# Patient Record
Sex: Female | Born: 1940 | Race: White | Hispanic: No | Marital: Married | Smoking: Never smoker
Health system: Southern US, Community
[De-identification: ages and names within clinical notes are randomized; demographics above are authoritative.]

## PROBLEM LIST (undated history)

## (undated) DIAGNOSIS — E279 Disorder of adrenal gland, unspecified: Secondary | ICD-10-CM

## (undated) DIAGNOSIS — I5189 Other ill-defined heart diseases: Secondary | ICD-10-CM

## (undated) DIAGNOSIS — C50919 Malignant neoplasm of unspecified site of unspecified female breast: Secondary | ICD-10-CM

## (undated) DIAGNOSIS — C4490 Unspecified malignant neoplasm of skin, unspecified: Secondary | ICD-10-CM

## (undated) DIAGNOSIS — J45909 Unspecified asthma, uncomplicated: Secondary | ICD-10-CM

## (undated) DIAGNOSIS — Z923 Personal history of irradiation: Secondary | ICD-10-CM

## (undated) DIAGNOSIS — A419 Sepsis, unspecified organism: Secondary | ICD-10-CM

## (undated) DIAGNOSIS — N200 Calculus of kidney: Secondary | ICD-10-CM

## (undated) DIAGNOSIS — E039 Hypothyroidism, unspecified: Secondary | ICD-10-CM

## (undated) DIAGNOSIS — I2510 Atherosclerotic heart disease of native coronary artery without angina pectoris: Secondary | ICD-10-CM

## (undated) DIAGNOSIS — K8050 Calculus of bile duct without cholangitis or cholecystitis without obstruction: Secondary | ICD-10-CM

## (undated) DIAGNOSIS — Z803 Family history of malignant neoplasm of breast: Secondary | ICD-10-CM

## (undated) DIAGNOSIS — Z87442 Personal history of urinary calculi: Secondary | ICD-10-CM

## (undated) DIAGNOSIS — I499 Cardiac arrhythmia, unspecified: Secondary | ICD-10-CM

## (undated) DIAGNOSIS — Z808 Family history of malignant neoplasm of other organs or systems: Secondary | ICD-10-CM

## (undated) DIAGNOSIS — Z7982 Long term (current) use of aspirin: Secondary | ICD-10-CM

## (undated) DIAGNOSIS — K219 Gastro-esophageal reflux disease without esophagitis: Secondary | ICD-10-CM

## (undated) DIAGNOSIS — Z8719 Personal history of other diseases of the digestive system: Secondary | ICD-10-CM

## (undated) DIAGNOSIS — Z8489 Family history of other specified conditions: Secondary | ICD-10-CM

## (undated) DIAGNOSIS — I10 Essential (primary) hypertension: Secondary | ICD-10-CM

## (undated) DIAGNOSIS — G4733 Obstructive sleep apnea (adult) (pediatric): Secondary | ICD-10-CM

## (undated) DIAGNOSIS — Z8042 Family history of malignant neoplasm of prostate: Secondary | ICD-10-CM

## (undated) DIAGNOSIS — C679 Malignant neoplasm of bladder, unspecified: Secondary | ICD-10-CM

## (undated) DIAGNOSIS — T884XXA Failed or difficult intubation, initial encounter: Secondary | ICD-10-CM

## (undated) DIAGNOSIS — B029 Zoster without complications: Secondary | ICD-10-CM

## (undated) DIAGNOSIS — R600 Localized edema: Secondary | ICD-10-CM

## (undated) DIAGNOSIS — E785 Hyperlipidemia, unspecified: Secondary | ICD-10-CM

## (undated) DIAGNOSIS — Z8052 Family history of malignant neoplasm of bladder: Secondary | ICD-10-CM

## (undated) DIAGNOSIS — E669 Obesity, unspecified: Secondary | ICD-10-CM

## (undated) DIAGNOSIS — G4730 Sleep apnea, unspecified: Secondary | ICD-10-CM

## (undated) DIAGNOSIS — N179 Acute kidney failure, unspecified: Secondary | ICD-10-CM

## (undated) DIAGNOSIS — K851 Biliary acute pancreatitis without necrosis or infection: Secondary | ICD-10-CM

## (undated) DIAGNOSIS — E278 Other specified disorders of adrenal gland: Secondary | ICD-10-CM

## (undated) DIAGNOSIS — M549 Dorsalgia, unspecified: Secondary | ICD-10-CM

## (undated) DIAGNOSIS — M1990 Unspecified osteoarthritis, unspecified site: Secondary | ICD-10-CM

## (undated) DIAGNOSIS — I700 Atherosclerosis of aorta: Secondary | ICD-10-CM

## (undated) DIAGNOSIS — I447 Left bundle-branch block, unspecified: Secondary | ICD-10-CM

## (undated) HISTORY — DX: Family history of malignant neoplasm of prostate: Z80.42

## (undated) HISTORY — DX: Family history of malignant neoplasm of breast: Z80.3

## (undated) HISTORY — DX: Family history of malignant neoplasm of bladder: Z80.52

## (undated) HISTORY — PX: SEPTOPLASTY: SHX2393

## (undated) HISTORY — PX: CARPAL TUNNEL RELEASE: SHX101

## (undated) HISTORY — DX: Calculus of bile duct without cholangitis or cholecystitis without obstruction: K80.50

## (undated) HISTORY — PX: CATARACT EXTRACTION: SUR2

## (undated) HISTORY — PX: KNEE ARTHROSCOPY: SUR90

## (undated) HISTORY — DX: Family history of malignant neoplasm of other organs or systems: Z80.8

## (undated) HISTORY — DX: Biliary acute pancreatitis without necrosis or infection: K85.10

## (undated) HISTORY — PX: UVULOPALATOPHARYNGOPLASTY: SHX827

## (undated) HISTORY — PX: TONSILLECTOMY: SUR1361

---

## 2003-04-08 DIAGNOSIS — C4490 Unspecified malignant neoplasm of skin, unspecified: Secondary | ICD-10-CM

## 2003-04-08 HISTORY — DX: Unspecified malignant neoplasm of skin, unspecified: C44.90

## 2003-10-13 ENCOUNTER — Other Ambulatory Visit: Payer: Self-pay

## 2004-07-30 ENCOUNTER — Ambulatory Visit: Payer: Self-pay | Admitting: Gastroenterology

## 2004-08-06 ENCOUNTER — Ambulatory Visit: Payer: Self-pay | Admitting: Internal Medicine

## 2004-08-12 ENCOUNTER — Ambulatory Visit: Payer: Self-pay | Admitting: Internal Medicine

## 2004-10-17 ENCOUNTER — Ambulatory Visit: Payer: Self-pay | Admitting: Urology

## 2004-12-10 ENCOUNTER — Ambulatory Visit: Payer: Self-pay | Admitting: Physician Assistant

## 2005-03-14 ENCOUNTER — Ambulatory Visit: Payer: Self-pay | Admitting: Internal Medicine

## 2005-06-16 ENCOUNTER — Ambulatory Visit: Payer: Self-pay | Admitting: Obstetrics and Gynecology

## 2006-06-23 ENCOUNTER — Ambulatory Visit: Payer: Self-pay | Admitting: Internal Medicine

## 2006-07-06 ENCOUNTER — Ambulatory Visit: Payer: Self-pay | Admitting: Internal Medicine

## 2007-01-13 ENCOUNTER — Ambulatory Visit: Payer: Self-pay | Admitting: Internal Medicine

## 2007-07-30 ENCOUNTER — Ambulatory Visit: Payer: Self-pay | Admitting: Internal Medicine

## 2008-07-19 ENCOUNTER — Ambulatory Visit: Payer: Self-pay | Admitting: Internal Medicine

## 2008-09-11 ENCOUNTER — Ambulatory Visit: Payer: Self-pay | Admitting: Urology

## 2009-01-03 ENCOUNTER — Ambulatory Visit: Payer: Self-pay | Admitting: Internal Medicine

## 2010-01-28 ENCOUNTER — Ambulatory Visit: Payer: Self-pay | Admitting: Internal Medicine

## 2010-07-29 ENCOUNTER — Ambulatory Visit: Payer: Self-pay | Admitting: General Practice

## 2010-08-09 ENCOUNTER — Ambulatory Visit: Payer: Self-pay | Admitting: General Practice

## 2010-11-27 ENCOUNTER — Ambulatory Visit: Payer: Self-pay | Admitting: Ophthalmology

## 2011-02-03 ENCOUNTER — Ambulatory Visit: Payer: Self-pay | Admitting: Internal Medicine

## 2011-04-08 DIAGNOSIS — Z923 Personal history of irradiation: Secondary | ICD-10-CM

## 2011-04-08 DIAGNOSIS — C50919 Malignant neoplasm of unspecified site of unspecified female breast: Secondary | ICD-10-CM

## 2011-04-08 HISTORY — DX: Personal history of irradiation: Z92.3

## 2011-04-08 HISTORY — PX: TOTAL HIP ARTHROPLASTY: SHX124

## 2011-04-08 HISTORY — PX: BREAST BIOPSY: SHX20

## 2011-04-08 HISTORY — PX: JOINT REPLACEMENT: SHX530

## 2011-04-08 HISTORY — DX: Malignant neoplasm of unspecified site of unspecified female breast: C50.919

## 2011-04-08 HISTORY — PX: BREAST LUMPECTOMY: SHX2

## 2011-06-09 ENCOUNTER — Emergency Department: Payer: Self-pay | Admitting: Emergency Medicine

## 2011-06-10 LAB — COMPREHENSIVE METABOLIC PANEL
Albumin: 3.4 g/dL (ref 3.4–5.0)
Alkaline Phosphatase: 77 U/L (ref 50–136)
Calcium, Total: 8 mg/dL — ABNORMAL LOW (ref 8.5–10.1)
Chloride: 96 mmol/L — ABNORMAL LOW (ref 98–107)
Co2: 26 mmol/L (ref 21–32)
Creatinine: 1.38 mg/dL — ABNORMAL HIGH (ref 0.60–1.30)
Glucose: 175 mg/dL — ABNORMAL HIGH (ref 65–99)
SGOT(AST): 49 U/L — ABNORMAL HIGH (ref 15–37)
SGPT (ALT): 55 U/L
Total Protein: 7.4 g/dL (ref 6.4–8.2)

## 2011-06-10 LAB — URINALYSIS, COMPLETE
Bilirubin,UR: NEGATIVE
Nitrite: NEGATIVE
Ph: 5 (ref 4.5–8.0)
Protein: 30
RBC,UR: 25 /HPF (ref 0–5)
Squamous Epithelial: 8
Transitional Epi: 2
WBC UR: 30 /HPF (ref 0–5)

## 2011-06-10 LAB — CBC
HCT: 43.9 % (ref 35.0–47.0)
HGB: 14.7 g/dL (ref 12.0–16.0)
MCH: 30.9 pg (ref 26.0–34.0)
MCHC: 33.5 g/dL (ref 32.0–36.0)
RDW: 14.2 % (ref 11.5–14.5)

## 2011-06-10 LAB — RAPID INFLUENZA A&B ANTIGENS

## 2012-02-11 ENCOUNTER — Ambulatory Visit: Payer: Self-pay | Admitting: Internal Medicine

## 2012-02-25 ENCOUNTER — Ambulatory Visit: Payer: Self-pay | Admitting: Internal Medicine

## 2012-03-15 ENCOUNTER — Ambulatory Visit: Payer: Self-pay | Admitting: Surgery

## 2012-03-15 DIAGNOSIS — D0512 Intraductal carcinoma in situ of left breast: Secondary | ICD-10-CM

## 2012-03-15 HISTORY — DX: Intraductal carcinoma in situ of left breast: D05.12

## 2012-03-22 LAB — PATHOLOGY REPORT

## 2012-03-25 ENCOUNTER — Ambulatory Visit: Payer: Self-pay | Admitting: Surgery

## 2012-04-05 ENCOUNTER — Ambulatory Visit: Payer: Self-pay | Admitting: Surgery

## 2012-04-07 ENCOUNTER — Ambulatory Visit: Payer: Self-pay | Admitting: Oncology

## 2012-04-22 ENCOUNTER — Ambulatory Visit: Payer: Self-pay | Admitting: Oncology

## 2012-05-08 ENCOUNTER — Ambulatory Visit: Payer: Self-pay | Admitting: Oncology

## 2012-05-14 LAB — CBC CANCER CENTER
Basophil %: 0.6 %
Eosinophil %: 3.2 %
Lymphocyte #: 1.7 x10 3/mm (ref 1.0–3.6)
MCH: 30.9 pg (ref 26.0–34.0)
MCHC: 33.8 g/dL (ref 32.0–36.0)
Neutrophil #: 6.1 x10 3/mm (ref 1.4–6.5)
Neutrophil %: 70.2 %
Platelet: 268 x10 3/mm (ref 150–440)
RDW: 13.6 % (ref 11.5–14.5)

## 2012-05-21 LAB — CBC CANCER CENTER
Basophil %: 0.5 %
HGB: 14.2 g/dL (ref 12.0–16.0)
MCH: 30.3 pg (ref 26.0–34.0)
MCV: 91 fL (ref 80–100)
Monocyte #: 0.5 x10 3/mm (ref 0.2–0.9)
Platelet: 279 x10 3/mm (ref 150–440)
RBC: 4.69 10*6/uL (ref 3.80–5.20)

## 2012-05-28 LAB — CBC CANCER CENTER
Basophil #: 0 x10 3/mm (ref 0.0–0.1)
Basophil %: 0.4 %
Eosinophil #: 0.4 x10 3/mm (ref 0.0–0.7)
HCT: 41.8 % (ref 35.0–47.0)
Lymphocyte #: 1.4 x10 3/mm (ref 1.0–3.6)
Lymphocyte %: 18 %
MCH: 30.4 pg (ref 26.0–34.0)
MCHC: 33.2 g/dL (ref 32.0–36.0)
Monocyte #: 0.6 x10 3/mm (ref 0.2–0.9)
Neutrophil %: 68.7 %
Platelet: 279 x10 3/mm (ref 150–440)
RBC: 4.57 10*6/uL (ref 3.80–5.20)
WBC: 7.7 x10 3/mm (ref 3.6–11.0)

## 2012-06-04 LAB — CBC CANCER CENTER
Basophil %: 0.3 %
Eosinophil #: 0.4 x10 3/mm (ref 0.0–0.7)
Eosinophil %: 4.9 %
HCT: 40 % (ref 35.0–47.0)
HGB: 13.5 g/dL (ref 12.0–16.0)
Lymphocyte #: 1.1 x10 3/mm (ref 1.0–3.6)
Lymphocyte %: 15.9 %
MCH: 30.7 pg (ref 26.0–34.0)
MCV: 91 fL (ref 80–100)
Neutrophil #: 5.1 x10 3/mm (ref 1.4–6.5)
Neutrophil %: 71.4 %
Platelet: 244 x10 3/mm (ref 150–440)
RBC: 4.39 10*6/uL (ref 3.80–5.20)
RDW: 14.3 % (ref 11.5–14.5)
WBC: 7.1 x10 3/mm (ref 3.6–11.0)

## 2012-06-05 ENCOUNTER — Ambulatory Visit: Payer: Self-pay | Admitting: Oncology

## 2012-06-18 LAB — CBC CANCER CENTER
Basophil #: 0 x10 3/mm (ref 0.0–0.1)
Basophil %: 0.3 %
Eosinophil #: 0.4 x10 3/mm (ref 0.0–0.7)
Eosinophil %: 5.9 %
HCT: 42.1 % (ref 35.0–47.0)
HGB: 14.2 g/dL (ref 12.0–16.0)
Lymphocyte #: 0.8 x10 3/mm — ABNORMAL LOW (ref 1.0–3.6)
MCHC: 33.7 g/dL (ref 32.0–36.0)
Monocyte #: 0.5 x10 3/mm (ref 0.2–0.9)
Monocyte %: 8.1 %
Neutrophil #: 4.7 x10 3/mm (ref 1.4–6.5)
Platelet: 257 x10 3/mm (ref 150–440)
RBC: 4.6 10*6/uL (ref 3.80–5.20)
WBC: 6.4 x10 3/mm (ref 3.6–11.0)

## 2012-06-25 LAB — CBC CANCER CENTER
Basophil #: 0 x10 3/mm (ref 0.0–0.1)
Basophil %: 0.3 %
Eosinophil #: 0.4 x10 3/mm (ref 0.0–0.7)
Lymphocyte #: 0.7 x10 3/mm — ABNORMAL LOW (ref 1.0–3.6)
MCHC: 34.4 g/dL (ref 32.0–36.0)
MCV: 91 fL (ref 80–100)
Monocyte #: 0.5 x10 3/mm (ref 0.2–0.9)
Neutrophil #: 4.3 x10 3/mm (ref 1.4–6.5)
Neutrophil %: 72.3 %
RDW: 14.2 % (ref 11.5–14.5)

## 2012-07-06 ENCOUNTER — Ambulatory Visit: Payer: Self-pay | Admitting: Oncology

## 2012-09-14 ENCOUNTER — Ambulatory Visit: Payer: Self-pay | Admitting: Oncology

## 2012-11-15 ENCOUNTER — Ambulatory Visit: Payer: Self-pay | Admitting: Oncology

## 2012-12-06 ENCOUNTER — Ambulatory Visit: Payer: Self-pay | Admitting: Oncology

## 2013-01-05 ENCOUNTER — Ambulatory Visit: Payer: Self-pay | Admitting: Oncology

## 2013-02-15 ENCOUNTER — Ambulatory Visit: Payer: Self-pay | Admitting: Internal Medicine

## 2013-05-23 ENCOUNTER — Ambulatory Visit: Payer: Self-pay | Admitting: Oncology

## 2013-06-08 ENCOUNTER — Ambulatory Visit: Payer: Self-pay | Admitting: Oncology

## 2013-06-08 LAB — CBC CANCER CENTER
Basophil #: 0.1 x10 3/mm (ref 0.0–0.1)
Basophil %: 0.9 %
Eosinophil #: 0.3 x10 3/mm (ref 0.0–0.7)
Eosinophil %: 3.6 %
HCT: 42.5 % (ref 35.0–47.0)
HGB: 13.9 g/dL (ref 12.0–16.0)
Lymphocyte #: 1.7 x10 3/mm (ref 1.0–3.6)
Lymphocyte %: 21.8 %
MCH: 30.2 pg (ref 26.0–34.0)
MCHC: 32.7 g/dL (ref 32.0–36.0)
MCV: 92 fL (ref 80–100)
MONO ABS: 0.7 x10 3/mm (ref 0.2–0.9)
MONOS PCT: 9 %
NEUTROS PCT: 64.7 %
Neutrophil #: 5.1 x10 3/mm (ref 1.4–6.5)
Platelet: 274 x10 3/mm (ref 150–440)
RBC: 4.6 10*6/uL (ref 3.80–5.20)
RDW: 14 % (ref 11.5–14.5)
WBC: 8 x10 3/mm (ref 3.6–11.0)

## 2013-06-08 LAB — COMPREHENSIVE METABOLIC PANEL
ALBUMIN: 3.6 g/dL (ref 3.4–5.0)
ALT: 48 U/L (ref 12–78)
Alkaline Phosphatase: 102 U/L
Anion Gap: 8 (ref 7–16)
BILIRUBIN TOTAL: 0.3 mg/dL (ref 0.2–1.0)
BUN: 16 mg/dL (ref 7–18)
CHLORIDE: 102 mmol/L (ref 98–107)
CREATININE: 1.14 mg/dL (ref 0.60–1.30)
Calcium, Total: 8.6 mg/dL (ref 8.5–10.1)
Co2: 33 mmol/L — ABNORMAL HIGH (ref 21–32)
EGFR (African American): 55 — ABNORMAL LOW
EGFR (Non-African Amer.): 47 — ABNORMAL LOW
GLUCOSE: 120 mg/dL — AB (ref 65–99)
OSMOLALITY: 287 (ref 275–301)
Potassium: 3.9 mmol/L (ref 3.5–5.1)
SGOT(AST): 34 U/L (ref 15–37)
Sodium: 143 mmol/L (ref 136–145)
TOTAL PROTEIN: 7.4 g/dL (ref 6.4–8.2)

## 2013-07-06 ENCOUNTER — Ambulatory Visit: Payer: Self-pay | Admitting: Oncology

## 2013-12-14 ENCOUNTER — Ambulatory Visit: Payer: Self-pay | Admitting: Oncology

## 2013-12-14 LAB — COMPREHENSIVE METABOLIC PANEL
ALT: 56 U/L
ANION GAP: 10 (ref 7–16)
Albumin: 3.6 g/dL (ref 3.4–5.0)
Alkaline Phosphatase: 111 U/L
BILIRUBIN TOTAL: 0.4 mg/dL (ref 0.2–1.0)
BUN: 13 mg/dL (ref 7–18)
CHLORIDE: 101 mmol/L (ref 98–107)
CREATININE: 1.18 mg/dL (ref 0.60–1.30)
Calcium, Total: 8.8 mg/dL (ref 8.5–10.1)
Co2: 32 mmol/L (ref 21–32)
EGFR (African American): 52 — ABNORMAL LOW
EGFR (Non-African Amer.): 45 — ABNORMAL LOW
GLUCOSE: 111 mg/dL — AB (ref 65–99)
OSMOLALITY: 286 (ref 275–301)
Potassium: 4 mmol/L (ref 3.5–5.1)
SGOT(AST): 37 U/L (ref 15–37)
SODIUM: 143 mmol/L (ref 136–145)
TOTAL PROTEIN: 7.7 g/dL (ref 6.4–8.2)

## 2013-12-14 LAB — CBC CANCER CENTER
Basophil #: 0.1 x10 3/mm (ref 0.0–0.1)
Basophil %: 0.7 %
EOS ABS: 0.4 x10 3/mm (ref 0.0–0.7)
Eosinophil %: 4.2 %
HCT: 44.4 % (ref 35.0–47.0)
HGB: 14.5 g/dL (ref 12.0–16.0)
Lymphocyte #: 1.8 x10 3/mm (ref 1.0–3.6)
Lymphocyte %: 20.3 %
MCH: 30.5 pg (ref 26.0–34.0)
MCHC: 32.7 g/dL (ref 32.0–36.0)
MCV: 93 fL (ref 80–100)
MONO ABS: 0.7 x10 3/mm (ref 0.2–0.9)
Monocyte %: 7.6 %
NEUTROS PCT: 67.2 %
Neutrophil #: 6 x10 3/mm (ref 1.4–6.5)
PLATELETS: 285 x10 3/mm (ref 150–440)
RBC: 4.77 10*6/uL (ref 3.80–5.20)
RDW: 14.2 % (ref 11.5–14.5)
WBC: 8.9 x10 3/mm (ref 3.6–11.0)

## 2014-01-05 ENCOUNTER — Ambulatory Visit: Payer: Self-pay | Admitting: Oncology

## 2014-03-07 ENCOUNTER — Ambulatory Visit: Payer: Self-pay | Admitting: Internal Medicine

## 2014-06-14 ENCOUNTER — Ambulatory Visit
Admit: 2014-06-14 | Disposition: A | Discharge status: Home / Self Care | Payer: Self-pay | Attending: Oncology | Admitting: Oncology

## 2014-07-07 ENCOUNTER — Ambulatory Visit
Admit: 2014-07-07 | Disposition: A | Discharge status: Home / Self Care | Payer: Self-pay | Attending: Oncology | Admitting: Oncology

## 2014-07-21 ENCOUNTER — Other Ambulatory Visit: Payer: Self-pay | Admitting: Oncology

## 2014-07-21 DIAGNOSIS — Z853 Personal history of malignant neoplasm of breast: Secondary | ICD-10-CM

## 2014-07-28 NOTE — Consult Note (Signed)
Reason for Visit: This 74 year old Female patient presents to the clinic for initial evaluation of  breast cancer .   Referred by Dr. Oliva Bustard.  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   Pathology Report pathology report reviewed    Imaging Report mammogram reviewed    Referral Report linical notes reviewed    Planned Treatment Regimen adjuvant whole breast radiation    HPI   patient is a 74 year old female who presents with an abnormal mammogram of her left breast showing new microcalcifications clusters in the left breast. 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 She then underwent a wide local excision for a 3.5 cm lesion for ductal carcinoma in situ. Margins were clear but close at 1 mm. She is done well postoperatively. She's been evaluated by medical oncology and she will be on tamoxifen after completion of radiation. She specifically denies breast tenderness cough or bone pain at this time.  Past Hx:    Left Breast Cancer:    Right Breast Cancer:    Sleep Apnea/CPAP:    Hypertension:    Left Hip Replacement:    Carpal Tunnel Release, Right:    Cataract Surgery Left Eye:    Excision squamousncell carcinoma X 4:    Cataract surgery right eye:    Left hip replacement: Jul 2008   Right knee arthroscopy: 2005   Surgery throat and tongue: 1999  Past, Family and Social History:   Past Medical History positive    Cardiovascular hypertension    Respiratory sleep apnea    Past Surgical History cataract right eye, left hip replacement, right knee arthroscopic, head and neck surgery 1999    Family History positive    Family History Comments mother with breast cancer brother with colon polyps    Social History noncontributory    Additional Past Medical and  Surgical History accompanied by husband today   Allergies:   Sulfa drugs: N/V/Diarrhea  Levaquin: Other  Vasotec: Unknown  Home Meds:  Home Medications: Medication Instructions Status  calcium-vitamin D 600 mg-400 intl units tablet 1 tab(s) orally once a day Active  levothyroxine 75 mcg (0.05 mg) tablet 1 tab(s)  once a day Active  Vitamin B12 1000 mcg oral tablet 1 tab(s) orally once a day Active  Geophysicist/field seismologist Therapeutic Multiple Vitamins with Minerals tablet 1 tab(s) orally once a day  Active  furosemide 20 mg tablet 1 tab(s) orally once a day as needed   Active  lovastatin 40 mg tablet 1 tab(s) orally once a day (at bedtime)  Active  pantoprazole 40 mg delayed release tablet 1 tab(s) orally once a day (at bedtime)  Active  hydrochlorothiazide 25 mg tablet 1 tab(s) orally once a day  Active  losartan 100 mg tablet 1 tab(s) orally once a day (in the morning)  Active  Metoprolol Tartrate 100 mg tablet 1 tab(s) orally 2 times a day  Active  Aspirin Low Dose 81 mg tablet 1 tab(s) orally once a day (at bedtime)  Active  Klor-Con 10 10 mEq tablet, extended release 1 tab(s) orally once a day as needed  when taking furosemide Active  Vitamin D3 1000 intl units oral tablet 1 tab(s) orally once a day  Active  Klor-Con M20 oral tablet, extended release 1 tab(s) orally 2 times a day  Active  loratadine 10 mg tablet 1 tab(s) orally once a day  Active  amLODIPine 10 mg tablet 1 tab(s) orally once a day (in the morning)  Active  Omega 3 Fish Oil 1 cap(s) orally once a day  Active   Review of Systems:   General negative    Performance Status (ECOG) 0    Skin negative    Breast see HPI    Ophthalmologic negative    ENMT negative    Respiratory and Thorax negative    Cardiovascular negative    Gastrointestinal negative    Genitourinary negative    Musculoskeletal negative    Neurological negative    Psychiatric negative    Hematology/Lymphatics negative    Endocrine  negative    Allergic/Immunologic negative    Review of Systems   according to nurse's notesPatient denies any weight loss, fatigue, weakness, fever, chills or night sweats. Patient denies any loss of vision, blurred vision. Patient denies any ringing  of the ears or hearing loss. No irregular heartbeat. Patient denies heart murmur or history of fainting. Patient denies any chest pain or pain radiating to her upper extremities. Patient denies any shortness of breath, difficulty breathing at night, cough or hemoptysis. Patient denies any swelling in the lower legs. Patient denies any nausea vomiting, vomiting of blood, or coffee ground material in the vomitus. Patient denies any stomach pain. Patient states has had normal bowel movements no significant constipation or diarrhea. Patient denies any dysuria, hematuria or significant nocturia. Patient denies any problems walking, swelling in the joints or loss of balance. Patient denies any skin changes, loss of hair or loss of weight. Patient denies any excessive worrying or anxiety or significant depression. Patient denies any problems with insomnia. Patient denies excessive thirst, polyuria, polydipsia. Patient denies any swollen glands, patient denies easy bruising or easy bleeding. Patient denies any recent infections, allergies or URI. Patient "s visual fields have not changed significantly in recent time.  Nursing Notes:  Nursing Vital Signs and Chemo Nursing Nursing Notes: *CC Vital Signs Flowsheet:   23-Jan-14 13:34   Temp Temperature 97.7   Pulse Pulse 65   Respirations Respirations 20   SBP SBP 148   DBP DBP 52   Pain Scale (0-10)  0   Current Weight (kg) (kg) 94.2   Height (cm) centimeters 155   BSA (m2) 1.9   Physical Exam:  General/Skin/HEENT:   General normal    Skin normal    Eyes normal    ENMT normal    Head and Neck normal    Additional PE well-developed slightly obese female in NAD. She status post wide local excision  the peri-areolar region of the left breast. Incision is well-healed. No dominant mass or nodularity is noted in either breast into position examined. No axillary or supraclavicular adenopathy is appreciated. Lungs are clear to A&P cardiac examination shows regular rate and rhythm.   Breasts/Resp/CV/GI/GU:   Respiratory and Thorax normal    Cardiovascular normal    Gastrointestinal normal    Genitourinary normal   MS/Neuro/Psych/Lymph:   Musculoskeletal normal    Neurological normal    Lymphatics normal   Assessment and Plan:  Impression:   ductal carcinoma in situ of the left breast status post wide local excision for ER/PR-positive tumor in 74 year old female  Plan:   based on the large volume of DCIS and close margin believe she would best be benefit from whole breast radiation. Would plan on delivering 5000 cGy to her  left breast and boost or scar another 1600 cGy using electron beam based on the close margin. Risks and benefits of treatment were reviewed with the patient and her husband and both seem to comprehend my treatment plan well. Side effects including skin changes, fibrosis of the breast, inclusion of some normal superficial lung, alteration blood counts, and fatigue were all explained in detail to the patient. I have set her up for CT simulation next week.she will be a candidate for tamoxifen after completion of radiation therapy we will refer her back to Dr. Oliva Bustard for that.  I would like to take this opportunity to thank you for allowing me to continue to participate in this patient's care.    CC Referral:   cc: Dr. Tamala Julian, Dr. Ramonita Lab   Electronic Signatures: Baruch Gouty Roda Shutters (MD)  (Signed 23-Jan-14 15:24)  Authored: HPI, Diagnosis, Past Hx, PFSH, Allergies, Home Meds, ROS, Nursing Notes, Physical Exam, Encounter Assessment and Plan, CC Referring Physician   Last Updated: 23-Jan-14 15:24 by Armstead Peaks (MD)

## 2014-07-28 NOTE — Op Note (Signed)
PATIENT NAME:  Alyssa Duncan, Alyssa Duncan MR#:  [id number] DATE OF BIRTH:  Dec 08, 1940  DATE OF PROCEDURE:  04/05/2012  PREOPERATIVE DIAGNOSIS: Carcinoma of the left breast.   POSTOPERATIVE DIAGNOSIS: Carcinoma of the left breast.   PROCEDURE: Left partial mastectomy.   SURGEON: Rochel Brome, MD  ANESTHESIA: General.   INDICATION: This 74 year old female recently had an abnormal mammogram and had needle biopsy findings of ductal carcinoma in situ and lobular carcinoma in situ. Surgery was recommended for definitive treatment. She had preoperative x-ray needle localization with insertion of a Kopans wire which entered the breast, at the medial aspect of the breast.   DESCRIPTION OF PROCEDURE: With the patient on the operating table, in the supine position, under general anesthesia, the dressing was removed from the left breast exposing the Kopans wire. The wire was cut 2 cm from the skin and the breast was prepared with ChloraPrep and draped in a sterile manner. The mammogram images were reviewed on a light box in the operating room prior to surgery demonstrating location of the biopsy marker and the Kopans wire.   A curvilinear incision was made, in the medial aspect, of the left breast adjacent to the border of the areola and the incision extended from the 7 o'clock to the 11 o'clock position and also approximately 1 cm width of skin was removed in the course of this dissection. Dissection was carried down through subcutaneous tissues and was carried laterally down to the wire and as the dissection approached the wire there was some firmness appreciated within the breast surrounding the thick portion of the wire. Further dissection was carried out removing a block of breast tissue surrounding the wire while palpating the area of firmness and there was one point where the dissection came close to the wire and backed up and extended more laterally and dissected completely around the wire and removed the sample of  tissue. It is further noted that the 11 o'clock end of the skin ellipse was tagged with a 3-0 nylon suture. There was one point where the dissection had been carried down close to the biopsy site and backed up and the tissues were reapproximated at this point with a 3-0 nylon stitch to give the pathologist a better idea of the margins. Also the margins were tagged with the margin map placing the margin map markers on the specimen with 3-0 nylon sutures to mark the cranial, caudal, medial, lateral and deep margins. The specimen was submitted for specimen mammogram and pathology for gross inspection to check for margins. The wound was inspected. There was no remaining evidence of any tumor within the wound. It is noted that the tumor did extend somewhat posterior to the nipple. Next, several small bleeding points were cauterized. One clamped vessel was suture-ligated with 4-0 chromic. Deeper tissues were infiltrated with 0.5% Sensorcaine with epinephrine and also subcutaneous tissues were infiltrated and the wound was further inspected. There was no visible or palpable mass remaining and hemostasis was subsequently intact. Next, portions of the breast tissue were approximated with 4-0 chromic and tissues posterior to the nipple were approximated with 4-0 chromic to help prevent architectural distortion and help prevent inversion. Next, the wound was closed with a running 4-0 Monocryl subcuticular suture.   The pathologist called back at a later time and had studied and identified the biopsy site. There was some nodularity in some surface areas although it was unclear whether there was any involvement of margins and specimen was deferred for routine pathology.  Next, the skin was treated with Dermabond. The patient tolerated the procedure satisfactorily and was prepared for transfer to the recovery room.  ____________________________ Lenna Sciara. Rochel Brome, MD jws:sb D: 04/06/2012 09:25:28 ET T: 04/06/2012 10:28:39  ET JOB#: [id number]  cc: Loreli Dollar, MD, <Dictator> Loreli Dollar MD ELECTRONICALLY SIGNED 04/08/2012 16:08

## 2015-03-12 ENCOUNTER — Other Ambulatory Visit: Payer: Self-pay

## 2015-03-22 ENCOUNTER — Other Ambulatory Visit: Payer: Self-pay | Admitting: Oncology

## 2015-03-22 ENCOUNTER — Ambulatory Visit
Admission: RE | Admit: 2015-03-22 | Discharge: 2015-03-22 | Disposition: A | Discharge status: Home / Self Care | Payer: Medicare PPO | Source: Ambulatory Visit | Attending: Oncology | Admitting: Oncology

## 2015-03-22 DIAGNOSIS — Z853 Personal history of malignant neoplasm of breast: Secondary | ICD-10-CM

## 2015-03-22 HISTORY — DX: Unspecified malignant neoplasm of skin, unspecified: C44.90

## 2015-03-22 HISTORY — DX: Malignant neoplasm of unspecified site of unspecified female breast: C50.919

## 2015-04-08 DIAGNOSIS — I480 Paroxysmal atrial fibrillation: Secondary | ICD-10-CM

## 2015-04-08 HISTORY — DX: Paroxysmal atrial fibrillation: I48.0

## 2015-05-09 ENCOUNTER — Encounter: Payer: Self-pay | Admitting: *Deleted

## 2015-05-10 ENCOUNTER — Ambulatory Visit
Admission: RE | Admit: 2015-05-10 | Discharge: 2015-05-10 | Disposition: A | Discharge status: Home / Self Care | Payer: Medicare Other | Source: Ambulatory Visit | Attending: Gastroenterology | Admitting: Gastroenterology

## 2015-05-10 ENCOUNTER — Ambulatory Visit: Payer: Medicare Other | Admitting: Anesthesiology

## 2015-05-10 ENCOUNTER — Encounter: Payer: Self-pay | Admitting: *Deleted

## 2015-05-10 ENCOUNTER — Encounter
Admission: RE | Disposition: A | Discharge status: Home / Self Care | Payer: Self-pay | Source: Ambulatory Visit | Attending: Gastroenterology

## 2015-05-10 DIAGNOSIS — Z85828 Personal history of other malignant neoplasm of skin: Secondary | ICD-10-CM | POA: Diagnosis not present

## 2015-05-10 DIAGNOSIS — E785 Hyperlipidemia, unspecified: Secondary | ICD-10-CM | POA: Diagnosis not present

## 2015-05-10 DIAGNOSIS — Z853 Personal history of malignant neoplasm of breast: Secondary | ICD-10-CM | POA: Insufficient documentation

## 2015-05-10 DIAGNOSIS — E039 Hypothyroidism, unspecified: Secondary | ICD-10-CM | POA: Insufficient documentation

## 2015-05-10 DIAGNOSIS — Z79899 Other long term (current) drug therapy: Secondary | ICD-10-CM | POA: Diagnosis not present

## 2015-05-10 DIAGNOSIS — M1990 Unspecified osteoarthritis, unspecified site: Secondary | ICD-10-CM | POA: Insufficient documentation

## 2015-05-10 DIAGNOSIS — Z9660 Presence of unspecified orthopedic joint implant: Secondary | ICD-10-CM | POA: Insufficient documentation

## 2015-05-10 DIAGNOSIS — I10 Essential (primary) hypertension: Secondary | ICD-10-CM | POA: Insufficient documentation

## 2015-05-10 DIAGNOSIS — E119 Type 2 diabetes mellitus without complications: Secondary | ICD-10-CM | POA: Diagnosis not present

## 2015-05-10 DIAGNOSIS — G4730 Sleep apnea, unspecified: Secondary | ICD-10-CM | POA: Insufficient documentation

## 2015-05-10 DIAGNOSIS — K219 Gastro-esophageal reflux disease without esophagitis: Secondary | ICD-10-CM | POA: Diagnosis not present

## 2015-05-10 DIAGNOSIS — J45909 Unspecified asthma, uncomplicated: Secondary | ICD-10-CM | POA: Diagnosis not present

## 2015-05-10 DIAGNOSIS — Z1211 Encounter for screening for malignant neoplasm of colon: Secondary | ICD-10-CM | POA: Insufficient documentation

## 2015-05-10 DIAGNOSIS — D122 Benign neoplasm of ascending colon: Secondary | ICD-10-CM | POA: Diagnosis not present

## 2015-05-10 HISTORY — DX: Gastro-esophageal reflux disease without esophagitis: K21.9

## 2015-05-10 HISTORY — DX: Zoster without complications: B02.9

## 2015-05-10 HISTORY — PX: COLONOSCOPY WITH PROPOFOL: SHX5780

## 2015-05-10 HISTORY — DX: Sleep apnea, unspecified: G47.30

## 2015-05-10 HISTORY — DX: Hyperlipidemia, unspecified: E78.5

## 2015-05-10 HISTORY — DX: Unspecified osteoarthritis, unspecified site: M19.90

## 2015-05-10 HISTORY — DX: Unspecified asthma, uncomplicated: J45.909

## 2015-05-10 HISTORY — DX: Dorsalgia, unspecified: M54.9

## 2015-05-10 HISTORY — DX: Essential (primary) hypertension: I10

## 2015-05-10 HISTORY — DX: Hypothyroidism, unspecified: E03.9

## 2015-05-10 SURGERY — COLONOSCOPY WITH PROPOFOL
Anesthesia: General

## 2015-05-10 MED ORDER — SODIUM CHLORIDE 0.9 % IV SOLN
INTRAVENOUS | Status: DC
  Administered 2015-05-10: 1000 mL via INTRAVENOUS

## 2015-05-10 MED ORDER — PROPOFOL 500 MG/50ML IV EMUL
INTRAVENOUS | Status: DC | PRN
  Administered 2015-05-10: 100 ug/kg/min via INTRAVENOUS

## 2015-05-10 MED ORDER — LIDOCAINE HCL (CARDIAC) 20 MG/ML IV SOLN
INTRAVENOUS | Status: DC | PRN
  Administered 2015-05-10: 30 mg via INTRAVENOUS

## 2015-05-10 MED ORDER — SODIUM CHLORIDE 0.9 % IV SOLN: INTRAVENOUS | Status: DC

## 2015-05-10 MED ORDER — PROPOFOL 10 MG/ML IV BOLUS
INTRAVENOUS | Status: DC | PRN
  Administered 2015-05-10: 100 mg via INTRAVENOUS
  Administered 2015-05-10: 40 mg via INTRAVENOUS

## 2015-05-10 NOTE — H&P (Signed)
Primary Care Physician:  Adin Hector, MD Primary Gastroenterologist:  Dr. Candace Cruise  Pre-Procedure History & Physical: HPI:  Alyssa Duncan is a 75 y.o. female is here for an colonoscopy  Past Medical History  Diagnosis Date  . Breast cancer ([facility]) 2013    left breast ductal and lobular carcinoma  . Skin cancer   . Arthritis   . Asthma   . Back pain   . Hypertension   . GERD (gastroesophageal reflux disease)   . Diabetes mellitus without complication ([place])   . Hyperlipidemia   . Sleep apnea   . Hypothyroidism   . Shingles     Past Surgical History  Procedure Laterality Date  . Breast lumpectomy Left 2013    with rad tx  . Uvulopalatopharyngoplasty      and tongue surgery  . Septoplasty    . Joint replacement    . Knee arthroscopy    . Cataract extraction    . Mastectomy    . Carpal tunnel release      Prior to Admission medications   Medication Sig Start Date End Date Taking? Authorizing Provider  calcium carbonate (OSCAL) 1500 (600 Ca) MG TABS tablet Take 1,500 mg by mouth 2 (two) times daily with a meal.   Yes Historical Provider, MD  DORZOLAMIDE HCL-TIMOLOL MAL OP Place 1 drop into both eyes 2 (two) times daily.   Yes Historical Provider, MD  exemestane (AROMASIN) 25 MG tablet Take 25 mg by mouth daily after breakfast.   Yes Historical Provider, MD  furosemide (LASIX) 20 MG tablet Take 20 mg by mouth daily as needed.   Yes Historical Provider, MD  hydrochlorothiazide (HYDRODIURIL) 25 MG tablet Take 25 mg by mouth daily.   Yes Historical Provider, MD  ibuprofen (ADVIL,MOTRIN) 200 MG tablet Take 200 mg by mouth every 6 (six) hours as needed.   Yes Historical Provider, MD  levothyroxine (SYNTHROID, LEVOTHROID) 75 MCG tablet Take 75 mcg by mouth daily before breakfast.   Yes Historical Provider, MD  loratadine (CLARITIN) 10 MG tablet Take 10 mg by mouth daily.   Yes Historical Provider, MD  losartan (COZAAR) 100 MG tablet Take 100 mg by mouth daily.   Yes Historical  Provider, MD  lovastatin (MEVACOR) 40 MG tablet Take 40 mg by mouth at bedtime.   Yes Historical Provider, MD  metoprolol succinate (TOPROL-XL) 100 MG 24 hr tablet Take 100 mg by mouth 2 (two) times daily. Take with or immediately following a meal.   Yes Historical Provider, MD  Multiple Vitamin (MULTIVITAMIN) tablet Take 1 tablet by mouth daily.   Yes Historical Provider, MD  pantoprazole (PROTONIX) 40 MG tablet Take 40 mg by mouth daily.   Yes Historical Provider, MD  potassium chloride SA (K-DUR,KLOR-CON) 20 MEQ tablet Take 20 mEq by mouth 2 (two) times daily.   Yes Historical Provider, MD    Allergies as of 04/30/2015  . (Not on File)    Family History  Problem Relation Age of Onset  . Breast cancer Mother 63    Social History   Social History  . Marital Status: Married    Spouse Name: N/A  . Number of Children: N/A  . Years of Education: N/A   Occupational History  . Not on file.   Social History Main Topics  . Smoking status: Never Smoker   . Smokeless tobacco: Never Used  . Alcohol Use: No  . Drug Use: No  . Sexual Activity: Not on file  Other Topics Concern  . Not on file   Social History Narrative    Review of Systems: See HPI, otherwise negative ROS  Physical Exam: BP 143/71 mmHg  Pulse 65  Temp(Src) 97.6 F (36.4 C) (Tympanic)  Resp 18  Ht 5\' 2"  (1.575 m)  Wt 91.173 kg (201 lb)  BMI 36.75 kg/m2  SpO2 98% General:   Alert,  pleasant and cooperative in NAD Head:  Normocephalic and atraumatic. Neck:  Supple; no masses or thyromegaly. Lungs:  Clear throughout to auscultation.    Heart:  Regular rate and rhythm. Abdomen:  Soft, nontender and nondistended. Normal bowel sounds, without guarding, and without rebound.   Neurologic:  Alert and  oriented x4;  grossly normal neurologically.  Impression/Plan: Alyssa Duncan is here for an colonoscopy to be performed for screening.  Risks, benefits, limitations, and alternatives regarding colonoscopy}  have been reviewed with the patient.  Questions have been answered.  All parties agreeable.   Hiran Leard, Lupita Dawn, MD  05/10/2015, 9:59 AM

## 2015-05-10 NOTE — Transfer of Care (Signed)
Immediate Anesthesia Transfer of Care Note  Patient: Alyssa Duncan  Procedure(s) Performed: Procedure(s): COLONOSCOPY WITH PROPOFOL (N/A)  Patient Location: Endoscopy Unit  Anesthesia Type:General  Level of Consciousness: sedated  Airway & Oxygen Therapy: Patient Spontanous Breathing and Patient connected to nasal cannula oxygen  Post-op Assessment: Report given to RN and Post -op Vital signs reviewed and stable  Post vital signs: Reviewed and stable  Last Vitals:  Filed Vitals:   05/10/15 0934 05/10/15 1034  BP: 143/71 106/41  Pulse: 65 68  Temp: 36.4 C 36 C  Resp: 18 19    Complications: No apparent anesthesia complications

## 2015-05-10 NOTE — Op Note (Signed)
[facility] Gastroenterology Patient Name: Alyssa Duncan Procedure Date: 05/10/2015 9:56 AM MRN: [id number] Account #: [id number] Date of Birth: 06/05/1940 Admit Type: Outpatient Age: 75 Room: [facility] ENDO ROOM 4 Gender: Female Note Status: Finalized Procedure:         Colonoscopy Indications:       Screening for colorectal malignant neoplasm Providers:         Lupita Dawn. Candace Cruise, MD Referring MD:      Ramonita Lab, MD (Referring MD) Medicines:         Monitored Anesthesia Care Complications:     No immediate complications. Procedure:         Pre-Anesthesia Assessment:                    - Prior to the procedure, a History and Physical was                     performed, and patient medications, allergies and                     sensitivities were reviewed. The patient's tolerance of                     previous anesthesia was reviewed.                    - The risks and benefits of the procedure and the sedation                     options and risks were discussed with the patient. All                     questions were answered and informed consent was obtained.                    - After reviewing the risks and benefits, the patient was                     deemed in satisfactory condition to undergo the procedure.                    After obtaining informed consent, the colonoscope was                     passed under direct vision. Throughout the procedure, the                     patient's blood pressure, pulse, and oxygen saturations                     were monitored continuously. The Colonoscope was                     introduced through the anus and advanced to the the cecum,                     identified by appendiceal orifice and ileocecal valve. The                     colonoscopy was performed without difficulty. The patient                     tolerated the procedure well. The quality of the bowel  preparation was good. Findings:      A small  polyp was found in the ascending colon. The polyp was sessile.       The polyp was removed with a cold snare. Resection and retrieval were       complete.      The exam was otherwise without abnormality. Impression:        - One small polyp in the ascending colon. Resected and                     retrieved.                    - The examination was otherwise normal. Recommendation:    - Discharge patient to home.                    - Await pathology results.                    - Repeat colonoscopy in 5 years for surveillance based on                     pathology results.                    - The findings and recommendations were discussed with the                     patient. Procedure Code(s): --- Professional ---                    [phone], Colonoscopy, flexible; with removal of tumor(s),                     polyp(s), or other lesion(s) by snare technique Diagnosis Code(s): --- Professional ---                    Z12.11, Encounter for screening for malignant neoplasm of                     colon                    D12.2, Benign neoplasm of ascending colon CPT copyright 2014 American Medical Association. All rights reserved. The codes documented in this report are preliminary and upon coder review may  be revised to meet current compliance requirements. Hulen Luster, MD 05/10/2015 10:33:11 AM This report has been signed electronically. Number of Addenda: 0 Note Initiated On: 05/10/2015 9:56 AM Scope Withdrawal Time: 0 hours 9 minutes 7 seconds  Total Procedure Duration: 0 hours 18 minutes 4 seconds       [facility]

## 2015-05-10 NOTE — Anesthesia Postprocedure Evaluation (Signed)
Anesthesia Post Note  Patient: Alyssa Duncan  Procedure(s) Performed: Procedure(s) (LRB): COLONOSCOPY WITH PROPOFOL (N/A)  Patient location during evaluation: Endoscopy Anesthesia Type: General Level of consciousness: awake and alert Pain management: pain level controlled Vital Signs Assessment: post-procedure vital signs reviewed and stable Respiratory status: spontaneous breathing, nonlabored ventilation, respiratory function stable and patient connected to nasal cannula oxygen Cardiovascular status: blood pressure returned to baseline and stable Postop Assessment: no signs of nausea or vomiting Anesthetic complications: no    Last Vitals:  Filed Vitals:   05/10/15 1100 05/10/15 1110  BP: 104/44 105/48  Pulse:    Temp:    Resp:      Last Pain: There were no vitals filed for this visit.               Martha Clan

## 2015-05-10 NOTE — Anesthesia Preprocedure Evaluation (Signed)
Anesthesia Evaluation  Patient identified by MRN, date of birth, ID band Patient awake    Reviewed: Allergy & Precautions, H&P , NPO status , Patient's Chart, lab work & pertinent test results, reviewed documented beta blocker date and time   Airway Mallampati: III  TM Distance: >3 FB Neck ROM: full    Dental no notable dental hx. (+) Caps   Pulmonary shortness of breath and with exertion, asthma , sleep apnea and Continuous Positive Airway Pressure Ventilation , neg COPD, neg recent URI,    Pulmonary exam normal breath sounds clear to auscultation       Cardiovascular Exercise Tolerance: Good hypertension, On Medications and On Home Beta Blockers (-) angina(-) CAD, (-) Past MI, (-) Cardiac Stents and (-) CABG Normal cardiovascular exam(-) dysrhythmias (-) Valvular Problems/Murmurs Rhythm:regular Rate:Normal     Neuro/Psych negative neurological ROS  negative psych ROS   GI/Hepatic Neg liver ROS, GERD  Medicated,  Endo/Other  diabetes (Borderline)Hypothyroidism   Renal/GU negative Renal ROS  negative genitourinary   Musculoskeletal   Abdominal   Peds  Hematology negative hematology ROS (+)   Anesthesia Other Findings Past Medical History:   Breast cancer ([place])                             2013           Comment:left breast ductal and lobular carcinoma   Skin cancer                                                  Arthritis                                                    Asthma                                                       Back pain                                                    Hypertension                                                 GERD (gastroesophageal reflux disease)                       Diabetes mellitus without complication ([facility])                 Hyperlipidemia  Sleep apnea                                                  Hypothyroidism                                                Shingles                                                     Reproductive/Obstetrics negative OB ROS                             Anesthesia Physical Anesthesia Plan  ASA: III  Anesthesia Plan: General   Post-op Pain Management:    Induction:   Airway Management Planned:   Additional Equipment:   Intra-op Plan:   Post-operative Plan:   Informed Consent: I have reviewed the patients History and Physical, chart, labs and discussed the procedure including the risks, benefits and alternatives for the proposed anesthesia with the patient or authorized representative who has indicated his/her understanding and acceptance.   Dental Advisory Given  Plan Discussed with: Anesthesiologist, CRNA and Surgeon  Anesthesia Plan Comments:         Anesthesia Quick Evaluation

## 2015-05-11 ENCOUNTER — Encounter: Payer: Self-pay | Admitting: Gastroenterology

## 2015-05-11 LAB — SURGICAL PATHOLOGY

## 2015-05-17 DIAGNOSIS — Z9889 Other specified postprocedural states: Secondary | ICD-10-CM | POA: Insufficient documentation

## 2015-05-17 DIAGNOSIS — Z4889 Encounter for other specified surgical aftercare: Secondary | ICD-10-CM | POA: Insufficient documentation

## 2015-06-13 ENCOUNTER — Ambulatory Visit: Payer: Self-pay | Admitting: Oncology

## 2015-06-13 ENCOUNTER — Inpatient Hospital Stay: Payer: Medicare Other

## 2015-06-13 ENCOUNTER — Inpatient Hospital Stay: Payer: Medicare Other | Attending: Oncology | Admitting: Oncology

## 2015-06-13 ENCOUNTER — Encounter: Payer: Self-pay | Admitting: Oncology

## 2015-06-13 ENCOUNTER — Inpatient Hospital Stay: Payer: Medicare Other | Admitting: Oncology

## 2015-06-13 ENCOUNTER — Other Ambulatory Visit: Payer: Self-pay | Admitting: *Deleted

## 2015-06-13 VITALS — BP 155/92 | HR 67 | Temp 98.2°F | Resp 18 | Wt 206.4 lb

## 2015-06-13 DIAGNOSIS — E039 Hypothyroidism, unspecified: Secondary | ICD-10-CM | POA: Insufficient documentation

## 2015-06-13 DIAGNOSIS — J45909 Unspecified asthma, uncomplicated: Secondary | ICD-10-CM | POA: Diagnosis not present

## 2015-06-13 DIAGNOSIS — Z85828 Personal history of other malignant neoplasm of skin: Secondary | ICD-10-CM

## 2015-06-13 DIAGNOSIS — I10 Essential (primary) hypertension: Secondary | ICD-10-CM | POA: Diagnosis not present

## 2015-06-13 DIAGNOSIS — Z79810 Long term (current) use of selective estrogen receptor modulators (SERMs): Secondary | ICD-10-CM | POA: Diagnosis not present

## 2015-06-13 DIAGNOSIS — Z803 Family history of malignant neoplasm of breast: Secondary | ICD-10-CM

## 2015-06-13 DIAGNOSIS — E119 Type 2 diabetes mellitus without complications: Secondary | ICD-10-CM | POA: Diagnosis not present

## 2015-06-13 DIAGNOSIS — R739 Hyperglycemia, unspecified: Secondary | ICD-10-CM | POA: Insufficient documentation

## 2015-06-13 DIAGNOSIS — Z79899 Other long term (current) drug therapy: Secondary | ICD-10-CM | POA: Diagnosis not present

## 2015-06-13 DIAGNOSIS — K219 Gastro-esophageal reflux disease without esophagitis: Secondary | ICD-10-CM | POA: Diagnosis not present

## 2015-06-13 DIAGNOSIS — Z7982 Long term (current) use of aspirin: Secondary | ICD-10-CM

## 2015-06-13 DIAGNOSIS — Z170 Estrogen receptor positive status [ER+]: Secondary | ICD-10-CM | POA: Diagnosis not present

## 2015-06-13 DIAGNOSIS — D0512 Intraductal carcinoma in situ of left breast: Secondary | ICD-10-CM | POA: Insufficient documentation

## 2015-06-13 DIAGNOSIS — J309 Allergic rhinitis, unspecified: Secondary | ICD-10-CM | POA: Insufficient documentation

## 2015-06-13 DIAGNOSIS — M549 Dorsalgia, unspecified: Secondary | ICD-10-CM | POA: Diagnosis not present

## 2015-06-13 DIAGNOSIS — C50919 Malignant neoplasm of unspecified site of unspecified female breast: Secondary | ICD-10-CM

## 2015-06-13 DIAGNOSIS — G4730 Sleep apnea, unspecified: Secondary | ICD-10-CM | POA: Diagnosis not present

## 2015-06-13 DIAGNOSIS — E785 Hyperlipidemia, unspecified: Secondary | ICD-10-CM | POA: Diagnosis not present

## 2015-06-13 DIAGNOSIS — G4710 Hypersomnia, unspecified: Secondary | ICD-10-CM | POA: Insufficient documentation

## 2015-06-13 DIAGNOSIS — R600 Localized edema: Secondary | ICD-10-CM | POA: Insufficient documentation

## 2015-06-13 DIAGNOSIS — M129 Arthropathy, unspecified: Secondary | ICD-10-CM | POA: Diagnosis not present

## 2015-06-13 DIAGNOSIS — M1990 Unspecified osteoarthritis, unspecified site: Secondary | ICD-10-CM | POA: Insufficient documentation

## 2015-06-13 DIAGNOSIS — C801 Malignant (primary) neoplasm, unspecified: Secondary | ICD-10-CM | POA: Insufficient documentation

## 2015-06-13 LAB — CBC WITH DIFFERENTIAL/PLATELET
Basophils Absolute: 0.1 10*3/uL (ref 0–0.1)
Basophils Relative: 1 %
Eosinophils Absolute: 0.4 10*3/uL (ref 0–0.7)
Eosinophils Relative: 5 %
HEMATOCRIT: 40.8 % (ref 35.0–47.0)
HEMOGLOBIN: 14.1 g/dL (ref 12.0–16.0)
LYMPHS ABS: 1.7 10*3/uL (ref 1.0–3.6)
LYMPHS PCT: 21 %
MCH: 31 pg (ref 26.0–34.0)
MCHC: 34.6 g/dL (ref 32.0–36.0)
MCV: 89.7 fL (ref 80.0–100.0)
MONOS PCT: 8 %
Monocytes Absolute: 0.7 10*3/uL (ref 0.2–0.9)
NEUTROS PCT: 65 %
Neutro Abs: 5.3 10*3/uL (ref 1.4–6.5)
Platelets: 301 10*3/uL (ref 150–440)
RBC: 4.55 MIL/uL (ref 3.80–5.20)
RDW: 13.8 % (ref 11.5–14.5)
WBC: 8.1 10*3/uL (ref 3.6–11.0)

## 2015-06-13 LAB — COMPREHENSIVE METABOLIC PANEL
ALK PHOS: 98 U/L (ref 38–126)
ALT: 27 U/L (ref 14–54)
ANION GAP: 7 (ref 5–15)
AST: 26 U/L (ref 15–41)
Albumin: 3.8 g/dL (ref 3.5–5.0)
BUN: 15 mg/dL (ref 6–20)
CALCIUM: 8.5 mg/dL — AB (ref 8.9–10.3)
CHLORIDE: 97 mmol/L — AB (ref 101–111)
CO2: 29 mmol/L (ref 22–32)
CREATININE: 0.9 mg/dL (ref 0.44–1.00)
Glucose, Bld: 126 mg/dL — ABNORMAL HIGH (ref 65–99)
Potassium: 3.3 mmol/L — ABNORMAL LOW (ref 3.5–5.1)
Sodium: 133 mmol/L — ABNORMAL LOW (ref 135–145)
Total Bilirubin: 0.7 mg/dL (ref 0.3–1.2)
Total Protein: 7.5 g/dL (ref 6.5–8.1)

## 2015-06-13 MED ORDER — EXEMESTANE 25 MG PO TABS: 25.0000 mg | ORAL_TABLET | Freq: Every day | ORAL | Status: DC

## 2015-06-13 NOTE — Progress Notes (Signed)
[place] @ [facility] [phone]  Fax:([phone] [place]: December 01, 1940  MR#: [id number]  JZP#:[id number]  Patient Care Team: Adin Hector, MD as PCP - General (Internal Medicine)  CHIEF COMPLAINT:  Chief Complaint  Patient presents with  . Breast Cancer     No history exists.   Chief Complaint/Diagnosis:   4. 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 2.  Patient had a joint pains with letrozole and had swelling due to tamoxifen. 3.  Aromasin was tried, November 16, 2012 No flowsheet data found.  INTERVAL HISTORY:75 year old lady with a history of carcinoma of breast stage 0.  Taking Aromasin calcium and vitamin D tolerating treatment very well. Bony pain no bony fracture.  Patient had a mammogram done in December of 2016 reported to be BI-RADS 2 REVIEW OF SYSTEMS:   GENERAL:  Feels good.  Active.  No fevers, sweats or weight loss. PERFORMANCE STATUS (ECOG): 0 HEENT:  No visual changes, runny nose, sore throat, mouth sores or tenderness. Lungs: No shortness of breath or cough.  No hemoptysis. Cardiac:  No chest pain, palpitations, orthopnea, or PND. GI:  No nausea, vomiting, diarrhea, constipation, melena or hematochezia. GU:  No urgency, frequency, dysuria, or hematuria. Musculoskeletal:  No back pain.  No joint pain.  No muscle tenderness. Extremities:  No pain or swelling. Skin:  No rashes or skin changes. Neuro:  No headache, numbness or weakness, balance or coordination issues. Endocrine:  No diabetes, thyroid issues, hot flashes or night sweats. Psych:  No mood changes, depression or anxiety. Pain:  No focal pain. Review of systems:  All other systems reviewed and found to be negative. As per HPI. Otherwise, a complete review of systems is negatve.  PAST MEDICAL HISTORY: Past Medical History  Diagnosis Date  . Breast cancer ([place]) 2013    left  breast ductal and lobular carcinoma  . Skin cancer   . Arthritis   . Asthma   . Back pain   . Hypertension   . GERD (gastroesophageal reflux disease)   . Diabetes mellitus without complication ([place])   . Hyperlipidemia   . Sleep apnea   . Hypothyroidism   . Shingles     PAST SURGICAL HISTORY: Past Surgical History  Procedure Laterality Date  . Breast lumpectomy Left 2013    with rad tx  . Uvulopalatopharyngoplasty      and tongue surgery  . Septoplasty    . Joint replacement    . Knee arthroscopy    . Cataract extraction    . Mastectomy    . Carpal tunnel release    . Colonoscopy with propofol N/A 05/10/2015    Procedure: COLONOSCOPY WITH PROPOFOL;  Surgeon: Hulen Luster, MD;  Location: [facility] ENDOSCOPY;  Service: Gastroenterology;  Laterality: N/A;    FAMILY HISTORY Family History  Problem Relation Age of Onset  . Breast cancer Mother 65    ADVANCED DIRECTIVES:  No flowsheet data found.  HEALTH MAINTENANCE: Social History  Substance Use Topics  . Smoking status: Never Smoker   . Smokeless tobacco: Never Used  . Alcohol Use: No    Vasotec: Unknown  Significant History/PMH:   Left Breast Cancer:    Right Breast Cancer:    Sleep Apnea/CPAP:    Hypertension:    Left Hip Replacement:    Carpal Tunnel Release, Right:    Cataract Surgery  Left Eye:    Excision squamousncell carcinoma X 4:    Cataract surgery right eye:    Left hip replacement: Jul 2008   Right knee arthroscopy: 2005   Surgery throat and tongue: 1999  Preventive Screening:  Has patient had any of the following test? Colonscopy  Mammography (1)   Last Colonoscopy: 2000 elliot(1)   Last Mammography: 03/2014   Smoking History: Smoking History Never Smoked.(1)  PFSH: Comments: Mother had breast cancer.  Brother had colon polyps  Social History: negative alcohol, negative tobacco  Additional Past Medical and Surgical History: As mentioned above     Allergies  Allergen Reactions    . Cardura [Doxazosin]   . Clonidine Derivatives   . Enalapril   . Levaquin [Levofloxacin]   . Mobic [Meloxicam]   . Sulfa Antibiotics   . Vicodin [Hydrocodone-Acetaminophen]     Current Outpatient Prescriptions  Medication Sig Dispense Refill  . amLODipine (NORVASC) 10 MG tablet Take by mouth.    Marland Kitchen aspirin EC 81 MG tablet Take by mouth.    . calcium carbonate (OSCAL) 1500 (600 Ca) MG TABS tablet Take 1,500 mg by mouth 2 (two) times daily with a meal.    . DORZOLAMIDE HCL-TIMOLOL MAL OP Place 1 drop into both eyes 2 (two) times daily.    Marland Kitchen exemestane (AROMASIN) 25 MG tablet Take 25 mg by mouth daily after breakfast.    . furosemide (LASIX) 20 MG tablet Take 20 mg by mouth daily as needed.    . hydrochlorothiazide (HYDRODIURIL) 25 MG tablet Take 25 mg by mouth daily.    Marland Kitchen ibuprofen (ADVIL,MOTRIN) 200 MG tablet Take 200 mg by mouth every 6 (six) hours as needed.    Marland Kitchen levothyroxine (SYNTHROID, LEVOTHROID) 75 MCG tablet Take 75 mcg by mouth daily before breakfast.    . loratadine (CLARITIN) 10 MG tablet Take 10 mg by mouth daily.    Marland Kitchen losartan (COZAAR) 100 MG tablet Take 100 mg by mouth daily.    Marland Kitchen lovastatin (MEVACOR) 40 MG tablet Take 40 mg by mouth at bedtime.    Marland Kitchen LUMIGAN 0.01 % SOLN     . metoprolol succinate (TOPROL-XL) 100 MG 24 hr tablet Take 100 mg by mouth 2 (two) times daily. Take with or immediately following a meal.    . Multiple Vitamin (MULTIVITAMIN) tablet Take 1 tablet by mouth daily.    . pantoprazole (PROTONIX) 40 MG tablet Take 40 mg by mouth daily.    . potassium chloride SA (K-DUR,KLOR-CON) 20 MEQ tablet Take 20 mEq by mouth 2 (two) times daily.     No current facility-administered medications for this visit.    OBJECTIVE:  Filed Vitals:   06/13/15 1522  BP: 155/92  Pulse: 67  Temp: 98.2 F (36.8 C)  Resp: 18     Body mass index is 37.73 kg/(m^2).    ECOG FS:0 - Asymptomatic  PHYSICAL EXAM: Gen. status: Alert oriented not any acute distress Head exam  was generally normal. There was no scleral icterus or corneal arcus. Mucous membranes were moist. Multiple previous ENT surgery Cardiac exam revealed the PMI to be normally situated and sized. The rhythm was regular and no extrasystoles were noted during several minutes of auscultation. The first and second heart sounds were normal and physiologic splitting of the second heart sound was noted. There were no murmurs, rubs, clicks, or gallops. Examination of both breast lab depressed previous history of wide local excision.  Right breast free of masses Lymphatic system: Supraclavicular,  cervical, axillary, inguinal lymph nodes are not palpable Abdominal exam revealed normal bowel sounds. The abdomen was soft, non-tender, and without masses, organomegaly, or appreciable enlargement of the abdominal aorta. Examination of the skin revealed no evidence of significant rashes, suspicious appearing nevi or other concerning lesions.Neurologically, the patient was awake, alert, and oriented to person, place and time. There were no obvious focal neurologic abnormalities.  Muscle skeletal system no bony swelling joint swelling or fracture  LAB RESULTS:  CBC Latest Ref Rng 06/13/2015 12/14/2013  WBC 3.6 - 11.0 K/uL 8.1 8.9  Hemoglobin 12.0 - 16.0 g/dL 14.1 14.5  Hematocrit 35.0 - 47.0 % 40.8 44.4  Platelets 150 - 440 K/uL 301 285    Appointment on 06/13/2015  Component Date Value Ref Range Status  . WBC 06/13/2015 8.1  3.6 - 11.0 K/uL Final  . RBC 06/13/2015 4.55  3.80 - 5.20 MIL/uL Final  . Hemoglobin 06/13/2015 14.1  12.0 - 16.0 g/dL Final  . HCT 06/13/2015 40.8  35.0 - 47.0 % Final  . MCV 06/13/2015 89.7  80.0 - 100.0 fL Final  . MCH 06/13/2015 31.0  26.0 - 34.0 pg Final  . MCHC 06/13/2015 34.6  32.0 - 36.0 g/dL Final  . RDW 06/13/2015 13.8  11.5 - 14.5 % Final  . Platelets 06/13/2015 301  150 - 440 K/uL Final  . Neutrophils Relative % 06/13/2015 65   Final  . Neutro Abs 06/13/2015 5.3  1.4 - 6.5  K/uL Final  . Lymphocytes Relative 06/13/2015 21   Final  . Lymphs Abs 06/13/2015 1.7  1.0 - 3.6 K/uL Final  . Monocytes Relative 06/13/2015 8   Final  . Monocytes Absolute 06/13/2015 0.7  0.2 - 0.9 K/uL Final  . Eosinophils Relative 06/13/2015 5   Final  . Eosinophils Absolute 06/13/2015 0.4  0 - 0.7 K/uL Final  . Basophils Relative 06/13/2015 1   Final  . Basophils Absolute 06/13/2015 0.1  0 - 0.1 K/uL Final  . Sodium 06/13/2015 133* 135 - 145 mmol/L Final  . Potassium 06/13/2015 3.3* 3.5 - 5.1 mmol/L Final  . Chloride 06/13/2015 97* 101 - 111 mmol/L Final  . CO2 06/13/2015 29  22 - 32 mmol/L Final  . Glucose, Bld 06/13/2015 126* 65 - 99 mg/dL Final  . BUN 06/13/2015 15  6 - 20 mg/dL Final  . Creatinine, Ser 06/13/2015 0.90  0.44 - 1.00 mg/dL Final  . Calcium 06/13/2015 8.5* 8.9 - 10.3 mg/dL Final  . Total Protein 06/13/2015 7.5  6.5 - 8.1 g/dL Final  . Albumin 06/13/2015 3.8  3.5 - 5.0 g/dL Final  . AST 06/13/2015 26  15 - 41 U/L Final  . ALT 06/13/2015 27  14 - 54 U/L Final  . Alkaline Phosphatase 06/13/2015 98  38 - 126 U/L Final  . Total Bilirubin 06/13/2015 0.7  0.3 - 1.2 mg/dL Final  . GFR calc non Af Amer 06/13/2015 >60  >60 mL/min Final  . GFR calc Af Amer 06/13/2015 >60  >60 mL/min Final   Comment: (NOTE) The eGFR has been calculated using the CKD EPI equation. This calculation has not been validated in all clinical situations. eGFR's persistently <60 mL/min signify possible Chronic Kidney Disease.   . Anion gap 06/13/2015 7  5 - 15 Final      STUDIES: Mammogram of December of 2016 within normal limit ASSESSMENT: Cancer  of breast: Left breast Tis N0 M0 tumor status post wide local excision On Aromasin Mammogram is been reviewed independently no  evidence of recurrent or progressive disease  Continue Aromasin get a bone density study with the next mammogram  Patient will be evaluated in 12 months with by associate   Patient expressed understanding and was in  agreement with this plan. She also understands that She can call clinic at any time with any questions, concerns, or complaints.    No matching staging information was found for the patient.  Forest Gleason, MD   06/13/2015 3:52 PM

## 2015-06-14 ENCOUNTER — Ambulatory Visit: Payer: Medicare Other | Admitting: Oncology

## 2015-06-14 ENCOUNTER — Other Ambulatory Visit: Payer: Medicare Other

## 2015-06-18 ENCOUNTER — Encounter: Payer: Self-pay | Admitting: Oncology

## 2015-07-04 ENCOUNTER — Ambulatory Visit
Admission: RE | Admit: 2015-07-04 | Discharge: 2015-07-04 | Disposition: A | Discharge status: Home / Self Care | Payer: Medicare Other | Source: Ambulatory Visit | Attending: Radiation Oncology | Admitting: Radiation Oncology

## 2015-07-04 ENCOUNTER — Encounter: Payer: Self-pay | Admitting: Radiation Oncology

## 2015-07-04 VITALS — BP 173/78 | HR 72 | Temp 98.1°F | Resp 18 | Wt 203.6 lb

## 2015-07-04 DIAGNOSIS — C50912 Malignant neoplasm of unspecified site of left female breast: Secondary | ICD-10-CM

## 2015-07-04 NOTE — Progress Notes (Signed)
Radiation Oncology Follow up Note  Name: Alyssa Duncan   Date:   07/04/2015 MRN:  [id number] DOB: 1940/05/13    This 75 y.o. female presents to the clinic today for follow-up for ductal carcinoma in situ ER/PR positive left breast status post whole breast radiation now out 3 years.  REFERRING PROVIDER: No ref. provider found  HPI: Patient is a 75 year old female now out 3 years having completed whole breast radiation to her left breast for stage 0 ductal carcinoma in situ ER/PR positive. She is seen today in routine follow-up and is doing well.. She had a mammogram around the end of the year which was fine. She is currently on Aromasin tolerating that well without side effect. She specifically denies breast tenderness cough or bone pain.  COMPLICATIONS OF TREATMENT: none  FOLLOW UP COMPLIANCE: keeps appointments   PHYSICAL EXAM:  BP 173/78 mmHg  Pulse 72  Temp(Src) 98.1 F (36.7 C)  Resp 18  Wt 203 lb 9.5 oz (92.35 kg) Lungs are clear to A&P cardiac examination essentially unremarkable with regular rate and rhythm. No dominant mass or nodularity is noted in either breast in 2 positions examined. Incision is well-healed. No axillary or supraclavicular adenopathy is appreciated. Cosmetic result is excellent. Patient does have inversion of her right nipple which has always been present. Well-developed well-nourished patient in NAD. HEENT reveals PERLA, EOMI, discs not visualized.  Oral cavity is clear. No oral mucosal lesions are identified. Neck is clear without evidence of cervical or supraclavicular adenopathy. Lungs are clear to A&P. Cardiac examination is essentially unremarkable with regular rate and rhythm without murmur rub or thrill. Abdomen is benign with no organomegaly or masses noted. Motor sensory and DTR levels are equal and symmetric in the upper and lower extremities. Cranial nerves II through XII are grossly intact. Proprioception is intact. No peripheral adenopathy or edema  is identified. No motor or sensory levels are noted. Crude visual fields are within normal range.  RADIOLOGY RESULTS: Mammograms are reviewed  PLAN: Present time patient continues to do well with no evidence of disease. I have asked to see her back in 1 year for follow-up. She continues on Aromasin without side effect. Patient knows to call with any concerns.  I would like to take this opportunity for allowing me to participate in the care of your patient.Armstead Peaks., MD

## 2015-08-22 ENCOUNTER — Ambulatory Visit
Admission: RE | Admit: 2015-08-22 | Discharge: 2015-08-22 | Disposition: A | Discharge status: Home / Self Care | Payer: Medicare Other | Source: Ambulatory Visit | Attending: Oncology | Admitting: Oncology

## 2015-08-22 DIAGNOSIS — Z923 Personal history of irradiation: Secondary | ICD-10-CM | POA: Diagnosis not present

## 2015-08-22 DIAGNOSIS — C50919 Malignant neoplasm of unspecified site of unspecified female breast: Secondary | ICD-10-CM

## 2015-08-22 DIAGNOSIS — Z79899 Other long term (current) drug therapy: Secondary | ICD-10-CM | POA: Diagnosis present

## 2015-08-22 DIAGNOSIS — Z853 Personal history of malignant neoplasm of breast: Secondary | ICD-10-CM | POA: Insufficient documentation

## 2015-12-03 ENCOUNTER — Encounter: Payer: Self-pay | Admitting: Anesthesiology

## 2015-12-03 ENCOUNTER — Inpatient Hospital Stay
Admission: AD | Admit: 2015-12-03 | Discharge: 2015-12-09 | DRG: 871 | Disposition: A | Discharge status: Home / Self Care | Payer: Medicare Other | Source: Ambulatory Visit | Attending: Specialist | Admitting: Specialist

## 2015-12-03 ENCOUNTER — Emergency Department
Admission: EM | Admit: 2015-12-03 | Discharge: 2015-12-03 | Disposition: A | Discharge status: Home / Self Care | Payer: Medicare Other | Source: Home / Self Care | Attending: Emergency Medicine | Admitting: Emergency Medicine

## 2015-12-03 ENCOUNTER — Emergency Department: Payer: Medicare Other

## 2015-12-03 ENCOUNTER — Encounter: Payer: Self-pay | Admitting: Emergency Medicine

## 2015-12-03 ENCOUNTER — Encounter
Admission: AD | Disposition: A | Discharge status: Home / Self Care | Payer: Self-pay | Source: Ambulatory Visit | Attending: Specialist

## 2015-12-03 DIAGNOSIS — E1165 Type 2 diabetes mellitus with hyperglycemia: Secondary | ICD-10-CM | POA: Diagnosis present

## 2015-12-03 DIAGNOSIS — F05 Delirium due to known physiological condition: Secondary | ICD-10-CM | POA: Diagnosis not present

## 2015-12-03 DIAGNOSIS — N23 Unspecified renal colic: Secondary | ICD-10-CM | POA: Insufficient documentation

## 2015-12-03 DIAGNOSIS — I4891 Unspecified atrial fibrillation: Secondary | ICD-10-CM | POA: Diagnosis present

## 2015-12-03 DIAGNOSIS — I10 Essential (primary) hypertension: Secondary | ICD-10-CM | POA: Diagnosis present

## 2015-12-03 DIAGNOSIS — K8020 Calculus of gallbladder without cholecystitis without obstruction: Secondary | ICD-10-CM | POA: Insufficient documentation

## 2015-12-03 DIAGNOSIS — Z853 Personal history of malignant neoplasm of breast: Secondary | ICD-10-CM

## 2015-12-03 DIAGNOSIS — R0603 Acute respiratory distress: Secondary | ICD-10-CM

## 2015-12-03 DIAGNOSIS — J9601 Acute respiratory failure with hypoxia: Secondary | ICD-10-CM | POA: Diagnosis not present

## 2015-12-03 DIAGNOSIS — A4151 Sepsis due to Escherichia coli [E. coli]: Secondary | ICD-10-CM | POA: Diagnosis present

## 2015-12-03 DIAGNOSIS — R0600 Dyspnea, unspecified: Secondary | ICD-10-CM

## 2015-12-03 DIAGNOSIS — Z85828 Personal history of other malignant neoplasm of skin: Secondary | ICD-10-CM | POA: Insufficient documentation

## 2015-12-03 DIAGNOSIS — Z7982 Long term (current) use of aspirin: Secondary | ICD-10-CM | POA: Insufficient documentation

## 2015-12-03 DIAGNOSIS — G4733 Obstructive sleep apnea (adult) (pediatric): Secondary | ICD-10-CM | POA: Diagnosis not present

## 2015-12-03 DIAGNOSIS — N179 Acute kidney failure, unspecified: Secondary | ICD-10-CM | POA: Diagnosis present

## 2015-12-03 DIAGNOSIS — J9621 Acute and chronic respiratory failure with hypoxia: Secondary | ICD-10-CM | POA: Diagnosis not present

## 2015-12-03 DIAGNOSIS — I959 Hypotension, unspecified: Secondary | ICD-10-CM | POA: Diagnosis present

## 2015-12-03 DIAGNOSIS — N390 Urinary tract infection, site not specified: Secondary | ICD-10-CM | POA: Diagnosis present

## 2015-12-03 DIAGNOSIS — Z888 Allergy status to other drugs, medicaments and biological substances status: Secondary | ICD-10-CM

## 2015-12-03 DIAGNOSIS — J45909 Unspecified asthma, uncomplicated: Secondary | ICD-10-CM | POA: Diagnosis present

## 2015-12-03 DIAGNOSIS — J810 Acute pulmonary edema: Secondary | ICD-10-CM | POA: Diagnosis not present

## 2015-12-03 DIAGNOSIS — N132 Hydronephrosis with renal and ureteral calculous obstruction: Secondary | ICD-10-CM | POA: Diagnosis present

## 2015-12-03 DIAGNOSIS — R410 Disorientation, unspecified: Secondary | ICD-10-CM

## 2015-12-03 DIAGNOSIS — Z881 Allergy status to other antibiotic agents status: Secondary | ICD-10-CM

## 2015-12-03 DIAGNOSIS — Z96642 Presence of left artificial hip joint: Secondary | ICD-10-CM | POA: Insufficient documentation

## 2015-12-03 DIAGNOSIS — H409 Unspecified glaucoma: Secondary | ICD-10-CM | POA: Diagnosis present

## 2015-12-03 DIAGNOSIS — M1990 Unspecified osteoarthritis, unspecified site: Secondary | ICD-10-CM | POA: Diagnosis present

## 2015-12-03 DIAGNOSIS — E119 Type 2 diabetes mellitus without complications: Secondary | ICD-10-CM

## 2015-12-03 DIAGNOSIS — N201 Calculus of ureter: Secondary | ICD-10-CM

## 2015-12-03 DIAGNOSIS — Z79811 Long term (current) use of aromatase inhibitors: Secondary | ICD-10-CM

## 2015-12-03 DIAGNOSIS — K219 Gastro-esophageal reflux disease without esophagitis: Secondary | ICD-10-CM | POA: Diagnosis present

## 2015-12-03 DIAGNOSIS — Z803 Family history of malignant neoplasm of breast: Secondary | ICD-10-CM

## 2015-12-03 DIAGNOSIS — Z79899 Other long term (current) drug therapy: Secondary | ICD-10-CM

## 2015-12-03 DIAGNOSIS — R7881 Bacteremia: Secondary | ICD-10-CM

## 2015-12-03 DIAGNOSIS — A419 Sepsis, unspecified organism: Secondary | ICD-10-CM | POA: Diagnosis not present

## 2015-12-03 DIAGNOSIS — D696 Thrombocytopenia, unspecified: Secondary | ICD-10-CM | POA: Diagnosis present

## 2015-12-03 DIAGNOSIS — Z882 Allergy status to sulfonamides status: Secondary | ICD-10-CM

## 2015-12-03 DIAGNOSIS — Z886 Allergy status to analgesic agent status: Secondary | ICD-10-CM

## 2015-12-03 DIAGNOSIS — R1031 Right lower quadrant pain: Secondary | ICD-10-CM | POA: Diagnosis not present

## 2015-12-03 DIAGNOSIS — I248 Other forms of acute ischemic heart disease: Secondary | ICD-10-CM | POA: Diagnosis present

## 2015-12-03 DIAGNOSIS — D494 Neoplasm of unspecified behavior of bladder: Secondary | ICD-10-CM | POA: Diagnosis present

## 2015-12-03 DIAGNOSIS — R6520 Severe sepsis without septic shock: Secondary | ICD-10-CM | POA: Diagnosis present

## 2015-12-03 DIAGNOSIS — E039 Hypothyroidism, unspecified: Secondary | ICD-10-CM

## 2015-12-03 DIAGNOSIS — G4730 Sleep apnea, unspecified: Secondary | ICD-10-CM | POA: Diagnosis present

## 2015-12-03 DIAGNOSIS — E876 Hypokalemia: Secondary | ICD-10-CM

## 2015-12-03 DIAGNOSIS — E785 Hyperlipidemia, unspecified: Secondary | ICD-10-CM | POA: Diagnosis present

## 2015-12-03 DIAGNOSIS — R112 Nausea with vomiting, unspecified: Secondary | ICD-10-CM | POA: Diagnosis not present

## 2015-12-03 DIAGNOSIS — N200 Calculus of kidney: Secondary | ICD-10-CM | POA: Insufficient documentation

## 2015-12-03 LAB — COMPREHENSIVE METABOLIC PANEL
ALBUMIN: 4.3 g/dL (ref 3.5–5.0)
ALBUMIN: 3.1 g/dL — AB (ref 3.5–5.0)
ALT: 36 U/L (ref 14–54)
ALT: 31 U/L (ref 14–54)
ANION GAP: 10 (ref 5–15)
ANION GAP: 14 (ref 5–15)
AST: 36 U/L (ref 15–41)
AST: 44 U/L — AB (ref 15–41)
Alkaline Phosphatase: 110 U/L (ref 38–126)
Alkaline Phosphatase: 102 U/L (ref 38–126)
BUN: 28 mg/dL — ABNORMAL HIGH (ref 6–20)
BUN: 33 mg/dL — ABNORMAL HIGH (ref 6–20)
CALCIUM: 8.2 mg/dL — AB (ref 8.9–10.3)
CO2: 30 mmol/L (ref 22–32)
CO2: 22 mmol/L (ref 22–32)
Calcium: 9.5 mg/dL (ref 8.9–10.3)
Chloride: 101 mmol/L (ref 101–111)
Chloride: 102 mmol/L (ref 101–111)
Creatinine, Ser: 1.49 mg/dL — ABNORMAL HIGH (ref 0.44–1.00)
Creatinine, Ser: 2.39 mg/dL — ABNORMAL HIGH (ref 0.44–1.00)
GFR calc Af Amer: 38 mL/min — ABNORMAL LOW (ref >60)
GFR calc non Af Amer: 33 mL/min — ABNORMAL LOW (ref >60)
GFR calc non Af Amer: 18 mL/min — ABNORMAL LOW (ref >60)
GFR, EST AFRICAN AMERICAN: 21 mL/min — AB (ref >60)
GLUCOSE: 166 mg/dL — AB (ref 65–99)
GLUCOSE: 128 mg/dL — AB (ref 65–99)
POTASSIUM: 3.6 mmol/L (ref 3.5–5.1)
POTASSIUM: 3.7 mmol/L (ref 3.5–5.1)
SODIUM: 141 mmol/L (ref 135–145)
SODIUM: 138 mmol/L (ref 135–145)
Total Bilirubin: 0.7 mg/dL (ref 0.3–1.2)
Total Bilirubin: 0.7 mg/dL (ref 0.3–1.2)
Total Protein: 7.9 g/dL (ref 6.5–8.1)
Total Protein: 6.4 g/dL — ABNORMAL LOW (ref 6.5–8.1)

## 2015-12-03 LAB — CBC
HCT: 39.4 % (ref 35.0–47.0)
HEMATOCRIT: 45.1 % (ref 35.0–47.0)
HEMOGLOBIN: 15.5 g/dL (ref 12.0–16.0)
Hemoglobin: 13.4 g/dL (ref 12.0–16.0)
MCH: 30.9 pg (ref 26.0–34.0)
MCH: 31.2 pg (ref 26.0–34.0)
MCHC: 34.4 g/dL (ref 32.0–36.0)
MCHC: 34 g/dL (ref 32.0–36.0)
MCV: 89.8 fL (ref 80.0–100.0)
MCV: 91.8 fL (ref 80.0–100.0)
PLATELETS: 191 10*3/uL (ref 150–440)
Platelets: 285 10*3/uL (ref 150–440)
RBC: 5.02 MIL/uL (ref 3.80–5.20)
RBC: 4.29 MIL/uL (ref 3.80–5.20)
RDW: 14 % (ref 11.5–14.5)
RDW: 14.4 % (ref 11.5–14.5)
WBC: 13.3 10*3/uL — ABNORMAL HIGH (ref 3.6–11.0)
WBC: 11 10*3/uL (ref 3.6–11.0)

## 2015-12-03 LAB — PROTIME-INR
INR: 1.14
Prothrombin Time: 14.7 seconds (ref 11.4–15.2)

## 2015-12-03 LAB — URINALYSIS COMPLETE WITH MICROSCOPIC (ARMC ONLY)
Bilirubin Urine: NEGATIVE
GLUCOSE, UA: NEGATIVE mg/dL
Ketones, ur: NEGATIVE mg/dL
NITRITE: NEGATIVE
PROTEIN: NEGATIVE mg/dL
Specific Gravity, Urine: 1.032 — ABNORMAL HIGH (ref 1.005–1.030)
pH: 8 (ref 5.0–8.0)

## 2015-12-03 SURGERY — CYSTOSCOPY, WITH STENT INSERTION
Anesthesia: Choice

## 2015-12-03 MED ORDER — KETOROLAC TROMETHAMINE 30 MG/ML IJ SOLN
15.0000 mg | INTRAMUSCULAR | Status: AC
  Administered 2015-12-03: 15 mg via INTRAVENOUS

## 2015-12-03 MED ORDER — SODIUM CHLORIDE 0.9 % IV SOLN
INTRAVENOUS | Status: AC
  Administered 2015-12-04 (×2): via INTRAVENOUS

## 2015-12-03 MED ORDER — SODIUM CHLORIDE 0.9 % IV BOLUS (SEPSIS)
1000.0000 mL | Freq: Once | INTRAVENOUS | Status: AC
Start: 2015-12-03 — End: 2015-12-03
  Administered 2015-12-03: 1000 mL via INTRAVENOUS

## 2015-12-03 MED ORDER — MORPHINE SULFATE (PF) 2 MG/ML IV SOLN
2.0000 mg | INTRAVENOUS | Status: DC | PRN
  Administered 2015-12-03 – 2015-12-06 (×5): 2 mg via INTRAVENOUS
  Filled 2015-12-03 (×6): qty 1

## 2015-12-03 MED ORDER — ACETAMINOPHEN 325 MG PO TABS: 650.0000 mg | ORAL_TABLET | Freq: Four times a day (QID) | ORAL | Status: DC | PRN

## 2015-12-03 MED ORDER — LEVOTHYROXINE SODIUM 75 MCG PO TABS
75.0000 ug | ORAL_TABLET | Freq: Every day | ORAL | Status: DC
  Administered 2015-12-04 – 2015-12-09 (×4): 75 ug via ORAL
  Filled 2015-12-03 (×4): qty 1

## 2015-12-03 MED ORDER — PANTOPRAZOLE SODIUM 40 MG PO TBEC
40.0000 mg | DELAYED_RELEASE_TABLET | Freq: Every day | ORAL | Status: DC
  Administered 2015-12-03 – 2015-12-09 (×6): 40 mg via ORAL
  Filled 2015-12-03 (×6): qty 1

## 2015-12-03 MED ORDER — ADULT MULTIVITAMIN W/MINERALS CH
1.0000 | ORAL_TABLET | Freq: Every day | ORAL | Status: DC
  Administered 2015-12-05 – 2015-12-09 (×4): 1 via ORAL
  Filled 2015-12-03 (×4): qty 1

## 2015-12-03 MED ORDER — DORZOLAMIDE HCL-TIMOLOL MAL 2-0.5 % OP SOLN
1.0000 [drp] | Freq: Two times a day (BID) | OPHTHALMIC | Status: DC
  Administered 2015-12-03 – 2015-12-06 (×5): 1 [drp] via OPHTHALMIC
  Filled 2015-12-03 (×2): qty 10

## 2015-12-03 MED ORDER — LORATADINE 10 MG PO TABS
10.0000 mg | ORAL_TABLET | Freq: Every day | ORAL | Status: DC
  Administered 2015-12-03 – 2015-12-05 (×2): 10 mg via ORAL
  Filled 2015-12-03 (×2): qty 1

## 2015-12-03 MED ORDER — ONDANSETRON HCL 4 MG/2ML IJ SOLN
4.0000 mg | Freq: Once | INTRAMUSCULAR | Status: AC
  Administered 2015-12-03: 4 mg via INTRAVENOUS
  Filled 2015-12-03: qty 2

## 2015-12-03 MED ORDER — ONDANSETRON HCL 4 MG/2ML IJ SOLN
INTRAMUSCULAR | Status: AC
  Filled 2015-12-03: qty 2

## 2015-12-03 MED ORDER — MORPHINE SULFATE (PF) 4 MG/ML IV SOLN
4.0000 mg | Freq: Once | INTRAVENOUS | Status: AC
  Administered 2015-12-03: 4 mg via INTRAVENOUS
  Filled 2015-12-03: qty 1

## 2015-12-03 MED ORDER — METOCLOPRAMIDE HCL 10 MG PO TABS: 10.0000 mg | ORAL_TABLET | Freq: Four times a day (QID) | ORAL | 0 refills | Status: DC | PRN

## 2015-12-03 MED ORDER — ONDANSETRON 4 MG PO TBDP
4.0000 mg | ORAL_TABLET | Freq: Once | ORAL | Status: AC | PRN
  Administered 2015-12-03: 4 mg via ORAL
  Filled 2015-12-03: qty 1

## 2015-12-03 MED ORDER — ONDANSETRON 4 MG PO TBDP: 4.0000 mg | ORAL_TABLET | Freq: Three times a day (TID) | ORAL | 0 refills | Status: DC | PRN

## 2015-12-03 MED ORDER — PRAVASTATIN SODIUM 40 MG PO TABS
40.0000 mg | ORAL_TABLET | Freq: Every day | ORAL | Status: DC
  Administered 2015-12-04 – 2015-12-08 (×5): 40 mg via ORAL
  Filled 2015-12-03: qty 1
  Filled 2015-12-03: qty 2
  Filled 2015-12-03 (×3): qty 1

## 2015-12-03 MED ORDER — SODIUM CHLORIDE 0.9 % IV BOLUS (SEPSIS)
1000.0000 mL | Freq: Once | INTRAVENOUS | Status: AC
  Administered 2015-12-03: 1000 mL via INTRAVENOUS

## 2015-12-03 MED ORDER — ONDANSETRON HCL 4 MG/2ML IJ SOLN
4.0000 mg | Freq: Four times a day (QID) | INTRAMUSCULAR | Status: DC | PRN
  Administered 2015-12-04 (×2): 4 mg via INTRAVENOUS
  Filled 2015-12-03: qty 2

## 2015-12-03 MED ORDER — TAMSULOSIN HCL 0.4 MG PO CAPS
0.4000 mg | ORAL_CAPSULE | Freq: Every day | ORAL | Status: DC
  Administered 2015-12-03 – 2015-12-05 (×2): 0.4 mg via ORAL
  Filled 2015-12-03 (×2): qty 1

## 2015-12-03 MED ORDER — ONDANSETRON HCL 4 MG/2ML IJ SOLN
4.0000 mg | Freq: Once | INTRAMUSCULAR | Status: AC
  Administered 2015-12-03: 4 mg via INTRAVENOUS

## 2015-12-03 MED ORDER — PIPERACILLIN-TAZOBACTAM 3.375 G IVPB
3.3750 g | Freq: Three times a day (TID) | INTRAVENOUS | Status: DC
  Administered 2015-12-03 – 2015-12-04 (×2): 3.375 g via INTRAVENOUS
  Filled 2015-12-03 (×3): qty 50

## 2015-12-03 MED ORDER — HEPARIN SODIUM (PORCINE) 5000 UNIT/ML IJ SOLN
5000.0000 [IU] | Freq: Three times a day (TID) | INTRAMUSCULAR | Status: DC
  Administered 2015-12-03 – 2015-12-05 (×7): 5000 [IU] via SUBCUTANEOUS
  Filled 2015-12-03 (×7): qty 1

## 2015-12-03 MED ORDER — KETOROLAC TROMETHAMINE 30 MG/ML IJ SOLN
INTRAMUSCULAR | Status: AC
  Filled 2015-12-03: qty 1

## 2015-12-03 MED ORDER — IOPAMIDOL (ISOVUE-300) INJECTION 61%
75.0000 mL | Freq: Once | INTRAVENOUS | Status: AC | PRN
  Administered 2015-12-03: 75 mL via INTRAVENOUS

## 2015-12-03 MED ORDER — ONDANSETRON HCL 4 MG/2ML IJ SOLN
INTRAMUSCULAR | Status: AC
  Administered 2015-12-03: 4 mg via INTRAVENOUS
  Filled 2015-12-03: qty 2

## 2015-12-03 MED ORDER — OXYCODONE-ACETAMINOPHEN 5-325 MG PO TABS: 1.0000 | ORAL_TABLET | Freq: Four times a day (QID) | ORAL | 0 refills | Status: DC | PRN

## 2015-12-03 NOTE — ED Notes (Signed)
Pt vomited immediately after morphine administration. MD [facility] notified. Will continue to monitor.

## 2015-12-03 NOTE — ED Triage Notes (Signed)
Pt states right lower quadrant pain since yesterday am with associated vomiting x3. Pt denies known fever, diarrhea. Pt appears in no acute distress in triage, laughing and smiling, ambulatory without difficulty. Skin normal color warm and dry. Pt denies dizziness.

## 2015-12-03 NOTE — ED Notes (Signed)
Report to jenna, rn.  

## 2015-12-03 NOTE — ED Notes (Signed)
Pt still reporting nausea, however severity has decreased. RN and pt discussed pt staying for an additional 10-20 minutes to make sure nausea medication effective before completely discharging pt from hospital. MD Joni Fears notified

## 2015-12-03 NOTE — ED Notes (Signed)
Pt presents with c/o abd pain and vomiting; concerned she has a small bowel obstruction

## 2015-12-03 NOTE — ED Notes (Signed)
  Reviewed d/c instructions, follow-up care, and prescriptions with pt. Pt verbalized understanding 

## 2015-12-03 NOTE — ED Notes (Signed)
Pt called out to report emesis. MD [facility] notified. MD Joni Fears ordered 4 mg Zofran. MD Joni Fears approved the frequency/timing of dose

## 2015-12-03 NOTE — ED Notes (Signed)
Pt states pain is coming back and nausea is returning. Pt to go to room5.

## 2015-12-03 NOTE — ED Provider Notes (Signed)
[facility] Emergency Department Provider Note  ____________________________________________  Time seen: Approximately 4:56 AM  I have reviewed the triage vital signs and the nursing notes.   HISTORY  Chief Complaint Emesis and Abdominal Pain    HPI Alyssa Duncan is a 75 y.o. female who complains of right flank and right lower quadrant abdominal pain for the past 24 hours. Migrating more from the flank to the right lower quadrant. No dysuria frequency urgency or hematuria. No fevers or chills but is having some vomiting. Pain is constant but waxing and waning. No aggravating or alleviating factors. Severe in intensity.     Past Medical History:  Diagnosis Date  . Arthritis   . Asthma   . Back pain   . Breast cancer ([place]) 2013   left breast ductal and lobular carcinoma  . Diabetes mellitus without complication ([place])   . GERD (gastroesophageal reflux disease)   . Hyperlipidemia   . Hypertension   . Hypothyroidism   . Shingles   . Skin cancer   . Sleep apnea      Patient Active Problem List   Diagnosis Date Noted  . Allergic rhinitis 06/13/2015  . Arthritis 06/13/2015  . Asthma without status asthmaticus 06/13/2015  . Back ache 06/13/2015  . Malignant neoplastic disease ([place]) 06/13/2015  . Edema leg 06/13/2015  . Benign essential HTN 06/13/2015  . Acid reflux 06/13/2015  . Blood glucose elevated 06/13/2015  . HLD (hyperlipidemia) 06/13/2015  . Hypersomnia with sleep apnea 06/13/2015  . Adult hypothyroidism 06/13/2015  . Encounter for surgical follow-up care 05/17/2015  . History of repair of hip joint 05/17/2015     Past Surgical History:  Procedure Laterality Date  . BREAST LUMPECTOMY Left 2013   with rad tx  . CARPAL TUNNEL RELEASE    . CATARACT EXTRACTION    . COLONOSCOPY WITH PROPOFOL N/A 05/10/2015   Procedure: COLONOSCOPY WITH PROPOFOL;  Surgeon: Hulen Luster, MD;  Location: [facility] ENDOSCOPY;  Service: Gastroenterology;   Laterality: N/A;  . JOINT REPLACEMENT    . KNEE ARTHROSCOPY    . MASTECTOMY    . SEPTOPLASTY    . UVULOPALATOPHARYNGOPLASTY     and tongue surgery     Prior to Admission medications   Medication Sig Start Date End Date Taking? Authorizing Provider  amLODipine (NORVASC) 10 MG tablet Take by mouth. 02/12/15   Historical Provider, MD  aspirin EC 81 MG tablet Take by mouth.    Historical Provider, MD  calcium carbonate (OSCAL) 1500 (600 Ca) MG TABS tablet Take 1,500 mg by mouth 2 (two) times daily with a meal.    Historical Provider, MD  DORZOLAMIDE HCL-TIMOLOL MAL OP Place 1 drop into both eyes 2 (two) times daily.    Historical Provider, MD  exemestane (AROMASIN) 25 MG tablet Take 1 tablet (25 mg total) by mouth daily after breakfast. 06/13/15   Forest Gleason, MD  furosemide (LASIX) 20 MG tablet Take 20 mg by mouth daily as needed.    Historical Provider, MD  hydrochlorothiazide (HYDRODIURIL) 25 MG tablet Take 25 mg by mouth daily.    Historical Provider, MD  ibuprofen (ADVIL,MOTRIN) 200 MG tablet Take 200 mg by mouth every 6 (six) hours as needed.    Historical Provider, MD  levothyroxine (SYNTHROID, LEVOTHROID) 75 MCG tablet Take 75 mcg by mouth daily before breakfast.    Historical Provider, MD  loratadine (CLARITIN) 10 MG tablet Take 10 mg by mouth daily.    Historical Provider, MD  losartan (COZAAR) 100 MG tablet Take 100 mg by mouth daily.    Historical Provider, MD  lovastatin (MEVACOR) 40 MG tablet Take 40 mg by mouth at bedtime.    Historical Provider, MD  LUMIGAN 0.01 % SOLN  06/06/15   Historical Provider, MD  metoprolol succinate (TOPROL-XL) 100 MG 24 hr tablet Take 100 mg by mouth 2 (two) times daily. Take with or immediately following a meal.    Historical Provider, MD  Multiple Vitamin (MULTIVITAMIN) tablet Take 1 tablet by mouth daily.    Historical Provider, MD  ondansetron (ZOFRAN ODT) 4 MG disintegrating tablet Take 1 tablet (4 mg total) by mouth every 8 (eight) hours as needed  for nausea or vomiting. 12/03/15   Carrie Mew, MD  oxyCODONE-acetaminophen (ROXICET) 5-325 MG tablet Take 1 tablet by mouth every 6 (six) hours as needed for severe pain. 12/03/15   Carrie Mew, MD  pantoprazole (PROTONIX) 40 MG tablet Take 40 mg by mouth daily.    Historical Provider, MD  potassium chloride SA (K-DUR,KLOR-CON) 20 MEQ tablet Take 20 mEq by mouth 2 (two) times daily.    Historical Provider, MD     Allergies Cardura [doxazosin]; Clonidine derivatives; Enalapril; Levaquin [levofloxacin]; Mobic [meloxicam]; Sulfa antibiotics; and Vicodin [hydrocodone-acetaminophen]   Family History  Problem Relation Age of Onset  . Breast cancer Mother 8    Social History Social History  Substance Use Topics  . Smoking status: Never Smoker  . Smokeless tobacco: Never Used  . Alcohol use No    Review of Systems  Constitutional:   No fever or chills.   Cardiovascular:   No chest pain. Respiratory:   No dyspnea or cough. Gastrointestinal:   Right lower quadrant pain with vomiting. No constipation.  Genitourinary:   Negative for dysuria or difficulty urinating. Musculoskeletal:   Negative for focal pain or swelling  10-point ROS otherwise negative.  ____________________________________________   PHYSICAL EXAM:  VITAL SIGNS: ED Triage Vitals  Enc Vitals Group     BP 12/03/15 0148 (!) 146/61     Pulse Rate 12/03/15 0148 84     Resp 12/03/15 0148 14     Temp 12/03/15 0148 98.2 F (36.8 C)     Temp Source 12/03/15 0148 Oral     SpO2 12/03/15 0148 96 %     Weight 12/03/15 0148 205 lb (93 kg)     Height 12/03/15 0148 5\' 2"  (1.575 m)     Head Circumference --      Peak Flow --      Pain Score 12/03/15 0149 8     Pain Loc --      Pain Edu? --      Excl. in [place]? --     Vital signs reviewed, nursing assessments reviewed.   Constitutional:   Alert and oriented. Well appearing and in no distress. Eyes:   No scleral icterus. No conjunctival pallor. ENT    Head:   Normocephalic and atraumatic.   Nose:   No congestion/rhinnorhea. No septal hematoma   Mouth/Throat:   Dry mucous membranes, no pharyngeal erythema. No peritonsillar mass.    Neck:   No stridor. No SubQ emphysema. No meningismus. Hematological/Lymphatic/Immunilogical:   No cervical lymphadenopathy. Cardiovascular:   RRR. Symmetric bilateral radial and DP pulses.  No murmurs.  Respiratory:   Normal respiratory effort without tachypnea nor retractions. Breath sounds are clear and equal bilaterally. No wheezes/rales/rhonchi. Gastrointestinal:   Soft and nontender. Non distended. There is no CVA tenderness.  No rebound,  rigidity, or guarding. Genitourinary:   deferred Musculoskeletal:   Nontender with normal range of motion in all extremities. No joint effusions.  No lower extremity tenderness.  No edema. Neurologic:   Normal speech and language.  CN 2-10 normal. Motor grossly intact. No gross focal neurologic deficits are appreciated.  Skin:    Skin is warm, dry and intact. No rash noted.  No petechiae, purpura, or bullae.  ____________________________________________    LABS (pertinent positives/negatives) (all labs ordered are listed, but only abnormal results are displayed) Labs Reviewed  COMPREHENSIVE METABOLIC PANEL - Abnormal; Notable for the following:       Result Value   Glucose, Bld 166 (*)    BUN 28 (*)    Creatinine, Ser 1.49 (*)    GFR calc non Af Amer 33 (*)    GFR calc Af Amer 38 (*)    All other components within normal limits  CBC - Abnormal; Notable for the following:    WBC 13.3 (*)    All other components within normal limits  URINALYSIS COMPLETEWITH MICROSCOPIC ([facility] ONLY) - Abnormal; Notable for the following:    Color, Urine YELLOW (*)    APPearance CLEAR (*)    Specific Gravity, Urine 1.032 (*)    Hgb urine dipstick 1+ (*)    Leukocytes, UA TRACE (*)    Bacteria, UA RARE (*)    Squamous Epithelial / LPF 0-5 (*)    All other components  within normal limits   ____________________________________________   EKG  Interpreted by me Normal sinus rhythm rate 83, normal axis intervals. Poor R-wave progression in anterior precordial leads. Normal ST segments and T waves.  ____________________________________________    RADIOLOGY  CT abdomen and pelvis reveals 5 mm stone in the distal right ureter with moderate obstruction  ____________________________________________   PROCEDURES Procedures  ____________________________________________   INITIAL IMPRESSION / ASSESSMENT AND PLAN / ED COURSE  Pertinent labs & imaging results that were available during my care of the patient were reviewed by me and considered in my medical decision making (see chart for details).  Patient well appearing no acute distress. Presents with right flank pain found to have ureterolithiasis. Symptoms are controlled. Vital signs unremarkable. Creatinine is slightly elevated to 1.5 from a baseline of 1. No evidence of urinary tract infection. Discussed with urology Dr. Roni Bread who advises the patient should be suitable for outpatient follow-up if pain is controlled. We'll have the patient follow-up with urology clinic today for continued monitoring of her symptoms.  Considering the patient's symptoms, medical history, and physical examination today, I have low suspicion for cholecystitis or biliary pathology, pancreatitis, perforation or bowel obstruction, hernia, intra-abdominal abscess, AAA or dissection, volvulus or intussusception, mesenteric ischemia, or appendicitis.     Clinical Course   ____________________________________________   FINAL CLINICAL IMPRESSION(S) / ED DIAGNOSES  Final diagnoses:  Ureterolithiasis  Renal colic on right side       Portions of this note were generated with dragon dictation software. Dictation errors may occur despite best attempts at proofreading.    Carrie Mew, MD 12/03/15 [phone]

## 2015-12-03 NOTE — H&P (Signed)
[facility] NAME: Alyssa Duncan    MR#:  [id number]  DATE OF BIRTH:  Dec 15, 1940  DATE OF ADMISSION:  12/03/2015  PRIMARY CARE PHYSICIAN: Tama High III, MD   REQUESTING/REFERRING PHYSICIAN: Dr. Lequita Halt  CHIEF COMPLAINT:  No chief complaint on file.   HISTORY OF PRESENT ILLNESS: Alyssa Duncan  is a 75 y.o. female with a known history of Hyperlipidemia, Hypothyroidism, asthma, sleep apnea- was in ER last night with nausea, vomiting, abdominal pain - found to have 5 mm stone in ureter - sent home- seen by PMD today with low grade fever, slight low BP and continue to get worse on pain and nausea. Sent for direct admission.  PAST MEDICAL HISTORY:   Past Medical History:  Diagnosis Date  . Arthritis   . Asthma   . Back pain   . Breast cancer ([place]) 2013   left breast ductal and lobular carcinoma  . Diabetes mellitus without complication ([place])   . GERD (gastroesophageal reflux disease)   . Hyperlipidemia   . Hypertension   . Hypothyroidism   . Shingles   . Skin cancer   . Sleep apnea     PAST SURGICAL HISTORY: Past Surgical History:  Procedure Laterality Date  . BREAST LUMPECTOMY Left 2013   with rad tx  . CARPAL TUNNEL RELEASE    . CATARACT EXTRACTION    . COLONOSCOPY WITH PROPOFOL N/A 05/10/2015   Procedure: COLONOSCOPY WITH PROPOFOL;  Surgeon: Hulen Luster, MD;  Location: [facility] ENDOSCOPY;  Service: Gastroenterology;  Laterality: N/A;  . JOINT REPLACEMENT    . KNEE ARTHROSCOPY    . MASTECTOMY    . SEPTOPLASTY    . UVULOPALATOPHARYNGOPLASTY     and tongue surgery    SOCIAL HISTORY:  Social History  Substance Use Topics  . Smoking status: Never Smoker  . Smokeless tobacco: Never Used  . Alcohol use No    FAMILY HISTORY:  Family History  Problem Relation Age of Onset  . Breast cancer Mother 40    DRUG ALLERGIES:  Allergies  Allergen Reactions  . Cardura [Doxazosin]   . Clonidine Derivatives   . Enalapril   .  Levaquin [Levofloxacin]   . Mobic [Meloxicam]   . Sulfa Antibiotics   . Vicodin [Hydrocodone-Acetaminophen]     REVIEW OF SYSTEMS:   CONSTITUTIONAL: No fever, fatigue or weakness.  EYES: No blurred or double vision.  EARS, NOSE, AND THROAT: No tinnitus or ear pain.  RESPIRATORY: No cough, shortness of breath, wheezing or hemoptysis.  CARDIOVASCULAR: No chest pain, orthopnea, edema.  GASTROINTESTINAL: positive for nausea, vomiting, no diarrhea , have abdominal pain.  GENITOURINARY: No dysuria, hematuria.  ENDOCRINE: No polyuria, nocturia,  HEMATOLOGY: No anemia, easy bruising or bleeding SKIN: No rash or lesion. MUSCULOSKELETAL: No joint pain or arthritis.   NEUROLOGIC: No tingling, numbness, weakness.  PSYCHIATRY: No anxiety or depression.   MEDICATIONS AT HOME:  Prior to Admission medications   Medication Sig Start Date End Date Taking? Authorizing Provider  amLODipine (NORVASC) 10 MG tablet Take by mouth. 02/12/15  Yes Historical Provider, MD  aspirin EC 81 MG tablet Take by mouth.   Yes Historical Provider, MD  calcium carbonate (OSCAL) 1500 (600 Ca) MG TABS tablet Take 1,500 mg by mouth 2 (two) times daily with a meal.   Yes Historical Provider, MD  DORZOLAMIDE HCL-TIMOLOL MAL OP Place 1 drop into both eyes 2 (two) times daily.   Yes Historical Provider, MD  furosemide (LASIX) 20 MG tablet Take 20 mg by mouth daily as needed.   Yes Historical Provider, MD  hydrochlorothiazide (HYDRODIURIL) 25 MG tablet Take 25 mg by mouth daily.   Yes Historical Provider, MD  ibuprofen (ADVIL,MOTRIN) 200 MG tablet Take 200 mg by mouth every 6 (six) hours as needed.   Yes Historical Provider, MD  levothyroxine (SYNTHROID, LEVOTHROID) 75 MCG tablet Take 75 mcg by mouth daily before breakfast.   Yes Historical Provider, MD  loratadine (CLARITIN) 10 MG tablet Take 10 mg by mouth daily.   Yes Historical Provider, MD  losartan (COZAAR) 100 MG tablet Take 100 mg by mouth daily.   Yes Historical  Provider, MD  lovastatin (MEVACOR) 40 MG tablet Take 40 mg by mouth at bedtime.   Yes Historical Provider, MD  LUMIGAN 0.01 % SOLN  06/06/15  Yes Historical Provider, MD  metoCLOPramide (REGLAN) 10 MG tablet Take 1 tablet (10 mg total) by mouth every 6 (six) hours as needed. 12/03/15  Yes Carrie Mew, MD  metoprolol succinate (TOPROL-XL) 100 MG 24 hr tablet Take 100 mg by mouth 2 (two) times daily. Take with or immediately following a meal.   Yes Historical Provider, MD  Multiple Vitamin (MULTIVITAMIN) tablet Take 1 tablet by mouth daily.   Yes Historical Provider, MD  ondansetron (ZOFRAN ODT) 4 MG disintegrating tablet Take 1 tablet (4 mg total) by mouth every 8 (eight) hours as needed for nausea or vomiting. 12/03/15  Yes Carrie Mew, MD  oxyCODONE-acetaminophen (ROXICET) 5-325 MG tablet Take 1 tablet by mouth every 6 (six) hours as needed for severe pain. 12/03/15  Yes Carrie Mew, MD  pantoprazole (PROTONIX) 40 MG tablet Take 40 mg by mouth daily.   Yes Historical Provider, MD  potassium chloride SA (K-DUR,KLOR-CON) 20 MEQ tablet Take 20 mEq by mouth 2 (two) times daily.   Yes Historical Provider, MD  exemestane (AROMASIN) 25 MG tablet Take 1 tablet (25 mg total) by mouth daily after breakfast. 06/13/15   Forest Gleason, MD      PHYSICAL EXAMINATION:   VITAL SIGNS: Blood pressure (!) 116/51, pulse 93, temperature 98.3 F (36.8 C), temperature source Oral, height 5\' 2"  (1.575 m), weight 94.6 kg (208 lb 9.6 oz), SpO2 94 %.  GENERAL:  75 y.o.-year-old patient lying in the bed with no acute distress.  EYES: Pupils equal, round, reactive to light and accommodation. No scleral icterus. Extraocular muscles intact.  HEENT: Head atraumatic, normocephalic. Oropharynx and nasopharynx clear.  NECK:  Supple, no jugular venous distention. No thyroid enlargement, no tenderness.  LUNGS: Normal breath sounds bilaterally, no wheezing, rales,rhonchi or crepitation. No use of accessory muscles of  respiration.  CARDIOVASCULAR: S1, S2 normal. No murmurs, rubs, or gallops.  ABDOMEN: Soft, mild right side tender, nondistended. Bowel sounds present. No organomegaly or mass.  EXTREMITIES: No pedal edema, cyanosis, or clubbing.  NEUROLOGIC: Cranial nerves II through XII are intact. Muscle strength 5/5 in all extremities. Sensation intact. Gait not checked.  PSYCHIATRIC: The patient is alert and oriented x 3.  SKIN: No obvious rash, lesion, or ulcer.   LABORATORY PANEL:   CBC  Recent Labs Lab 12/03/15 0153  WBC 13.3*  HGB 15.5  HCT 45.1  PLT 285  MCV 89.8  MCH 30.9  MCHC 34.4  RDW 14.0   ------------------------------------------------------------------------------------------------------------------  Chemistries   Recent Labs Lab 12/03/15 0153  NA 141  K 3.6  CL 101  CO2 30  GLUCOSE 166*  BUN 28*  CREATININE 1.49*  CALCIUM 9.5  AST 36  ALT 36  ALKPHOS 110  BILITOT 0.7   ------------------------------------------------------------------------------------------------------------------ estimated creatinine clearance is 33.9 mL/min (by C-G formula based on SCr of 1.49 mg/dL). ------------------------------------------------------------------------------------------------------------------ No results for input(s): TSH, T4TOTAL, T3FREE, THYROIDAB in the last 72 hours.  Invalid input(s): FREET3   Coagulation profile No results for input(s): INR, PROTIME in the last 168 hours. ------------------------------------------------------------------------------------------------------------------- No results for input(s): DDIMER in the last 72 hours. -------------------------------------------------------------------------------------------------------------------  Cardiac Enzymes No results for input(s): CKMB, TROPONINI, MYOGLOBIN in the last 168 hours.  Invalid input(s):  CK ------------------------------------------------------------------------------------------------------------------ Invalid input(s): POCBNP  ---------------------------------------------------------------------------------------------------------------  Urinalysis    Component Value Date/Time   COLORURINE YELLOW (A) 12/03/2015 0153   APPEARANCEUR CLEAR (A) 12/03/2015 0153   APPEARANCEUR Cloudy 06/10/2011 0801   LABSPEC 1.032 (H) 12/03/2015 0153   LABSPEC 1.027 06/10/2011 0801   PHURINE 8.0 12/03/2015 0153   GLUCOSEU NEGATIVE 12/03/2015 0153   GLUCOSEU Negative 06/10/2011 0801   HGBUR 1+ (A) 12/03/2015 0153   BILIRUBINUR NEGATIVE 12/03/2015 0153   BILIRUBINUR Negative 06/10/2011 0801   KETONESUR NEGATIVE 12/03/2015 0153   PROTEINUR NEGATIVE 12/03/2015 0153   NITRITE NEGATIVE 12/03/2015 0153   LEUKOCYTESUR TRACE (A) 12/03/2015 0153   LEUKOCYTESUR 3+ 06/10/2011 0801     RADIOLOGY: Ct Abdomen Pelvis W Contrast  Result Date: 12/03/2015 CLINICAL DATA:  RIGHT lower quadrant pain worsening for 2 days, vomiting. Assess for bowel obstruction or appendicitis. History of breast cancer, diabetes, and hip repair. EXAM: CT ABDOMEN AND PELVIS WITH CONTRAST TECHNIQUE: Multidetector CT imaging of the abdomen and pelvis was performed using the standard protocol following bolus administration of intravenous contrast. CONTRAST:  58mL ISOVUE-300 IOPAMIDOL (ISOVUE-300) INJECTION 61% COMPARISON:  None. FINDINGS: LUNG BASES: Included view of the lung bases are clear. Heart is mildly enlarged. No pericardial effusions. Mild coronary artery calcifications. SOLID ORGANS: The liver, spleen, pancreas and adrenal glands are unremarkable. Numerous gallstones measure up to 12 mm without CT findings of acute cholecystitis. GASTROINTESTINAL TRACT: The stomach, small and large bowel are normal in course and caliber without inflammatory changes. Mild amount of retained large bowel stool. The appendix is not discretely  identified, however there are no inflammatory changes in the right lower quadrant. KIDNEYS/ URINARY TRACT: Kidneys are orthotopic, RIGHT delayed nephrogram. 3 mm RIGHT lower pole nephrolithiasis. Moderate RIGHT hydroureteronephrosis the level of the distal ureter RIGHT 5 mm calculus is present. The RIGHT ureter is decompressed distal to the stone. No LEFT nephrolithiasis or hydronephrosis. 10 mm mildly exophytic LEFT lower pole cyst. No renal masses. Prompt excretion into the proximal LEFT ureteral collecting system. Urinary bladder is decompressed. PERITONEUM/RETROPERITONEUM: Aortoiliac vessels are normal in course and caliber. No lymphadenopathy by CT size criteria. Internal reproductive organs are unremarkable. No intraperitoneal free fluid nor free air. SOFT TISSUE/OSSEOUS STRUCTURES: Non-suspicious. Status post LEFT hip total arthroplasty. LEFT breast scarring. Grade 1 L4-5 anterolisthesis without spondylolysis. L3-4 through L5-S1 moderate to severe degenerative discs and facet arthropathy. Mild canal stenosis L4-5. Severe L3-4 through L5-S1 neural foraminal narrowing. Small fat containing umbilical hernia. IMPRESSION: 1. 5 mm distal RIGHT ureteral calculus resulting in moderate obstructive uropathy and mild RIGHT renal dysfunction. Residual 3 mm RIGHT nephrolithiasis. 2. Cholelithiasis without CT findings of acute cholecystitis. Electronically Signed   By: Elon Alas M.D.   On: 12/03/2015 03:07    EKG: Orders placed or performed during the hospital encounter of 12/03/15  . EKG 12-Lead  . EKG 12-Lead    IMPRESSION AND PLAN:  * Ureteral stone with UTI , hydronephrosis  Appreciated Urology help.    NPO, Iv pain and nausea meds.    IV zosyn and oral flomax.   Sent Urine cx and Blood culture.  * hypertension   Hold meds for now, as BP running lower normal.  * Glaucoma   Continue eye drops.  * hypothyroidism   Cont levothyroxine.   All the records are reviewed and case  discussed with ED provider. Management plans discussed with the patient, family and they are in agreement.  CODE STATUS: Code Status History    This patient does not have a recorded code status. Please follow your organizational policy for patients in this situation.    Advance Directive Documentation   Douglas Duncan Recent Value  Type of Advance Directive  Living will  Pre-existing out of facility DNR order (yellow form or pink Duncan form)  No data  "Duncan" Form in Place?  No data       TOTAL TIME TAKING CARE OF THIS PATIENT: 45 minutes.    Vaughan Basta M.D on 12/03/2015   Between 7am to 6pm - Pager - [phone]  After 6pm go to [email or web address] - password [email or web address] [facility]  Office  [phone]  CC: Primary care physician; Adin Hector, MD   Note: This dictation was prepared with Dragon dictation along with smaller phrase technology. Any transcriptional errors that result from this process are unintentional.

## 2015-12-03 NOTE — Consult Note (Signed)
@ENCDATE @ 5:35 PM   Alyssa Duncan 15-Nov-1940 [id number]  Referring provider: No referring provider defined for this encounter.  No chief complaint on file.   HPI: Admitted with right ureteral stone and rt flank pain and nausea; she was seen this am with no fever or chills; afebrile; Serum Cr 1.49; u/a rare bacteria; ER spoke to Dr Jeffie Pollock last night and was sent home; CT scan noted moderate hydro and distal 5 mm stone right pelvic ureter below iliacs;   Was sent back by primary care physician today for admission; Patient has been having right flank pain since Saturday; vomited today a number of times; has past history of stones and has never had GU surgery; there was a question of low BP and low grade temp; afebrile on admission; I asked for repeat vitals and normotensive and pulse 93; afebrile  Modifying factors: There are no other modifying factors  Associated signs and symptoms: There are no other associated signs and symptoms Aggravating and relieving factors: There are no other aggravating or relieving factors Severity: Moderate Duration: Persistent    PMH: Past Medical History:  Diagnosis Date  . Arthritis   . Asthma   . Back pain   . Breast cancer ([place]) 2013   left breast ductal and lobular carcinoma  . Diabetes mellitus without complication ([place])   . GERD (gastroesophageal reflux disease)   . Hyperlipidemia   . Hypertension   . Hypothyroidism   . Shingles   . Skin cancer   . Sleep apnea     Surgical History: Past Surgical History:  Procedure Laterality Date  . BREAST LUMPECTOMY Left 2013   with rad tx  . CARPAL TUNNEL RELEASE    . CATARACT EXTRACTION    . COLONOSCOPY WITH PROPOFOL N/A 05/10/2015   Procedure: COLONOSCOPY WITH PROPOFOL;  Surgeon: Hulen Luster, MD;  [facility] ENDOSCOPY;  Service: Gastroenterology;  Laterality: N/A;  . JOINT REPLACEMENT    . KNEE ARTHROSCOPY    . MASTECTOMY    . SEPTOPLASTY    . UVULOPALATOPHARYNGOPLASTY     and tongue  surgery    Home Medications:  meds were reviewed  Allergies:  Allergies  Allergen Reactions  . Cardura [Doxazosin]   . Clonidine Derivatives   . Enalapril   . Levaquin [Levofloxacin]   . Mobic [Meloxicam]   . Sulfa Antibiotics   . Vicodin [Hydrocodone-Acetaminophen]     Family History: Family History  Problem Relation Age of Onset  . Breast cancer Mother 56    Social History:  reports that she has never smoked. She has never used smokeless tobacco. She reports that she does not drink alcohol or use drugs.  ROS:  rest of ROS negative                                       Physical Exam: There were no vitals taken for this visit.  Constitutional:  Alert and oriented, No acute distress. HEENT: [place] AT, moist mucus membranes.  Trachea midline, no masses. Cardiovascular: No clubbing, cyanosis, or edema. Respiratory: Normal respiratory effort, no increased work of breathing. GI: Abdomen is soft, nontender, nondistended, no abdominal masses GU: Non toxic; non tender Skin: No rashes, bruises or suspicious lesions. Lymph: No cervical or inguinal adenopathy. Neurologic: Grossly intact, no focal deficits, moving all 4 extremities. Psychiatric: Normal mood and affect.  Laboratory Data: Lab Results  Component Value Date  WBC 13.3 (H) 12/03/2015   HGB 15.5 12/03/2015   HCT 45.1 12/03/2015   MCV 89.8 12/03/2015   PLT 285 12/03/2015    Lab Results  Component Value Date   CREATININE 1.49 (H) 12/03/2015    No results found for: PSA  No results found for: TESTOSTERONE  No results found for: HGBA1C  Urinalysis    Component Value Date/Time   COLORURINE YELLOW (A) 12/03/2015 0153   APPEARANCEUR CLEAR (A) 12/03/2015 0153   APPEARANCEUR Cloudy 06/10/2011 0801   LABSPEC 1.032 (H) 12/03/2015 0153   LABSPEC 1.027 06/10/2011 0801   PHURINE 8.0 12/03/2015 0153   GLUCOSEU NEGATIVE 12/03/2015 0153   GLUCOSEU Negative 06/10/2011 0801   HGBUR 1+ (A)  12/03/2015 0153   BILIRUBINUR NEGATIVE 12/03/2015 0153   BILIRUBINUR Negative 06/10/2011 0801   KETONESUR NEGATIVE 12/03/2015 0153   PROTEINUR NEGATIVE 12/03/2015 0153   NITRITE NEGATIVE 12/03/2015 0153   LEUKOCYTESUR TRACE (A) 12/03/2015 0153   LEUKOCYTESUR 3+ 06/10/2011 0801    Pertinent Imaging: Reviewed the CT scan  Assessment & Plan:  Right ureteral stone; question that patient could be infected; may have had a low great temp at home but was afebrile this am; repeat vitals on admission afebrile; repeat vitals again normal when I examined her; picture drawn  Keep patient NPO Send urine (and blood) for c/s  flomax start   May need stent tomorrow if pain does not settle Hydrate Ok to empirically start antibiotics Spoke with primary team  @DIAGMED @  No Follow-up on file.  Reece Packer, MD  [facility] [address], [place], [place] [address] [phone]

## 2015-12-03 NOTE — ED Notes (Signed)
MD Stafford at bedside. 

## 2015-12-03 NOTE — ED Notes (Signed)
PT c/o right flank pain, with tenderness to right abdomen and back. Pt c/o n/v. Pt reports problems initiating urinary stream, and oliguria.

## 2015-12-03 NOTE — Progress Notes (Signed)
Pharmacy Antibiotic Note  Alyssa Duncan is a 75 y.o. female admitted on 12/03/2015 with a ureteral stone/intra-abdominal infection.Marland Kitchen  Pharmacy has been consulted for Zosyn dosing.  Plan: Zosyn 3.375g IV q8h (4 hour infusion).    Temp (24hrs), Avg:98.2 F (36.8 C), Min:98.2 F (36.8 C), Max:98.2 F (36.8 C)   Recent Labs Lab 12/03/15 0153  WBC 13.3*  CREATININE 1.49*    Estimated Creatinine Clearance: 33.6 mL/min (by C-G formula based on SCr of 1.49 mg/dL).    Allergies  Allergen Reactions  . Cardura [Doxazosin]   . Clonidine Derivatives   . Enalapril   . Levaquin [Levofloxacin]   . Mobic [Meloxicam]   . Sulfa Antibiotics   . Vicodin [Hydrocodone-Acetaminophen]     Antimicrobials this admission: Zosyn 8/28 >>   Dose adjustments this admission: N/A  Microbiology results: 8/28 BCx: pending.  Thank you for allowing pharmacy to be a part of this patient's care.  Olivia Canter, [facility] Clinical Pharmacist 12/03/2015 6:11 PM

## 2015-12-03 NOTE — ED Notes (Signed)
Pt has had no further emesis. Pt denies N/V, itching/rash, SOB

## 2015-12-04 ENCOUNTER — Encounter
Admission: AD | Disposition: A | Discharge status: Home / Self Care | Payer: Self-pay | Source: Ambulatory Visit | Attending: Specialist

## 2015-12-04 ENCOUNTER — Inpatient Hospital Stay: Payer: Medicare Other

## 2015-12-04 ENCOUNTER — Inpatient Hospital Stay: Payer: Medicare Other | Admitting: Anesthesiology

## 2015-12-04 ENCOUNTER — Encounter: Payer: Self-pay | Admitting: *Deleted

## 2015-12-04 DIAGNOSIS — A419 Sepsis, unspecified organism: Secondary | ICD-10-CM

## 2015-12-04 DIAGNOSIS — D494 Neoplasm of unspecified behavior of bladder: Secondary | ICD-10-CM

## 2015-12-04 DIAGNOSIS — G4733 Obstructive sleep apnea (adult) (pediatric): Secondary | ICD-10-CM

## 2015-12-04 DIAGNOSIS — N201 Calculus of ureter: Secondary | ICD-10-CM | POA: Diagnosis not present

## 2015-12-04 DIAGNOSIS — R0600 Dyspnea, unspecified: Secondary | ICD-10-CM

## 2015-12-04 DIAGNOSIS — J9601 Acute respiratory failure with hypoxia: Secondary | ICD-10-CM

## 2015-12-04 HISTORY — PX: CYSTOSCOPY WITH STENT PLACEMENT: SHX5790

## 2015-12-04 LAB — BLOOD GAS, ARTERIAL
Acid-base deficit: 3.3 mmol/L — ABNORMAL HIGH (ref 0.0–2.0)
Allens test (pass/fail): POSITIVE — AB
Bicarbonate: 23.2 mmol/L (ref 20.0–28.0)
Delivery systems: POSITIVE
EXPIRATORY PAP: 5
FIO2: 0.4
Inspiratory PAP: 12
O2 Saturation: 94.5 %
PO2 ART: 80 mmHg — AB (ref 83.0–108.0)
Patient temperature: 37
pCO2 arterial: 46 mmHg (ref 32.0–48.0)
pH, Arterial: 7.31 — ABNORMAL LOW (ref 7.350–7.450)

## 2015-12-04 LAB — BLOOD CULTURE ID PANEL (REFLEXED)
Acinetobacter baumannii: NOT DETECTED
CANDIDA PARAPSILOSIS: NOT DETECTED
CANDIDA TROPICALIS: NOT DETECTED
CARBAPENEM RESISTANCE: NOT DETECTED
Candida albicans: NOT DETECTED
Candida glabrata: NOT DETECTED
Candida krusei: NOT DETECTED
ENTEROBACTER CLOACAE COMPLEX: NOT DETECTED
Enterobacteriaceae species: DETECTED — AB
Enterococcus species: NOT DETECTED
Escherichia coli: DETECTED — AB
HAEMOPHILUS INFLUENZAE: NOT DETECTED
Klebsiella oxytoca: NOT DETECTED
Klebsiella pneumoniae: NOT DETECTED
Listeria monocytogenes: NOT DETECTED
NEISSERIA MENINGITIDIS: NOT DETECTED
PROTEUS SPECIES: NOT DETECTED
Pseudomonas aeruginosa: NOT DETECTED
SERRATIA MARCESCENS: NOT DETECTED
STAPHYLOCOCCUS AUREUS BCID: NOT DETECTED
STAPHYLOCOCCUS SPECIES: NOT DETECTED
STREPTOCOCCUS AGALACTIAE: NOT DETECTED
STREPTOCOCCUS SPECIES: NOT DETECTED
Streptococcus pneumoniae: NOT DETECTED
Streptococcus pyogenes: NOT DETECTED

## 2015-12-04 LAB — CBC
HCT: 37 % (ref 35.0–47.0)
Hemoglobin: 12.5 g/dL (ref 12.0–16.0)
MCH: 30.9 pg (ref 26.0–34.0)
MCHC: 33.6 g/dL (ref 32.0–36.0)
MCV: 91.8 fL (ref 80.0–100.0)
PLATELETS: 147 10*3/uL — AB (ref 150–440)
RBC: 4.04 MIL/uL (ref 3.80–5.20)
RDW: 14.3 % (ref 11.5–14.5)
WBC: 22 10*3/uL — AB (ref 3.6–11.0)

## 2015-12-04 LAB — BASIC METABOLIC PANEL
ANION GAP: 13 (ref 5–15)
Anion gap: 13 (ref 5–15)
BUN: 46 mg/dL — AB (ref 6–20)
BUN: 50 mg/dL — ABNORMAL HIGH (ref 6–20)
CHLORIDE: 100 mmol/L — AB (ref 101–111)
CHLORIDE: 104 mmol/L (ref 101–111)
CO2: 26 mmol/L (ref 22–32)
CO2: 22 mmol/L (ref 22–32)
CREATININE: 3.32 mg/dL — AB (ref 0.44–1.00)
CREATININE: 3.09 mg/dL — AB (ref 0.44–1.00)
Calcium: 7.9 mg/dL — ABNORMAL LOW (ref 8.9–10.3)
Calcium: 7.3 mg/dL — ABNORMAL LOW (ref 8.9–10.3)
GFR calc Af Amer: 14 mL/min — ABNORMAL LOW (ref >60)
GFR calc non Af Amer: 12 mL/min — ABNORMAL LOW (ref >60)
GFR calc non Af Amer: 14 mL/min — ABNORMAL LOW (ref >60)
GFR, EST AFRICAN AMERICAN: 16 mL/min — AB (ref >60)
GLUCOSE: 149 mg/dL — AB (ref 65–99)
Glucose, Bld: 116 mg/dL — ABNORMAL HIGH (ref 65–99)
POTASSIUM: 3.5 mmol/L (ref 3.5–5.1)
Potassium: 4.5 mmol/L (ref 3.5–5.1)
SODIUM: 139 mmol/L (ref 135–145)
SODIUM: 139 mmol/L (ref 135–145)

## 2015-12-04 LAB — GLUCOSE, CAPILLARY: Glucose-Capillary: 149 mg/dL — ABNORMAL HIGH (ref 65–99)

## 2015-12-04 LAB — MRSA PCR SCREENING: MRSA by PCR: NEGATIVE

## 2015-12-04 SURGERY — CYSTOSCOPY, WITH STENT INSERTION
Anesthesia: General | Laterality: Right | Wound class: Clean Contaminated

## 2015-12-04 MED ORDER — LACTATED RINGERS IV SOLN
INTRAVENOUS | Status: DC | PRN
  Administered 2015-12-04: 11:00:00 via INTRAVENOUS

## 2015-12-04 MED ORDER — PROPOFOL 10 MG/ML IV BOLUS
INTRAVENOUS | Status: DC | PRN
  Administered 2015-12-04 (×2): 20 mg via INTRAVENOUS
  Administered 2015-12-04: 30 mg via INTRAVENOUS

## 2015-12-04 MED ORDER — LIDOCAINE HCL 2 % EX GEL
CUTANEOUS | Status: DC | PRN
  Administered 2015-12-04: 1 via URETHRAL

## 2015-12-04 MED ORDER — METHYLPREDNISOLONE 16 MG PO TABS: 16.0000 mg | ORAL_TABLET | Freq: Every day | ORAL | Status: DC

## 2015-12-04 MED ORDER — METHYLPREDNISOLONE 4 MG PO TABS: 4.0000 mg | ORAL_TABLET | Freq: Every day | ORAL | Status: DC

## 2015-12-04 MED ORDER — IPRATROPIUM-ALBUTEROL 0.5-2.5 (3) MG/3ML IN SOLN
RESPIRATORY_TRACT | Status: AC
  Filled 2015-12-04: qty 3

## 2015-12-04 MED ORDER — IOTHALAMATE MEGLUMINE 17.2 % UR SOLN
URETHRAL | Status: DC | PRN
  Administered 2015-12-04: 10 mL via URETHRAL

## 2015-12-04 MED ORDER — SODIUM CHLORIDE 0.9 % IV BOLUS (SEPSIS)
250.0000 mL | Freq: Once | INTRAVENOUS | Status: AC
  Administered 2015-12-04: 250 mL via INTRAVENOUS

## 2015-12-04 MED ORDER — METHYLPREDNISOLONE SODIUM SUCC 125 MG IJ SOLR: 48.0000 mg | Freq: Once | INTRAMUSCULAR | Status: DC

## 2015-12-04 MED ORDER — ACETAMINOPHEN 10 MG/ML IV SOLN
1000.0000 mg | Freq: Once | INTRAVENOUS | Status: AC
  Administered 2015-12-04: 1000 mg via INTRAVENOUS
  Filled 2015-12-04: qty 100

## 2015-12-04 MED ORDER — IPRATROPIUM-ALBUTEROL 0.5-2.5 (3) MG/3ML IN SOLN: 3.0000 mL | Freq: Once | RESPIRATORY_TRACT | Status: DC

## 2015-12-04 MED ORDER — ONDANSETRON HCL 4 MG/2ML IJ SOLN: 4.0000 mg | Freq: Once | INTRAMUSCULAR | Status: DC | PRN

## 2015-12-04 MED ORDER — METHYLPREDNISOLONE 4 MG PO TABS: 8.0000 mg | ORAL_TABLET | Freq: Every day | ORAL | Status: DC

## 2015-12-04 MED ORDER — FUROSEMIDE 10 MG/ML IJ SOLN
40.0000 mg | Freq: Once | INTRAMUSCULAR | Status: AC
  Administered 2015-12-04: 40 mg via INTRAVENOUS

## 2015-12-04 MED ORDER — SODIUM CHLORIDE 0.9 % IV SOLN
1.0000 g | Freq: Two times a day (BID) | INTRAVENOUS | Status: DC
  Administered 2015-12-04 – 2015-12-06 (×4): 1 g via INTRAVENOUS
  Filled 2015-12-04 (×5): qty 1

## 2015-12-04 MED ORDER — METHYLPREDNISOLONE SODIUM SUCC 125 MG IJ SOLR
125.0000 mg | Freq: Once | INTRAMUSCULAR | Status: AC
  Administered 2015-12-04: 125 mg via INTRAVENOUS

## 2015-12-04 MED ORDER — SODIUM CHLORIDE FLUSH 0.9 % IV SOLN
INTRAVENOUS | Status: AC
  Administered 2015-12-04: 14:00:00
  Filled 2015-12-04: qty 3

## 2015-12-04 MED ORDER — MORPHINE SULFATE (PF) 2 MG/ML IV SOLN
2.0000 mg | Freq: Once | INTRAVENOUS | Status: AC
  Administered 2015-12-04: 2 mg via INTRAVENOUS

## 2015-12-04 MED ORDER — LABETALOL HCL 5 MG/ML IV SOLN
INTRAVENOUS | Status: AC
  Filled 2015-12-04: qty 4

## 2015-12-04 MED ORDER — LIDOCAINE HCL 2 % EX GEL
CUTANEOUS | Status: AC
  Filled 2015-12-04: qty 10

## 2015-12-04 MED ORDER — CHLORHEXIDINE GLUCONATE 0.12 % MT SOLN
15.0000 mL | Freq: Two times a day (BID) | OROMUCOSAL | Status: DC
  Administered 2015-12-04 – 2015-12-09 (×10): 15 mL via OROMUCOSAL
  Filled 2015-12-04 (×10): qty 15

## 2015-12-04 MED ORDER — SODIUM CHLORIDE 0.9 % IV SOLN
1.0000 g | Freq: Two times a day (BID) | INTRAVENOUS | Status: DC
  Administered 2015-12-04: 1 g via INTRAVENOUS
  Filled 2015-12-04 (×2): qty 1

## 2015-12-04 MED ORDER — METHYLPREDNISOLONE SODIUM SUCC 125 MG IJ SOLR
INTRAMUSCULAR | Status: AC
  Filled 2015-12-04: qty 2

## 2015-12-04 MED ORDER — OXYCODONE-ACETAMINOPHEN 5-325 MG PO TABS
1.0000 | ORAL_TABLET | ORAL | Status: DC | PRN
  Administered 2015-12-07: 1 via ORAL
  Filled 2015-12-04: qty 1

## 2015-12-04 MED ORDER — FUROSEMIDE 10 MG/ML IJ SOLN
INTRAMUSCULAR | Status: AC
  Filled 2015-12-04: qty 4

## 2015-12-04 MED ORDER — LABETALOL HCL 5 MG/ML IV SOLN
10.0000 mg | Freq: Once | INTRAVENOUS | Status: AC
  Administered 2015-12-04: 10 mg via INTRAVENOUS

## 2015-12-04 MED ORDER — ACETAMINOPHEN 10 MG/ML IV SOLN
INTRAVENOUS | Status: AC
  Administered 2015-12-04: 1000 mg via INTRAVENOUS
  Filled 2015-12-04: qty 100

## 2015-12-04 MED ORDER — FENTANYL CITRATE (PF) 100 MCG/2ML IJ SOLN: 25.0000 ug | INTRAMUSCULAR | Status: DC | PRN

## 2015-12-04 MED ORDER — ORAL CARE MOUTH RINSE
15.0000 mL | Freq: Two times a day (BID) | OROMUCOSAL | Status: DC
  Administered 2015-12-04 – 2015-12-08 (×6): 15 mL via OROMUCOSAL

## 2015-12-04 SURGICAL SUPPLY — 22 items
BAG DRAIN CYSTO-URO LG1000N (MISCELLANEOUS) ×3 IMPLANT
BAG URINE DRAINAGE (UROLOGICAL SUPPLIES) ×3 IMPLANT
CATH FOL 2WAY LX 16X5 (CATHETERS) ×3 IMPLANT
CATH URETL 5X70 OPEN END (CATHETERS) ×3 IMPLANT
CONRAY 43 FOR UROLOGY 50M (MISCELLANEOUS) ×3 IMPLANT
GLOVE BIO SURGEON STRL SZ 6.5 (GLOVE) ×2 IMPLANT
GLOVE BIO SURGEONS STRL SZ 6.5 (GLOVE) ×1
GOWN STRL REUS W/ TWL LRG LVL4 (GOWN DISPOSABLE) ×2 IMPLANT
GOWN STRL REUS W/TWL LRG LVL4 (GOWN DISPOSABLE) ×4
HOLDER FOLEY CATH W/STRAP (MISCELLANEOUS) ×3 IMPLANT
KIT RM TURNOVER CYSTO AR (KITS) ×3 IMPLANT
PACK CYSTO AR (MISCELLANEOUS) ×3 IMPLANT
SENSORWIRE 0.038 NOT ANGLED (WIRE) ×3
SET CYSTO W/LG BORE CLAMP LF (SET/KITS/TRAYS/PACK) ×3 IMPLANT
SOL .9 NS 3000ML IRR  AL (IV SOLUTION) ×2
SOL .9 NS 3000ML IRR UROMATIC (IV SOLUTION) ×1 IMPLANT
STENT URET 6FRX24 CONTOUR (STENTS) IMPLANT
STENT URET 6FRX26 CONTOUR (STENTS) IMPLANT
SURGILUBE 2OZ TUBE FLIPTOP (MISCELLANEOUS) ×3 IMPLANT
SYRINGE IRR TOOMEY STRL 70CC (SYRINGE) IMPLANT
WATER STERILE IRR 1000ML POUR (IV SOLUTION) ×3 IMPLANT
WIRE SENSOR 0.038 NOT ANGLED (WIRE) ×1 IMPLANT

## 2015-12-04 NOTE — Progress Notes (Signed)
Pt. Still in pain. Urologist Dr. Hampton Abbot office notified of pts. Condition.  Spoke with Dr. Allen Kell. He states he hasn't seen pt. And I need to get in touch with Dr. On call at [facility].

## 2015-12-04 NOTE — Progress Notes (Signed)
Spoke with Dr Erlene Quan, updated on pt's inpatient status and urgent urology consult order.

## 2015-12-04 NOTE — Progress Notes (Signed)
Lasix 40 mg iv push ordered by Dr. Ferne Reus.

## 2015-12-04 NOTE — Progress Notes (Signed)
Patient in [place] [address], suddenly became restless, felt like She couldn't breathe and felt like she had to pee.  Dr. Kayleen Memos at bedside, meds given for breathing , Respiratory called and coming to place on  Bi-pap, meds given as ordered by Dr. Kayleen Memos.  Will continue To monitor patient.

## 2015-12-04 NOTE — Progress Notes (Signed)
[facility] NAME: Alyssa Duncan    MR#:  [id number]  DATE OF BIRTH:  1940-10-28  SUBJECTIVE:   Patient is here due to acute kidney injury secondary to nephrolithiasis, and also sepsis. Status post right-sided ureteral stent placement by urology. Patient seen in the PACU and developed respiratory distress and acute respiratory failure with hypoxia secondary to pulmonary edema. Placed on BiPAP, and postoperatively transferred to the stepdown unit.  REVIEW OF SYSTEMS:    Review of Systems  Constitutional: Negative for chills and fever.  HENT: Negative for congestion and tinnitus.   Eyes: Negative for blurred vision and double vision.  Respiratory: Positive for shortness of breath. Negative for cough and wheezing.   Cardiovascular: Negative for chest pain, orthopnea and PND.  Gastrointestinal: Positive for abdominal pain. Negative for diarrhea, nausea and vomiting.  Genitourinary: Negative for dysuria and hematuria.  Neurological: Positive for weakness. Negative for dizziness, sensory change and focal weakness.  All other systems reviewed and are negative.   Nutrition: NPO  Tolerating Diet: No Tolerating PT: Await Eval.   DRUG ALLERGIES:   Allergies  Allergen Reactions  . Cardura [Doxazosin]   . Clonidine Derivatives   . Enalapril   . Levaquin [Levofloxacin]   . Mobic [Meloxicam]   . Sulfa Antibiotics   . Vicodin [Hydrocodone-Acetaminophen]     VITALS:  Blood pressure (!) 100/58, pulse 98, temperature 97.7 F (36.5 C), temperature source Oral, resp. rate (!) 24, height 5\' 2"  (1.575 m), weight 101.3 kg (223 lb 6.4 oz), SpO2 (!) 87 %.  PHYSICAL EXAMINATION:   Physical Exam  GENERAL:  75 y.o.-year-old patient lying in the bed in moderate resp. Distress but following commands.  EYES: Pupils equal, round, reactive to light and accommodation. No scleral icterus. Extraocular muscles intact.  HEENT: Head atraumatic,  normocephalic. Oropharynx and nasopharynx clear.  NECK:  Supple, no jugular venous distention. No thyroid enlargement, no tenderness.  LUNGS: shallow breaths b/l, no wheezing, + bibasilar rales, NO rhonchi. No use of accessory muscles of respiration.  CARDIOVASCULAR: S1, S2 normal. No murmurs, rubs, or gallops.  ABDOMEN: Soft, nontender, nondistended. Bowel sounds present. No organomegaly or mass.  EXTREMITIES: No cyanosis, clubbing or edema b/l.    NEUROLOGIC: Cranial nerves II through XII are intact. No focal Motor or sensory deficits b/l. Globally weak.   PSYCHIATRIC: The patient is alert and oriented x 3.  SKIN: No obvious rash, lesion, or ulcer.    LABORATORY PANEL:   CBC  Recent Labs Lab 12/04/15 0457  WBC 22.0*  HGB 12.5  HCT 37.0  PLT 147*   ------------------------------------------------------------------------------------------------------------------  Chemistries   Recent Labs Lab 12/03/15 1837 12/04/15 0457  NA 138 139  K 3.7 4.5  CL 102 100*  CO2 22 26  GLUCOSE 128* 149*  BUN 33* 46*  CREATININE 2.39* 3.32*  CALCIUM 8.2* 7.9*  AST 44*  --   ALT 31  --   ALKPHOS 102  --   BILITOT 0.7  --    ------------------------------------------------------------------------------------------------------------------  Cardiac Enzymes No results for input(s): TROPONINI in the last 168 hours. ------------------------------------------------------------------------------------------------------------------  RADIOLOGY:  Dg Chest 1 View  Result Date: 12/04/2015 CLINICAL DATA:  Shortness of breath. EXAM: CHEST 1 VIEW COMPARISON:  None. FINDINGS: Cardiomegaly with vascular congestion. Low lung volumes with bibasilar atelectasis. No overt edema or effusions. No acute bony abnormality. IMPRESSION: Low lung volumes with bibasilar atelectasis. Cardiomegaly, vascular congestion. Electronically Signed   By: Rolm Baptise M.D.  On: 12/04/2015 12:45   Ct Abdomen Pelvis W  Contrast  Result Date: 12/03/2015 CLINICAL DATA:  RIGHT lower quadrant pain worsening for 2 days, vomiting. Assess for bowel obstruction or appendicitis. History of breast cancer, diabetes, and hip repair. EXAM: CT ABDOMEN AND PELVIS WITH CONTRAST TECHNIQUE: Multidetector CT imaging of the abdomen and pelvis was performed using the standard protocol following bolus administration of intravenous contrast. CONTRAST:  53mL ISOVUE-300 IOPAMIDOL (ISOVUE-300) INJECTION 61% COMPARISON:  None. FINDINGS: LUNG BASES: Included view of the lung bases are clear. Heart is mildly enlarged. No pericardial effusions. Mild coronary artery calcifications. SOLID ORGANS: The liver, spleen, pancreas and adrenal glands are unremarkable. Numerous gallstones measure up to 12 mm without CT findings of acute cholecystitis. GASTROINTESTINAL TRACT: The stomach, small and large bowel are normal in course and caliber without inflammatory changes. Mild amount of retained large bowel stool. The appendix is not discretely identified, however there are no inflammatory changes in the right lower quadrant. KIDNEYS/ URINARY TRACT: Kidneys are orthotopic, RIGHT delayed nephrogram. 3 mm RIGHT lower pole nephrolithiasis. Moderate RIGHT hydroureteronephrosis the level of the distal ureter RIGHT 5 mm calculus is present. The RIGHT ureter is decompressed distal to the stone. No LEFT nephrolithiasis or hydronephrosis. 10 mm mildly exophytic LEFT lower pole cyst. No renal masses. Prompt excretion into the proximal LEFT ureteral collecting system. Urinary bladder is decompressed. PERITONEUM/RETROPERITONEUM: Aortoiliac vessels are normal in course and caliber. No lymphadenopathy by CT size criteria. Internal reproductive organs are unremarkable. No intraperitoneal free fluid nor free air. SOFT TISSUE/OSSEOUS STRUCTURES: Non-suspicious. Status post LEFT hip total arthroplasty. LEFT breast scarring. Grade 1 L4-5 anterolisthesis without spondylolysis. L3-4 through  L5-S1 moderate to severe degenerative discs and facet arthropathy. Mild canal stenosis L4-5. Severe L3-4 through L5-S1 neural foraminal narrowing. Small fat containing umbilical hernia. IMPRESSION: 1. 5 mm distal RIGHT ureteral calculus resulting in moderate obstructive uropathy and mild RIGHT renal dysfunction. Residual 3 mm RIGHT nephrolithiasis. 2. Cholelithiasis without CT findings of acute cholecystitis. Electronically Signed   By: Elon Alas M.D.   On: 12/03/2015 03:07     ASSESSMENT AND PLAN:   75 year old female with past medical history of central sleep apnea, hypertension, hypothyroidism, hyperlipidemia, GERD, history of breast cancer who presented to the hospital directly admitted for fever, Right flank pain due to nephrolithiasis.   1. Sepsis-secondary to UTI with nephrolithiasis. -Patient's urine and blood cultures are growing Escherichia coli. -Continue IV fluids, IV meropenem. Await identification and sensitivities on the urine and blood cultures.  2. Acute respiratory failure with hypoxia-patient developed respiratory distress postoperatively after right-sided ureteral stent placement. ABG showing some mild hypercarbia/hypoxia. -Suspected to be secondary to flash pulmonary edema. Given some IV Lasix and placed on BiPAP. -Follow clinically and weaning off BiPAP as tolerated.  3. Acute renal failure-secondary to nephrolithiasis/sepsis/UTI. -Continue IV fluids, IV antibiotics. Follow BUN and creatinine and urine output. -Renal dose meds avoid nephrotoxins.  4. Nephrolithiasis-patient had distal right ureteral stone. Appreciate urology input and patient is status post right-sided ureteral stent placement. -Continue further care as per urology.  5. Hypothyroidism-continue Synthroid.  6. Glaucoma-continue dorzolamide/timolol eyedrops.  7. GERD-continue Protonix.  8. Hyperlipidemia-continue Pravachol.   All the records are reviewed and case discussed with Care  Management/Social Worker. Management plans discussed with the patient, family and they are in agreement.  CODE STATUS: Full  DVT Prophylaxis: Heparin subcutaneous  TOTAL Critical Care TIME TAKING CARE OF THIS PATIENT: 40 minutes.   POSSIBLE D/C IN 2-3 DAYS, DEPENDING ON CLINICAL CONDITION.  Henreitta Leber M.D on 12/04/2015 at 3:12 PM  Between 7am to 6pm - Pager - [phone]  After 6pm go to [email or web address] - Proofreader  [facility]  Office  [phone]  CC: Primary care physician; Adin Hector, MD

## 2015-12-04 NOTE — Anesthesia Preprocedure Evaluation (Signed)
Anesthesia Evaluation  Patient identified by MRN, date of birth, ID band Patient awake    Reviewed: Allergy & Precautions, NPO status , Patient's Chart, lab work & pertinent test results  History of Anesthesia Complications Negative for: history of anesthetic complications  Airway Mallampati: III       Dental   Pulmonary asthma , sleep apnea ,           Cardiovascular hypertension, Pt. on medications and Pt. on home beta blockers negative cardio ROS       Neuro/Psych negative neurological ROS     GI/Hepatic Neg liver ROS, GERD  Medicated and Controlled,  Endo/Other  negative endocrine ROSHypothyroidism   Renal/GU negative Renal ROS     Musculoskeletal  (+) Arthritis , Osteoarthritis,    Abdominal   Peds  Hematology negative hematology ROS (+)   Anesthesia Other Findings   Reproductive/Obstetrics                             Anesthesia Physical Anesthesia Plan  ASA: III  Anesthesia Plan: General   Post-op Pain Management:    Induction: Intravenous  Airway Management Planned: Oral ETT  Additional Equipment:   Intra-op Plan:   Post-operative Plan:   Informed Consent: I have reviewed the patients History and Physical, chart, labs and discussed the procedure including the risks, benefits and alternatives for the proposed anesthesia with the patient or authorized representative who has indicated his/her understanding and acceptance.     Plan Discussed with:   Anesthesia Plan Comments:         Anesthesia Quick Evaluation

## 2015-12-04 NOTE — Op Note (Signed)
Date of procedure: 12/04/15  Preoperative diagnosis:  1.  Right obstructing ureteral calculus 2. Right flank pain  Postoperative diagnosis:  1. Same as above 2. Right papillary bladder tumor  Procedure: 1. Cystoscopy 2. Right retrograde pyelogram 3. Right ureteral stent placement  Surgeon: Hollice Espy, MD  Anesthesia: MAC  Complications: None  Intraoperative findings: Right hydroureteronephrosis.  Incidental approximate 2 cm papillary bladder tumor posterior bladder wall.  EBL: minimal  Specimens: None  Drains: 6 x 24 French double-J ureteral stent on right, 60 French Foley catheter  Indication: FREDDY CRUELL is a 75 y.o. patient with a dissecting right ureteral calculus with uncontrolled pain and worsening leukocytosis. She also has evidence of worsening acute renal failure. She was taken to the operating room today for right renal drainage..  After reviewing the management options for treatment, she elected to proceed with the above surgical procedure(s). We have discussed the potential benefits and risks of the procedure, side effects of the proposed treatment, the likelihood of the patient achieving the goals of the procedure, and any potential problems that might occur during the procedure or recuperation. Informed consent has been obtained.  Description of procedure:  The patient was taken to the operating room and MAC was induced.  The patient was placed in the dorsal lithotomy position, prepped and draped in the usual sterile fashion, and preoperative antibiotics were administered. A preoperative time-out was performed.   After using lidocaine jelly, a 21 French rigid cystoscope was advanced per urethra into the bladder. Attention was turned to the right ureteral orifice at which time a wire was placed up presumably to the level of the kidney. There was some resistance at the mid ureter therefore an open-ended ureteral catheter was advanced to level of the mid ureter  and I am gentle retrograde pyelogram was performed which revealed a significantly distended proximal ureter and a hydronephrotic collecting system. The wire was then placed up to level of the kidney under fluoroscopic guidance to the correct position. A 6 x 24 French double-J ureteral stent was then advanced over the wire up to the level of the kidney. The wire was partially withdrawn until the coil of the stent was seen hooking over the lower pole complex. The wire was fully withdrawn and a full coil was noted within the bladder. Incidentally, upon bladder drainage, a papillary tumor was appreciated on the posterior bladder wall measuring approximately 42 cm in place. Unfortunate, due to the lack of general anesthesia, visualization of the remainder of the bladder was somewhat limited. The bladder was then drained using a 16 French Foley catheter with the balloon inflated with 10 cc of sterile water. The patient was cleaned and dried, repositioned the supine position, reversed from anesthesia, taken the PACU in stable condition.  Plan: The Foley should remain in place until renal function improves. Continue antibiotics. Follow-up urine and blood cultures. Incidental finding of a bladder tumor was disclosed the patient's family will discuss further tomorrow with the patient. She will eventually need to have a TURBT, bilateral retrograde pyelogram, mitomycin as well as treatment of her right ureteral stone definitively likely as an outpatient.  Hollice Espy, M.D.

## 2015-12-04 NOTE — Progress Notes (Signed)
Pharmacy Antibiotic Note  Alyssa Duncan is a 75 y.o. female admitted on 12/03/2015 with bacteremia.  Pharmacy has been consulted for Meropenem dosing.   Blood Culture ID Panel (Reflexed) [id number] (Abnormal) Collected: 12/03/15 1837  Order Status: Completed Updated: 12/04/15 0709   Enterococcus species NOT DETECTED   Listeria monocytogenes NOT DETECTED   Staphylococcus species NOT DETECTED   Staphylococcus aureus NOT DETECTED   Streptococcus species NOT DETECTED   Streptococcus agalactiae NOT DETECTED   Streptococcus pneumoniae NOT DETECTED   Streptococcus pyogenes NOT DETECTED   Acinetobacter baumannii NOT DETECTED   Enterobacteriaceae species DETECTED (A)   Comment: CRITICAL RESULT CALLED TO, READ BACK BY AND VERIFIED WITH:  NATE COOKSON AT 0704 12/04/15 SDR      Enterobacter cloacae complex NOT DETECTED   Escherichia coli DETECTED (A)   Comment: CRITICAL RESULT CALLED TO, READ BACK BY AND VERIFIED WITH:  NATE COOKSON AT 0704 12/04/15 SDR      Klebsiella oxytoca NOT DETECTED   Klebsiella pneumoniae NOT DETECTED   Proteus species NOT DETECTED   Serratia marcescens NOT DETECTED   Carbapenem resistance NOT DETECTED   Haemophilus influenzae NOT DETECTED   Neisseria meningitidis NOT DETECTED   Pseudomonas aeruginosa NOT DETECTED   Candida albicans NOT DETECTED   Candida glabrata NOT DETECTED   Candida krusei NOT DETECTED   Candida parapsilosis NOT DETECTED   Candida tropicalis NOT DETECTED     Plan: Patient was on Zosyn 3.375 IV EI. After speaking with Dr. Verdell Carmine will start the meropenem.  Meropenem 1gm IV every 12 hours for CrCl <61ml/min (8/29: 15.2 ml/min)  Height: 5\' 2"  (157.5 cm) Weight: 208 lb 9.6 oz (94.6 kg) IBW/kg (Calculated) : 50.1  Temp (24hrs), Avg:98.1 F (36.7 C), Min:97.4 F (36.3 C), Max:98.4 F (36.9 C)   Recent Labs Lab 12/03/15 0153 12/03/15 1837 12/04/15 0457  WBC 13.3* 11.0 22.0*  CREATININE 1.49* 2.39* 3.32*    Estimated Creatinine  Clearance: 15.2 mL/min (by C-G formula based on SCr of 3.32 mg/dL).    Allergies  Allergen Reactions  . Cardura [Doxazosin]   . Clonidine Derivatives   . Enalapril   . Levaquin [Levofloxacin]   . Mobic [Meloxicam]   . Sulfa Antibiotics   . Vicodin [Hydrocodone-Acetaminophen]     Antimicrobials this admission: Zosyn 3.375 >> 8/28- Meropenem 1gm every 12 hours >> 8/29-   Microbiology results: 8/29: BCx: GRAM NEGATIVE RODS  IN BOTH AEROBIC AND ANAEROBIC BOTTLES  Thank you for allowing pharmacy to be a part of this patient's care.  Nancy Fetter, PharmD Clinical Pharmacist 12/04/2015 8:08 AM

## 2015-12-04 NOTE — Consult Note (Signed)
PULMONARY / CRITICAL CARE MEDICINE   Name: Alyssa Duncan MRN: [id number] DOB: June 15, 1940    ADMISSION DATE:  12/03/2015 CONSULTATION DATE: 12/04/15  REFERRING MD:  Dr. Verdell Carmine  CHIEF COMPLAINT:  Shortness of breath   HISTORY OF PRESENT ILLNESS:   Alyssa Duncan is 75 year old female with past medical history significant for asthma, breast cancer (2013), GERD, hyperlipidemia, hypertension, hypothyroidism, sleep apnea. Patient came to the ED with complaints of nausea, vomiting, abdominal pain on 8/28 and was found to have 5 mm of stone in the ureter. Patient underwent cystoscopy and right ureteral stent placement. Postoperatively patient became short of breath due to pulmonary edema and was started on BiPAP. Patient also received 40 mg of Lasix IV. [facility] M team was consulted for further management. PAST MEDICAL HISTORY :  She  has a past medical history of Arthritis; Asthma; Back pain; Breast cancer ([employer]) (2013); GERD (gastroesophageal reflux disease); Hyperlipidemia; Hypertension; Hypothyroidism; Shingles; Skin cancer; and Sleep apnea.  PAST SURGICAL HISTORY: She  has a past surgical history that includes Breast lumpectomy (Left, 2013); Uvulopalatopharyngoplasty; Septoplasty; Knee arthroscopy; Cataract extraction; Carpal tunnel release; Colonoscopy with propofol (N/A, 05/10/2015); Mastectomy; and Joint replacement (Left).  Allergies  Allergen Reactions  . Cardura [Doxazosin]   . Clonidine Derivatives   . Enalapril   . Levaquin [Levofloxacin]   . Mobic [Meloxicam]   . Sulfa Antibiotics   . Vicodin [Hydrocodone-Acetaminophen]     No current facility-administered medications on file prior to encounter.    Current Outpatient Prescriptions on File Prior to Encounter  Medication Sig  . amLODipine (NORVASC) 10 MG tablet Take by mouth.  Marland Kitchen aspirin EC 81 MG tablet Take by mouth.  . calcium carbonate (OSCAL) 1500 (600 Ca) MG TABS tablet Take 1,500 mg by mouth 2 (two) times daily with a meal.  .  DORZOLAMIDE HCL-TIMOLOL MAL OP Place 1 drop into both eyes 2 (two) times daily.  . furosemide (LASIX) 20 MG tablet Take 20 mg by mouth daily as needed.  . hydrochlorothiazide (HYDRODIURIL) 25 MG tablet Take 25 mg by mouth daily.  Marland Kitchen ibuprofen (ADVIL,MOTRIN) 200 MG tablet Take 200 mg by mouth every 6 (six) hours as needed.  Marland Kitchen levothyroxine (SYNTHROID, LEVOTHROID) 75 MCG tablet Take 75 mcg by mouth daily before breakfast.  . loratadine (CLARITIN) 10 MG tablet Take 10 mg by mouth daily.  Marland Kitchen losartan (COZAAR) 100 MG tablet Take 100 mg by mouth daily.  Marland Kitchen lovastatin (MEVACOR) 40 MG tablet Take 40 mg by mouth at bedtime.  Marland Kitchen LUMIGAN 0.01 % SOLN   . metoCLOPramide (REGLAN) 10 MG tablet Take 1 tablet (10 mg total) by mouth every 6 (six) hours as needed.  . metoprolol succinate (TOPROL-XL) 100 MG 24 hr tablet Take 100 mg by mouth 2 (two) times daily. Take with or immediately following a meal.  . Multiple Vitamin (MULTIVITAMIN) tablet Take 1 tablet by mouth daily.  . ondansetron (ZOFRAN ODT) 4 MG disintegrating tablet Take 1 tablet (4 mg total) by mouth every 8 (eight) hours as needed for nausea or vomiting.  Marland Kitchen oxyCODONE-acetaminophen (ROXICET) 5-325 MG tablet Take 1 tablet by mouth every 6 (six) hours as needed for severe pain.  . pantoprazole (PROTONIX) 40 MG tablet Take 40 mg by mouth daily.  . potassium chloride SA (K-DUR,KLOR-CON) 20 MEQ tablet Take 20 mEq by mouth 2 (two) times daily.  Marland Kitchen exemestane (AROMASIN) 25 MG tablet Take 1 tablet (25 mg total) by mouth daily after breakfast.    FAMILY HISTORY:  Her indicated that the status of her mother is unknown.    SOCIAL HISTORY: She  reports that she has never smoked. She has never used smokeless tobacco. She reports that she does not drink alcohol or use drugs.  REVIEW OF SYSTEMS:   Unable to obtain as the patient is on BiPAP   SUBJECTIVE:  Unable to obtain as the patient is on BiPAP  VITAL SIGNS: BP (!) 106/52 (BP Location: Right Leg)   Pulse  98   Temp 97.4 F (36.3 C)   Resp 20   Ht 5\' 2"  (1.575 m)   Wt 208 lb 9.6 oz (94.6 kg)   SpO2 92%   BMI 38.15 kg/m   HEMODYNAMICS:    VENTILATOR SETTINGS:    INTAKE / OUTPUT: I/O last 3 completed shifts: In: 100 [IV Piggyback:100] Out: -   PHYSICAL EXAMINATION: General:  Elderly white female found on BiPAP, in no acute distress Neuro:  Opens eyes to voice, answers questions appropriately HEENT:  Atraumatic, normocephalic, no discharge, no JVD appreciated Cardiovascular:  S1 and S2, regular rate and rhythm, no MRG noted Lungs: Diminished bases but otherwise clear, no crackles, wheezes, rhonchi noted Abdomen: Soft, nontender, active bowel sounds Musculoskeletal: No inflammation/deformity noted  Skin:  Grossly intact  LABS:  BMET  Recent Labs Lab 12/03/15 0153 12/03/15 1837 12/04/15 0457  NA 141 138 139  K 3.6 3.7 4.5  CL 101 102 100*  CO2 30 22 26   BUN 28* 33* 46*  CREATININE 1.49* 2.39* 3.32*  GLUCOSE 166* 128* 149*    Electrolytes  Recent Labs Lab 12/03/15 0153 12/03/15 1837 12/04/15 0457  CALCIUM 9.5 8.2* 7.9*    CBC  Recent Labs Lab 12/03/15 0153 12/03/15 1837 12/04/15 0457  WBC 13.3* 11.0 22.0*  HGB 15.5 13.4 12.5  HCT 45.1 39.4 37.0  PLT 285 191 147*    Coag's  Recent Labs Lab 12/03/15 1837  INR 1.14    Sepsis Markers No results for input(s): LATICACIDVEN, PROCALCITON, O2SATVEN in the last 168 hours.  ABG  Recent Labs Lab 12/04/15 1210  PHART 7.31*  PCO2ART 46  PO2ART 80*    Liver Enzymes  Recent Labs Lab 12/03/15 0153 12/03/15 1837  AST 36 44*  ALT 36 31  ALKPHOS 110 102  BILITOT 0.7 0.7  ALBUMIN 4.3 3.1*    Cardiac Enzymes No results for input(s): TROPONINI, PROBNP in the last 168 hours.  Glucose  Recent Labs Lab 12/04/15 0724  GLUCAP 149*    Imaging Dg Chest 1 View  Result Date: 12/04/2015 CLINICAL DATA:  Shortness of breath. EXAM: CHEST 1 VIEW COMPARISON:  None. FINDINGS: Cardiomegaly with  vascular congestion. Low lung volumes with bibasilar atelectasis. No overt edema or effusions. No acute bony abnormality. IMPRESSION: Low lung volumes with bibasilar atelectasis. Cardiomegaly, vascular congestion. Electronically Signed   By: Rolm Baptise M.D.   On: 12/04/2015 12:45     STUDIES:  8/28 CT abdomen and pelvis>>5 mm distal RIGHT ureteral calculus resulting in moderateobstructive uropathy and mild RIGHT renal dysfunction. Residual 3 mmRIGHT nephrolithiasis.. Cholelithiasis without CT findings of acute cholecystitis.   CULTURES: 8/28 blood cultures>>1/2(gram negative rods)  ANTIBIOTICS: 8/29 meropenem>>  SIGNIFICANT EVENTS: 8/29 >> patient was admitted to the ICU status post right urethral stent on BiPAP due to respiratory distress.  LINES/TUBES: none  DISCUSSION: 75 year old female status post cystoscopy and right urethral stent  ASSESSMENT / PLAN:  PULMONARY A: Pulmonary edema History of asthma Sleep apnea P:   Continue to  support with oxygen to keep sats greater than 94%  BiPAP as needed Received 40mg  of Lasix in the PACU Bronchodilators   CARDIOVASCULAR A:  History of hypertension  History of hyperlipidemia  P:  Continuous telemetry  Continue Pravachol/lovastatin Continue amlodipine/hydrochlorothiazide,losartan, metoprolol  RENAL A:   Urethral stone status post cystoscopy and right urethral stent placement acute kidney injury related to urethral stone   P:   Urology following  Oral flomax PRN pain medications  BMET in am  GASTROINTESTINAL A:   History of GERD  P:   Nothing by mouth for now Continue Protonix   HEMATOLOGIC A:   No active issues Mild thrombocytopenia P:  SCDs  Heparin for DVT prophylaxis  INFECTIOUS A:   Leukocytosis  Blood cultures positive 1/58for gram-negative rods CBC in am P:   Continue meropenem  Follow cultures CBC in am  ENDOCRINE A:   History of hypothyroidism  P:   Continue Synthroid    NEUROLOGIC A:   No active issues P:   RASS goal: 0 Avoid sedating drugs   Bincy Varughese,AG-ACNP Pulmonary and [place]   12/04/2015, 1:43 PM  STAFF NOTE: I, Dr. Vilinda Boehringer have personally reviewed patient's available data, including medical history, events of note, physical examination and test results as part of my evaluation. I have discussed with resident/NP NP Demaris Callander and other care providers such as pharmacist, RN and RRT.  In addition,  I personally evaluated patient and elicited key findings of   HPI:  75 year old female with past medical history significant for asthma, breast cancer (2013), GERD, hyperlipidemia, hypertension, hypothyroidism, sleep apnea. Patient came to the ED with complaints of nausea, vomiting, abdominal pain on 8/28 and was found to have 5 mm of stone in the ureter. Patient underwent cystoscopy and right ureteral stent placement. Postoperatively patient became short of breath due to pulmonary edema and was started on BiPAP. Patient also received 40 mg of Lasix IV. Became coherent and was placed on 6L Cutter, however after 1 hr became confused and had to be placed back on Bipap, noted to have saturation in the 80s on 6L  initially.   O:  GEN-confused on bipap, no acute distress HEENT-PERRLA CVS-s1, s2, RRR LUNGS-shallow BS, no wheezes at this time ABD-soft, nt, nd MSK-no lesions.    Recent Labs CBC Latest Ref Rng & Units 12/04/2015 12/03/2015 12/03/2015  WBC 3.6 - 11.0 K/uL 22.0(H) 11.0 13.3(H)  Hemoglobin 12.0 - 16.0 g/dL 12.5 13.4 15.5  Hematocrit 35.0 - 47.0 % 37.0 39.4 45.1  Platelets 150 - 440 K/uL 147(L) 191 285      Recent Labs BMP Latest Ref Rng & Units 12/04/2015 12/03/2015 12/03/2015  Glucose 65 - 99 mg/dL 149(H) 128(H) 166(H)  BUN 6 - 20 mg/dL 46(H) 33(H) 28(H)  Creatinine 0.44 - 1.00 mg/dL 3.32(H) 2.39(H) 1.49(H)  Sodium 135 - 145 mmol/L 139 138 141  Potassium 3.5 - 5.1 mmol/L 4.5 3.7  3.6  Chloride 101 - 111 mmol/L 100(L) 102 101  CO2 22 - 32 mmol/L 26 22 30   Calcium 8.9 - 10.3 mg/dL 7.9(L) 8.2(L) 9.5       (The following images and results were reviewed by Dr. Stevenson Clinch on 12/04/2015). Dg Chest 1 View  Result Date: 12/04/2015 CLINICAL DATA:  Shortness of breath. EXAM: CHEST 1 VIEW COMPARISON:  None. FINDINGS: Cardiomegaly with vascular congestion. Low lung volumes with bibasilar atelectasis. No overt edema or effusions. No acute bony abnormality. IMPRESSION: Low lung volumes  with bibasilar atelectasis. Cardiomegaly, vascular congestion. Electronically Signed   By: Rolm Baptise M.D.   On: 12/04/2015 12:45   Ct Abdomen Pelvis W Contrast  Result Date: 12/03/2015 CLINICAL DATA:  RIGHT lower quadrant pain worsening for 2 days, vomiting. Assess for bowel obstruction or appendicitis. History of breast cancer, diabetes, and hip repair. EXAM: CT ABDOMEN AND PELVIS WITH CONTRAST TECHNIQUE: Multidetector CT imaging of the abdomen and pelvis was performed using the standard protocol following bolus administration of intravenous contrast. CONTRAST:  97mL ISOVUE-300 IOPAMIDOL (ISOVUE-300) INJECTION 61% COMPARISON:  None. FINDINGS: LUNG BASES: Included view of the lung bases are clear. Heart is mildly enlarged. No pericardial effusions. Mild coronary artery calcifications. SOLID ORGANS: The liver, spleen, pancreas and adrenal glands are unremarkable. Numerous gallstones measure up to 12 mm without CT findings of acute cholecystitis. GASTROINTESTINAL TRACT: The stomach, small and large bowel are normal in course and caliber without inflammatory changes. Mild amount of retained large bowel stool. The appendix is not discretely identified, however there are no inflammatory changes in the right lower quadrant. KIDNEYS/ URINARY TRACT: Kidneys are orthotopic, RIGHT delayed nephrogram. 3 mm RIGHT lower pole nephrolithiasis. Moderate RIGHT hydroureteronephrosis the level of the distal ureter RIGHT 5 mm  calculus is present. The RIGHT ureter is decompressed distal to the stone. No LEFT nephrolithiasis or hydronephrosis. 10 mm mildly exophytic LEFT lower pole cyst. No renal masses. Prompt excretion into the proximal LEFT ureteral collecting system. Urinary bladder is decompressed. PERITONEUM/RETROPERITONEUM: Aortoiliac vessels are normal in course and caliber. No lymphadenopathy by CT size criteria. Internal reproductive organs are unremarkable. No intraperitoneal free fluid nor free air. SOFT TISSUE/OSSEOUS STRUCTURES: Non-suspicious. Status post LEFT hip total arthroplasty. LEFT breast scarring. Grade 1 L4-5 anterolisthesis without spondylolysis. L3-4 through L5-S1 moderate to severe degenerative discs and facet arthropathy. Mild canal stenosis L4-5. Severe L3-4 through L5-S1 neural foraminal narrowing. Small fat containing umbilical hernia. IMPRESSION: 1. 5 mm distal RIGHT ureteral calculus resulting in moderate obstructive uropathy and mild RIGHT renal dysfunction. Residual 3 mm RIGHT nephrolithiasis. 2. Cholelithiasis without CT findings of acute cholecystitis. Electronically Signed   By: Elon Alas M.D.   On: 12/03/2015 03:07      A:75 yo F s/p cystoscopy with right urethral stenting, with respiratory distress post op  Respiratory distress - hypoxic ? Flash Pulmonary Edema Sleep Apnea Hypotension Sepsis Acute renal failure  P:   - cont with antibiotics, follow up blood cultures - may have had an episode of flash pulmonary edema, however CXR with no significant fluid, lasix 40mg  IV was given - limit sedating meds - cont with pain management as tolerated - cont with NIVPPV for now, will need CPAP at night as she has a hx of OSA.  - restart maintenance IVFs  .  Rest per NP/medical resident whose note is outlined above and that I agree with  The patient is critically ill with multiple organ systems failure and requires high complexity decision making for assessment and support,  frequent evaluation and titration of therapies, application of advanced monitoring technologies and extensive interpretation of multiple databases.   Critical Care Time devoted to patient care services described in this note is  45 Minutes.   This time reflects time of care of this signee Dr Vilinda Boehringer.  This critical care time does not reflect procedure time, or teaching time or supervisory time of PA/NP/Med-student/Med Resident etc but could involve care discussion time.  Vilinda Boehringer, MD [facility] Pager [phone]  please enter 7-digits) On Call Pager [phone] (please enter 7-digits)  Note: This note was prepared with Dragon dictation along with smaller phrase technology. Any transcriptional errors that result from this process are unintentional.

## 2015-12-04 NOTE — Progress Notes (Signed)
Pt. Still hurting after receiving pain med. Dr. Marcille Blanco notified and ordered additional morphine 2mg  IV once.

## 2015-12-04 NOTE — Progress Notes (Signed)
2nd page to Dr Junious Silk placed.

## 2015-12-04 NOTE — Transfer of Care (Signed)
Immediate Anesthesia Transfer of Care Note  Patient: Alyssa Duncan  Procedure(s) Performed: Procedure(s): CYSTOSCOPY WITH STENT PLACEMENT (Right)  Patient Location: PACU  Anesthesia Type:MAC  Level of Consciousness: sedated  Airway & Oxygen Therapy: Patient Spontanous Breathing and Patient connected to face mask oxygen  Post-op Assessment: Report given to RN and Post -op Vital signs reviewed and stable  Post vital signs: Reviewed  Last Vitals:  Vitals:   12/04/15 0911 12/04/15 1133  BP: (!) 113/50 (!) 111/50  Pulse: 93 95  Resp: 16 19  Temp: 36.5 C 37.7 C    Last Pain:  Vitals:   12/04/15 0911  TempSrc: Tympanic  PainSc: 5          Complications: No apparent anesthesia complications

## 2015-12-04 NOTE — Progress Notes (Signed)
Urology Consult Follow Up  Subjective: Called this morning by nurse manager, patient and her family are very anxious to have surgery this morning. Difficulty with pain control overnight. White count and creatinine rising but otherwise hemodynamically stable.  Anti-infectives: Anti-infectives    Start     Dose/Rate Route Frequency Ordered Stop   12/04/15 0900  meropenem (MERREM) 1 g in sodium chloride 0.9 % 100 mL IVPB     1 g 200 mL/hr over 30 Minutes Intravenous Every 12 hours 12/04/15 0757     12/03/15 1900  piperacillin-tazobactam (ZOSYN) IVPB 3.375 g  Status:  Discontinued     3.375 g 12.5 mL/hr over 240 Minutes Intravenous Every 8 hours 12/03/15 1757 12/04/15 0753      Current Facility-Administered Medications  Medication Dose Route Frequency Provider Last Rate Last Dose  . 0.9 %  sodium chloride infusion   Intravenous Continuous Vaughan Basta, MD      . acetaminophen (OFIRMEV) IV 1,000 mg  1,000 mg Intravenous Once Hollice Espy, MD      . acetaminophen (TYLENOL) tablet 650 mg  650 mg Oral Q6H PRN Vaughan Basta, MD      . dorzolamide-timolol (COSOPT) 22.3-6.8 MG/ML ophthalmic solution 1 drop  1 drop Both Eyes BID Vaughan Basta, MD   1 drop at 12/03/15 1948  . heparin injection 5,000 Units  5,000 Units Subcutaneous Q8H Vaughan Basta, MD   5,000 Units at 12/04/15 0456  . levothyroxine (SYNTHROID, LEVOTHROID) tablet 75 mcg  75 mcg Oral QAC breakfast Vaughan Basta, MD   75 mcg at 12/04/15 0456  . loratadine (CLARITIN) tablet 10 mg  10 mg Oral Daily Vaughan Basta, MD   10 mg at 12/03/15 1947  . meropenem (MERREM) 1 g in sodium chloride 0.9 % 100 mL IVPB  1 g Intravenous Q12H Sheema M Hallaji, RPH      . morphine 2 MG/ML injection 2 mg  2 mg Intravenous Q4H PRN Vaughan Basta, MD   2 mg at 12/04/15 0456  . multivitamin with minerals tablet 1 tablet  1 tablet Oral Daily Vaughan Basta, MD      . ondansetron (ZOFRAN) injection  4 mg  4 mg Intravenous Q6H PRN Vaughan Basta, MD   4 mg at 12/04/15 0543  . pantoprazole (PROTONIX) EC tablet 40 mg  40 mg Oral Daily Vaughan Basta, MD   40 mg at 12/03/15 1947  . pravastatin (PRAVACHOL) tablet 40 mg  40 mg Oral q1800 Vaughan Basta, MD      . tamsulosin (FLOMAX) capsule 0.4 mg  0.4 mg Oral Daily Vaughan Basta, MD   0.4 mg at 12/03/15 1948     Objective: Vital signs in last 24 hours: Temp:  [97.4 F (36.3 C)-98.4 F (36.9 C)] 98 F (36.7 C) (08/29 0730) Pulse Rate:  [90-95] 90 (08/29 0730) Resp:  [16-19] 16 (08/29 0613) BP: (98-132)/(42-83) 132/83 (08/29 0730) SpO2:  [91 %-94 %] 92 % (08/29 0730) Weight:  [208 lb 9.6 oz (94.6 kg)] 208 lb 9.6 oz (94.6 kg) (08/28 1703)  Intake/Output from previous day: 08/28 0701 - 08/29 0700 In: 100 [IV Piggyback:100] Out: -  Intake/Output this shift: No intake/output data recorded.   Physical Exam  Constitutional: She is oriented to person, place, and time.  Uncomfortable, confused  HENT:  Head: Normocephalic and atraumatic.  Cardiovascular: Normal rate.   Abdominal: Soft.  Genitourinary:  Genitourinary Comments: Right CVA tenderness  Neurological: She is oriented to person, place, and time.  Skin: Skin is warm and  dry.  Psychiatric: Affect normal.    Lab Results:   Recent Labs  12/03/15 1837 12/04/15 0457  WBC 11.0 22.0*  HGB 13.4 12.5  HCT 39.4 37.0  PLT 191 147*   BMET  Recent Labs  12/03/15 1837 12/04/15 0457  NA 138 139  K 3.7 4.5  CL 102 100*  CO2 22 26  GLUCOSE 128* 149*  BUN 33* 46*  CREATININE 2.39* 3.32*  CALCIUM 8.2* 7.9*   PT/INR  Recent Labs  12/03/15 1837  LABPROT 14.7  INR 1.14   Studies/Results: Ct Abdomen Pelvis W Contrast  Result Date: 12/03/2015 CLINICAL DATA:  RIGHT lower quadrant pain worsening for 2 days, vomiting. Assess for bowel obstruction or appendicitis. History of breast cancer, diabetes, and hip repair. EXAM: CT ABDOMEN AND  PELVIS WITH CONTRAST TECHNIQUE: Multidetector CT imaging of the abdomen and pelvis was performed using the standard protocol following bolus administration of intravenous contrast. CONTRAST:  32mL ISOVUE-300 IOPAMIDOL (ISOVUE-300) INJECTION 61% COMPARISON:  None. FINDINGS: LUNG BASES: Included view of the lung bases are clear. Heart is mildly enlarged. No pericardial effusions. Mild coronary artery calcifications. SOLID ORGANS: The liver, spleen, pancreas and adrenal glands are unremarkable. Numerous gallstones measure up to 12 mm without CT findings of acute cholecystitis. GASTROINTESTINAL TRACT: The stomach, small and large bowel are normal in course and caliber without inflammatory changes. Mild amount of retained large bowel stool. The appendix is not discretely identified, however there are no inflammatory changes in the right lower quadrant. KIDNEYS/ URINARY TRACT: Kidneys are orthotopic, RIGHT delayed nephrogram. 3 mm RIGHT lower pole nephrolithiasis. Moderate RIGHT hydroureteronephrosis the level of the distal ureter RIGHT 5 mm calculus is present. The RIGHT ureter is decompressed distal to the stone. No LEFT nephrolithiasis or hydronephrosis. 10 mm mildly exophytic LEFT lower pole cyst. No renal masses. Prompt excretion into the proximal LEFT ureteral collecting system. Urinary bladder is decompressed. PERITONEUM/RETROPERITONEUM: Aortoiliac vessels are normal in course and caliber. No lymphadenopathy by CT size criteria. Internal reproductive organs are unremarkable. No intraperitoneal free fluid nor free air. SOFT TISSUE/OSSEOUS STRUCTURES: Non-suspicious. Status post LEFT hip total arthroplasty. LEFT breast scarring. Grade 1 L4-5 anterolisthesis without spondylolysis. L3-4 through L5-S1 moderate to severe degenerative discs and facet arthropathy. Mild canal stenosis L4-5. Severe L3-4 through L5-S1 neural foraminal narrowing. Small fat containing umbilical hernia. IMPRESSION: 1. 5 mm distal RIGHT ureteral  calculus resulting in moderate obstructive uropathy and mild RIGHT renal dysfunction. Residual 3 mm RIGHT nephrolithiasis. 2. Cholelithiasis without CT findings of acute cholecystitis. Electronically Signed   By: Elon Alas M.D.   On: 12/03/2015 03:07     Assessment: 75 year old female admitted with right obstructing ureteral calculus with poorly controlled pain overnight, rising white count and acute renal failure. Suspect renal failure is both secondary to obstructive uropathy as well as ATN secondary to IV contrast administration.  Patient is otherwise hemodynamically stable.  Plan: -Recommend urgent right ureteral stent placement today, we'll work him to schedule as we are able to accommodate her  -Plan was discussed with the patient and her family who are all agreeable -Risks and benefits of stent were reviewed extensively, need for further intervention was also discussed -Additional IV Tylenol for the purpose of better pain control, would avoid NSAIDs given her renal failure    LOS: 1 day    Hollice Espy 12/04/2015

## 2015-12-04 NOTE — Progress Notes (Signed)
Per Dr Verdell Carmine patient's MAP goal for now will be 55 or better.  If this cannot be met start levofed at 2 mcgs/minute

## 2015-12-04 NOTE — Anesthesia Postprocedure Evaluation (Signed)
Anesthesia Post Note  Patient: Alyssa Duncan  Procedure(s) Performed: Procedure(s) (LRB): CYSTOSCOPY WITH STENT PLACEMENT (Right)  Patient location during evaluation: PACU Anesthesia Type: General Level of consciousness: awake and alert Pain management: pain level controlled Vital Signs Assessment: post-procedure vital signs reviewed and stable Respiratory status: spontaneous breathing and patient connected to face mask oxygen (pt placed on bipap,) Cardiovascular status: stable Anesthetic complications: no    Last Vitals:  Vitals:   12/04/15 1133 12/04/15 1148  BP: (!) 111/50 (!) 109/59  Pulse: 95 99  Resp: 19 20  Temp: 37.7 C     Last Pain:  Vitals:   12/04/15 1133  TempSrc:   PainSc: Asleep                 Ziya Coonrod K

## 2015-12-04 NOTE — Progress Notes (Signed)
Blood gas results in and Dr. Kayleen Memos aware.  Dr. Erlene Quan Also aware of breathing status and confusion In PACU. Dr. Ferne Reus was going to speak with her family.

## 2015-12-04 NOTE — Progress Notes (Signed)
Dr. Eskridge paged.  

## 2015-12-04 NOTE — Progress Notes (Signed)
Pt moves her head yes that she is feeling some better. Raises her head and looks around from time to time. Have order to transfer to ICU.  Awaiting room number.

## 2015-12-04 NOTE — Progress Notes (Signed)
Dr. Stevenson Clinch in to evaluate patient.  Wants patient to try Trial off bi pap.

## 2015-12-05 ENCOUNTER — Encounter: Payer: Self-pay | Admitting: Urology

## 2015-12-05 ENCOUNTER — Ambulatory Visit: Payer: Self-pay | Admitting: Urology

## 2015-12-05 ENCOUNTER — Telehealth: Payer: Self-pay | Admitting: Urology

## 2015-12-05 DIAGNOSIS — E876 Hypokalemia: Secondary | ICD-10-CM

## 2015-12-05 DIAGNOSIS — N179 Acute kidney failure, unspecified: Secondary | ICD-10-CM

## 2015-12-05 DIAGNOSIS — J9621 Acute and chronic respiratory failure with hypoxia: Secondary | ICD-10-CM

## 2015-12-05 LAB — CBC
HEMATOCRIT: 37.9 % (ref 35.0–47.0)
HEMOGLOBIN: 12.9 g/dL (ref 12.0–16.0)
MCH: 31.3 pg (ref 26.0–34.0)
MCHC: 34.2 g/dL (ref 32.0–36.0)
MCV: 91.6 fL (ref 80.0–100.0)
Platelets: 114 10*3/uL — ABNORMAL LOW (ref 150–440)
RBC: 4.13 MIL/uL (ref 3.80–5.20)
RDW: 14.5 % (ref 11.5–14.5)
WBC: 18.2 10*3/uL — ABNORMAL HIGH (ref 3.6–11.0)

## 2015-12-05 LAB — GLUCOSE, CAPILLARY
GLUCOSE-CAPILLARY: 186 mg/dL — AB (ref 65–99)
GLUCOSE-CAPILLARY: 189 mg/dL — AB (ref 65–99)
GLUCOSE-CAPILLARY: 223 mg/dL — AB (ref 65–99)

## 2015-12-05 LAB — BASIC METABOLIC PANEL
ANION GAP: 12 (ref 5–15)
BUN: 63 mg/dL — ABNORMAL HIGH (ref 6–20)
CHLORIDE: 106 mmol/L (ref 101–111)
CO2: 23 mmol/L (ref 22–32)
CREATININE: 2.6 mg/dL — AB (ref 0.44–1.00)
Calcium: 7.6 mg/dL — ABNORMAL LOW (ref 8.9–10.3)
GFR calc non Af Amer: 17 mL/min — ABNORMAL LOW (ref >60)
GFR, EST AFRICAN AMERICAN: 19 mL/min — AB (ref >60)
Glucose, Bld: 162 mg/dL — ABNORMAL HIGH (ref 65–99)
POTASSIUM: 3.4 mmol/L — AB (ref 3.5–5.1)
SODIUM: 141 mmol/L (ref 135–145)

## 2015-12-05 MED ORDER — DILTIAZEM HCL 25 MG/5ML IV SOLN
2.5000 mg | Freq: Once | INTRAVENOUS | Status: AC
  Administered 2015-12-05: 2.5 mg via INTRAVENOUS
  Filled 2015-12-05: qty 5

## 2015-12-05 MED ORDER — SODIUM CHLORIDE 0.9 % IV SOLN
INTRAVENOUS | Status: DC
  Administered 2015-12-06 (×2): via INTRAVENOUS

## 2015-12-05 MED ORDER — SODIUM CHLORIDE 0.9 % IV BOLUS (SEPSIS)
1000.0000 mL | Freq: Once | INTRAVENOUS | Status: AC
  Administered 2015-12-05: 1000 mL via INTRAVENOUS

## 2015-12-05 MED ORDER — DIGOXIN 125 MCG PO TABS
0.2500 mg | ORAL_TABLET | Freq: Once | ORAL | Status: AC
  Administered 2015-12-05: 0.25 mg via ORAL
  Filled 2015-12-05: qty 2

## 2015-12-05 MED ORDER — INSULIN ASPART 100 UNIT/ML ~~LOC~~ SOLN
0.0000 [IU] | Freq: Three times a day (TID) | SUBCUTANEOUS | Status: DC
  Administered 2015-12-05: 2 [IU] via SUBCUTANEOUS
  Administered 2015-12-06 – 2015-12-07 (×4): 1 [IU] via SUBCUTANEOUS
  Administered 2015-12-08: 2 [IU] via SUBCUTANEOUS
  Administered 2015-12-08: 1 [IU] via SUBCUTANEOUS
  Filled 2015-12-05: qty 5
  Filled 2015-12-05: qty 1
  Filled 2015-12-05: qty 2
  Filled 2015-12-05: qty 1
  Filled 2015-12-05: qty 2
  Filled 2015-12-05 (×2): qty 1

## 2015-12-05 MED ORDER — POTASSIUM CHLORIDE CRYS ER 20 MEQ PO TBCR
40.0000 meq | EXTENDED_RELEASE_TABLET | Freq: Once | ORAL | Status: AC
  Administered 2015-12-05: 40 meq via ORAL
  Filled 2015-12-05: qty 2

## 2015-12-05 MED ORDER — INSULIN ASPART 100 UNIT/ML ~~LOC~~ SOLN
0.0000 [IU] | Freq: Every day | SUBCUTANEOUS | Status: DC
  Administered 2015-12-05: 2 [IU] via SUBCUTANEOUS
  Filled 2015-12-05: qty 2

## 2015-12-05 MED ORDER — METOPROLOL TARTRATE 5 MG/5ML IV SOLN
5.0000 mg | INTRAVENOUS | Status: AC
  Administered 2015-12-05: 5 mg via INTRAVENOUS
  Filled 2015-12-05: qty 5

## 2015-12-05 NOTE — Progress Notes (Signed)
Dr. Manuella Ghazi notified again that patient's HR was still around 130's even after to IV metoprolol.  He put in an order for oral digoxin which I gave to the patient.  Patient still reports that she feels fine.  I gave all of this information to [facility] the night RN that will be taking over her care.

## 2015-12-05 NOTE — Progress Notes (Signed)
Urology Consult Follow Up  Subjective: ICU overnight, briefly on bipap.  Pain significantly improved today.  Overall feeling better.    Anti-infectives: Anti-infectives    Start     Dose/Rate Route Frequency Ordered Stop   12/04/15 1500  meropenem (MERREM) 1 g in sodium chloride 0.9 % 100 mL IVPB     1 g 200 mL/hr over 30 Minutes Intravenous Every 12 hours 12/04/15 1405     12/04/15 0900  meropenem (MERREM) 1 g in sodium chloride 0.9 % 100 mL IVPB  Status:  Discontinued     1 g 200 mL/hr over 30 Minutes Intravenous Every 12 hours 12/04/15 0757 12/04/15 1405   12/03/15 1900  piperacillin-tazobactam (ZOSYN) IVPB 3.375 g  Status:  Discontinued     3.375 g 12.5 mL/hr over 240 Minutes Intravenous Every 8 hours 12/03/15 1757 12/04/15 0753      Current Facility-Administered Medications  Medication Dose Route Frequency Provider Last Rate Last Dose  . acetaminophen (TYLENOL) tablet 650 mg  650 mg Oral Q6H PRN Vaughan Basta, MD      . chlorhexidine (PERIDEX) 0.12 % solution 15 mL  15 mL Mouth Rinse BID Henreitta Leber, MD   15 mL at 12/04/15 2103  . dorzolamide-timolol (COSOPT) 22.3-6.8 MG/ML ophthalmic solution 1 drop  1 drop Both Eyes BID Vaughan Basta, MD   1 drop at 12/04/15 2056  . heparin injection 5,000 Units  5,000 Units Subcutaneous Q8H Vaughan Basta, MD   5,000 Units at 12/05/15 0534  . ipratropium-albuterol (DUONEB) 0.5-2.5 (3) MG/3ML nebulizer solution 3 mL  3 mL Nebulization Once Alvin Critchley, MD      . levothyroxine (SYNTHROID, LEVOTHROID) tablet 75 mcg  75 mcg Oral QAC breakfast Vaughan Basta, MD   75 mcg at 12/05/15 0534  . loratadine (CLARITIN) tablet 10 mg  10 mg Oral Daily Vaughan Basta, MD   10 mg at 12/03/15 1947  . MEDLINE mouth rinse  15 mL Mouth Rinse q12n4p Henreitta Leber, MD   15 mL at 12/04/15 1629  . meropenem (MERREM) 1 g in sodium chloride 0.9 % 100 mL IVPB  1 g Intravenous Q12H Henreitta Leber, MD   1 g at 12/05/15 0534  .  morphine 2 MG/ML injection 2 mg  2 mg Intravenous Q4H PRN Vaughan Basta, MD   2 mg at 12/04/15 2315  . multivitamin with minerals tablet 1 tablet  1 tablet Oral Daily Vaughan Basta, MD      . ondansetron (ZOFRAN) injection 4 mg  4 mg Intravenous Q6H PRN Vaughan Basta, MD   4 mg at 12/04/15 1113  . oxyCODONE-acetaminophen (PERCOCET/ROXICET) 5-325 MG per tablet 1 tablet  1 tablet Oral Q4H PRN Henreitta Leber, MD      . pantoprazole (PROTONIX) EC tablet 40 mg  40 mg Oral Daily Vaughan Basta, MD   40 mg at 12/03/15 1947  . potassium chloride SA (K-DUR,KLOR-CON) CR tablet 40 mEq  40 mEq Oral Once Wilhelmina Mcardle, MD      . pravastatin (PRAVACHOL) tablet 40 mg  40 mg Oral q1800 Vaughan Basta, MD   40 mg at 12/04/15 2056  . tamsulosin (FLOMAX) capsule 0.4 mg  0.4 mg Oral Daily Vaughan Basta, MD   0.4 mg at 12/03/15 1948     Objective: Vital signs in last 24 hours: Temp:  [97.4 F (36.3 C)-100.1 F (37.8 C)] 98.1 F (36.7 C) (08/30 0800) Pulse Rate:  [86-117] 86 (08/30 0700) Resp:  [10-37] 14 (08/30 0700)  BP: (63-180)/(44-147) 111/57 (08/30 0700) SpO2:  [87 %-99 %] 91 % (08/30 0700) FiO2 (%):  [40 %] 40 % (08/29 1700) Weight:  [223 lb 6.4 oz (101.3 kg)] 223 lb 6.4 oz (101.3 kg) (08/29 1500)  Intake/Output from previous day: 08/29 0701 - 08/30 0700 In: 1246.7 [I.V.:1146.7; IV Piggyback:100] Out: 605 [Urine:200; Stool:400; Blood:5] Intake/Output this shift: Total I/O In: -  Out: 1125 [Urine:1125]   Physical Exam  Constitutional: She is oriented to person, place, and time and well-developed, well-nourished, and in no distress.  HENT:  Head: Normocephalic and atraumatic.  Cardiovascular: Normal rate.   Abdominal: Soft.  Genitourinary:  Genitourinary Comments: Foley draining orange tinged urine  Neurological: She is oriented to person, place, and time.  Skin: Skin is warm and dry.  Psychiatric: Affect normal.    Lab Results:   Recent  Labs  12/04/15 0457 12/05/15 0633  WBC 22.0* 18.2*  HGB 12.5 12.9  HCT 37.0 37.9  PLT 147* 114*   BMET  Recent Labs  12/04/15 1518 12/05/15 0633  NA 139 141  K 3.5 3.4*  CL 104 106  CO2 22 23  GLUCOSE 116* 162*  BUN 50* 63*  CREATININE 3.09* 2.60*  CALCIUM 7.3* 7.6*   PT/INR  Recent Labs  12/03/15 1837  LABPROT 14.7  INR 1.14   Studies/Results: Dg Chest 1 View  Result Date: 12/04/2015 CLINICAL DATA:  Shortness of breath. EXAM: CHEST 1 VIEW COMPARISON:  None. FINDINGS: Cardiomegaly with vascular congestion. Low lung volumes with bibasilar atelectasis. No overt edema or effusions. No acute bony abnormality. IMPRESSION: Low lung volumes with bibasilar atelectasis. Cardiomegaly, vascular congestion. Electronically Signed   By: Rolm Baptise M.D.   On: 12/04/2015 12:45     Assessment: 75 year old female admitted with right obstructing ureteral calculus POD #1 s/p right ureteral stent placement.  Brief decompensation post op likely secondary to transient bacteremia.  Remains on meropenem, BCx growing E. Coli/ enterbacter, awaiting sensitivities.  Cr improving, excellent UOP overnight.  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  Plan: -d/c Foley per primary team  -continue iv abx, transition to oral as possible -discussed follow up plan as outpatient, will need to discuss R URS, LL, stent exchange, TURBT, mitomycin, f/u in 1 week after discharge to arrange -all questions answered -urology to sign off, please page with any questions or concerns   LOS: 2 days    Hollice Espy 12/05/2015

## 2015-12-05 NOTE — Progress Notes (Signed)
Nurse notified Asymptomatic Sinus tach around 140s-160s on tele monitor  Ordered 5 mg IV lopressor, but still HR in 120s and BP dropped in 100s, will give digoxin once to see if that helps.  Will also resume Toprol XL 100 mg that patient was on at home if BP tolerates.

## 2015-12-05 NOTE — Progress Notes (Signed)
Notified Dr. Verdell Carmine about FSBS being above 180 (186), and also asked about taking the foley out.  He acknowledged the FSBS and said to removed the foley at 1700 this evening.

## 2015-12-05 NOTE — Progress Notes (Signed)
Pharmacy Antibiotic Note  Alyssa Duncan is a 75 y.o. female admitted on 12/03/2015 with E coli bacteremia.  Pharmacy has been consulted for meropenem dosing.  Plan: Continue meropenem 1 g iv q 12 hours.   Height: 5\' 2"  (157.5 cm) Weight: 223 lb 6.4 oz (101.3 kg) IBW/kg (Calculated) : 50.1  Temp (24hrs), Avg:98.4 F (36.9 C), Min:97.4 F (36.3 C), Max:100.1 F (37.8 C)   Recent Labs Lab 12/03/15 0153 12/03/15 1837 12/04/15 0457 12/04/15 1518 12/05/15 0633  WBC 13.3* 11.0 22.0*  --  18.2*  CREATININE 1.49* 2.39* 3.32* 3.09* 2.60*    Estimated Creatinine Clearance: 20.2 mL/min (by C-G formula based on SCr of 2.6 mg/dL).    Allergies  Allergen Reactions  . Cardura [Doxazosin]   . Clonidine Derivatives   . Enalapril   . Levaquin [Levofloxacin]   . Mobic [Meloxicam]   . Sulfa Antibiotics   . Vicodin [Hydrocodone-Acetaminophen]     Antimicrobials this admission: Zosyn 8/28 >> 8/29 Meropenem 8/29 >>  Dose adjustments this admission:  Microbiology results: 8/28 BCx: GNR 2/2, BCID E coli  Thank you for allowing pharmacy to be a part of this patient's care.  Napoleon Form, [place] Clinical Pharmacist 12/05/2015 9:09 AM

## 2015-12-05 NOTE — Progress Notes (Signed)
She looks great. No new complaints. No distress. Comfortable on Peak O2. Has not used BiPAP overnight.   Vitals:   12/05/15 0800 12/05/15 0830 12/05/15 0930 12/05/15 1030  BP: (!) 114/103 (!) 110/52 (!) 102/49 (!) 113/53  Pulse: 96 100 89 95  Resp: (!) 27 (!) 23 (!) 24 20  Temp: 98.1 F (36.7 C)     TempSrc: Axillary     SpO2: 95% 95% 94% 95%  Weight:      Height:       NAD HEENT WNL No JVD No wheezes Reg, no M NABS, soft No edema  BMP Latest Ref Rng & Units 12/05/2015 12/04/2015 12/04/2015  Glucose 65 - 99 mg/dL 162(H) 116(H) 149(H)  BUN 6 - 20 mg/dL 63(H) 50(H) 46(H)  Creatinine 0.44 - 1.00 mg/dL 2.60(H) 3.09(H) 3.32(H)  Sodium 135 - 145 mmol/L 141 139 139  Potassium 3.5 - 5.1 mmol/L 3.4(L) 3.5 4.5  Chloride 101 - 111 mmol/L 106 104 100(L)  CO2 22 - 32 mmol/L 23 22 26   Calcium 8.9 - 10.3 mg/dL 7.6(L) 7.3(L) 7.9(L)   CBC Latest Ref Rng & Units 12/05/2015 12/04/2015 12/03/2015  WBC 3.6 - 11.0 K/uL 18.2(H) 22.0(H) 11.0  Hemoglobin 12.0 - 16.0 g/dL 12.9 12.5 13.4  Hematocrit 35.0 - 47.0 % 37.9 37.0 39.4  Platelets 150 - 440 K/uL 114(L) 147(L) 191   No new CXR  IMPRESSION: Acute hypoxic respiratory failure after ureteral stent placement - resolving  Likely due to mild edema and atelectasis OSA AKI, Cr improving Hypokalemia  PLAN/REC: Transfer to med-surg Kdur X 40 meq X 1 Cont nocturnal CPAP PCCM will sign off. Please call if we can be of further assistance  Merton Border, MD PCCM service Mobile [phone] Pager [phone] 12/05/2015

## 2015-12-05 NOTE — Progress Notes (Signed)
[facility] at [place] NAME: Alyssa Duncan    MR#:  [id number]  DATE OF BIRTH:  05-21-1940  SUBJECTIVE:   Much better today. Off vasopressors. Hemodynamically stable. Off BiPAP. Right flank pain much improved.  REVIEW OF SYSTEMS:    Review of Systems  Constitutional: Negative for chills and fever.  HENT: Negative for congestion and tinnitus.   Eyes: Negative for blurred vision and double vision.  Respiratory: Negative for cough, shortness of breath and wheezing.   Cardiovascular: Negative for chest pain, orthopnea and PND.  Gastrointestinal: Negative for abdominal pain, diarrhea, nausea and vomiting.  Genitourinary: Negative for dysuria and hematuria.  Neurological: Negative for dizziness, sensory change, focal weakness and weakness.  All other systems reviewed and are negative.   Nutrition: Heart healthy Tolerating Diet: Yes Tolerating PT: Await Eval.   DRUG ALLERGIES:   Allergies  Allergen Reactions  . Cardura [Doxazosin]   . Clonidine Derivatives   . Enalapril   . Levaquin [Levofloxacin]   . Mobic [Meloxicam]   . Sulfa Antibiotics   . Vicodin [Hydrocodone-Acetaminophen]     VITALS:  Blood pressure (!) 119/54, pulse 98, temperature 98.5 F (36.9 C), temperature source Oral, resp. rate 18, height 5\' 2"  (1.575 m), weight 101.3 kg (223 lb 6.4 oz), SpO2 96 %.  PHYSICAL EXAMINATION:   Physical Exam  GENERAL:  75 y.o.-year-old patient sitting in chair in NAD EYES: Pupils equal, round, reactive to light and accommodation. No scleral icterus. Extraocular muscles intact.  HEENT: Head atraumatic, normocephalic. Oropharynx and nasopharynx clear.  NECK:  Supple, no jugular venous distention. No thyroid enlargement, no tenderness.  LUNGS: Good air entry bilaterally, no rales, rhonchi, wheezes. Negative use of accessory muscles. CARDIOVASCULAR: S1, S2 normal. No murmurs, rubs, or gallops.  ABDOMEN: Soft, nontender, nondistended. Bowel  sounds present. No organomegaly or mass.  EXTREMITIES: No cyanosis, clubbing or edema b/l.    NEUROLOGIC: Cranial nerves II through XII are intact. No focal Motor or sensory deficits b/l. PSYCHIATRIC: The patient is alert and oriented x 3.  SKIN: No obvious rash, lesion, or ulcer.    LABORATORY PANEL:   CBC  Recent Labs Lab 12/05/15 0633  WBC 18.2*  HGB 12.9  HCT 37.9  PLT 114*   ------------------------------------------------------------------------------------------------------------------  Chemistries   Recent Labs Lab 12/03/15 1837  12/05/15 0633  NA 138  < > 141  K 3.7  < > 3.4*  CL 102  < > 106  CO2 22  < > 23  GLUCOSE 128*  < > 162*  BUN 33*  < > 63*  CREATININE 2.39*  < > 2.60*  CALCIUM 8.2*  < > 7.6*  AST 44*  --   --   ALT 31  --   --   ALKPHOS 102  --   --   BILITOT 0.7  --   --   < > = values in this interval not displayed. ------------------------------------------------------------------------------------------------------------------  Cardiac Enzymes No results for input(s): TROPONINI in the last 168 hours. ------------------------------------------------------------------------------------------------------------------  RADIOLOGY:  Dg Chest 1 View  Result Date: 12/04/2015 CLINICAL DATA:  Shortness of breath. EXAM: CHEST 1 VIEW COMPARISON:  None. FINDINGS: Cardiomegaly with vascular congestion. Low lung volumes with bibasilar atelectasis. No overt edema or effusions. No acute bony abnormality. IMPRESSION: Low lung volumes with bibasilar atelectasis. Cardiomegaly, vascular congestion. Electronically Signed   By: Rolm Baptise M.D.   On: 12/04/2015 12:45     ASSESSMENT AND PLAN:   75 year old female with  past medical history of central sleep apnea, hypertension, hypothyroidism, hyperlipidemia, GERD, history of breast cancer who presented to the hospital directly admitted for fever, Right flank pain due to nephrolithiasis.   1. Sepsis-secondary to  UTI with nephrolithiasis. -Patient's urine and blood cultures are growing Escherichia coli. -Continue IV fluids, IV meropenem. Await identification and sensitivities.    2. Acute respiratory failure with hypoxia-patient developed respiratory distress postoperatively after right-sided ureteral stent placement.  -Suspected to be secondary to flash pulmonary edema and improved Bipap and Lasix.  - off bipap today and clinically doing well.    3. Acute renal failure-secondary to nephrolithiasis/sepsis/UTI. -Continue IV fluids, IV Meropenem -Improving and continue supportive care. Status post right-sided ureteral stent placement. Appreciate urology input  4. Nephrolithiasis-patient had distal right ureteral stone. Appreciate urology input and patient is status post right-sided ureteral stent placement POD #1.  - right flank pain much improved.  - d/c foley later today.  5. Hypothyroidism-continue Synthroid.  6. Glaucoma-continue dorzolamide/timolol eyedrops.  7. GERD-continue Protonix.  8. Hyperlipidemia-continue Pravachol.  Transfer to floor today.    All the records are reviewed and case discussed with Care Management/Social Worker. Management plans discussed with the patient, family and they are in agreement.  CODE STATUS: Full  DVT Prophylaxis: Heparin subcutaneous  TOTAL Critical Care TIME TAKING CARE OF THIS PATIENT: 30 minutes.   POSSIBLE D/C IN 1-2 DAYS, DEPENDING ON CLINICAL CONDITION.   Henreitta Leber M.D on 12/05/2015 at 2:00 PM  Between 7am to 6pm - Pager - [phone]  After 6pm go to [email or web address] - Proofreader  [facility]  Office  [phone]  CC: Primary care physician; Adin Hector, MD

## 2015-12-05 NOTE — Progress Notes (Signed)
Central Telemetry notified me that the patient's HR was jumping up sinus tachycardic around 140s-160s.  Assessed the patient and she was not feeling any different just felt a little more short of breath.  Dr. Manuella Ghazi was notified of the HR.  He gave a verbal order for IV Metoprolol.  I then checked a BP which was 101/59 and he said to still give it.

## 2015-12-05 NOTE — Progress Notes (Signed)
Report called to [facility], patient to transfer shortly. No complaints at this time, family at beside. Updated on plan of care. Wilnette Kales

## 2015-12-05 NOTE — Telephone Encounter (Signed)
To: Amy Linton Flemings, RN, Hollice Espy, MD, *  Subject: RE: f/u after discharge              Could not reach the patient and she was already schd for an appt with dr Junious Silk for 12-05-15  Sharyn Lull   ----- Message -----  From: Hollice Espy, MD  Sent: 12/05/2015  8:34 AM  To: Ranell Patrick, RN, Gerhard Perches  Subject: f/u after discharge                I need to see this patient in ~10-14 days (1-2 weeks after discharge), still currently inpatient.   She will need to be booked for R URS, LL, stent exchange, medium TURBT, mitomycin, bilateral RTG   Hollice Espy, MD

## 2015-12-06 ENCOUNTER — Inpatient Hospital Stay: Admission: AD | Admit: 2015-12-06 | Payer: Medicare Other | Source: Ambulatory Visit | Admitting: Internal Medicine

## 2015-12-06 DIAGNOSIS — A419 Sepsis, unspecified organism: Secondary | ICD-10-CM

## 2015-12-06 DIAGNOSIS — I4891 Unspecified atrial fibrillation: Secondary | ICD-10-CM

## 2015-12-06 DIAGNOSIS — R7881 Bacteremia: Secondary | ICD-10-CM

## 2015-12-06 DIAGNOSIS — R410 Disorientation, unspecified: Secondary | ICD-10-CM

## 2015-12-06 DIAGNOSIS — R0603 Acute respiratory distress: Secondary | ICD-10-CM

## 2015-12-06 LAB — BASIC METABOLIC PANEL
ANION GAP: 6 (ref 5–15)
BUN: 60 mg/dL — AB (ref 6–20)
CHLORIDE: 112 mmol/L — AB (ref 101–111)
CO2: 24 mmol/L (ref 22–32)
Calcium: 7.6 mg/dL — ABNORMAL LOW (ref 8.9–10.3)
Creatinine, Ser: 1.48 mg/dL — ABNORMAL HIGH (ref 0.44–1.00)
GFR calc Af Amer: 38 mL/min — ABNORMAL LOW (ref >60)
GFR calc non Af Amer: 33 mL/min — ABNORMAL LOW (ref >60)
GLUCOSE: 153 mg/dL — AB (ref 65–99)
POTASSIUM: 3.6 mmol/L (ref 3.5–5.1)
Sodium: 142 mmol/L (ref 135–145)

## 2015-12-06 LAB — CBC
HEMATOCRIT: 36.3 % (ref 35.0–47.0)
HEMOGLOBIN: 12.3 g/dL (ref 12.0–16.0)
MCH: 30.7 pg (ref 26.0–34.0)
MCHC: 33.9 g/dL (ref 32.0–36.0)
MCV: 90.7 fL (ref 80.0–100.0)
Platelets: 108 10*3/uL — ABNORMAL LOW (ref 150–440)
RBC: 4 MIL/uL (ref 3.80–5.20)
RDW: 14.6 % — ABNORMAL HIGH (ref 11.5–14.5)
WBC: 20.4 10*3/uL — ABNORMAL HIGH (ref 3.6–11.0)

## 2015-12-06 LAB — MAGNESIUM: Magnesium: 1.9 mg/dL (ref 1.7–2.4)

## 2015-12-06 LAB — GLUCOSE, CAPILLARY
GLUCOSE-CAPILLARY: 117 mg/dL — AB (ref 65–99)
GLUCOSE-CAPILLARY: 149 mg/dL — AB (ref 65–99)
GLUCOSE-CAPILLARY: 133 mg/dL — AB (ref 65–99)
GLUCOSE-CAPILLARY: 120 mg/dL — AB (ref 65–99)
GLUCOSE-CAPILLARY: 124 mg/dL — AB (ref 65–99)
Glucose-Capillary: 148 mg/dL — ABNORMAL HIGH (ref 65–99)
Glucose-Capillary: 181 mg/dL — ABNORMAL HIGH (ref 65–99)

## 2015-12-06 LAB — CULTURE, BLOOD (ROUTINE X 2)

## 2015-12-06 LAB — TROPONIN I: Troponin I: 0.14 ng/mL (ref <0.03)

## 2015-12-06 LAB — APTT: APTT: 26 s (ref 24–36)

## 2015-12-06 LAB — HEPARIN LEVEL (UNFRACTIONATED): Heparin Unfractionated: <0.1 IU/mL — ABNORMAL LOW (ref 0.30–0.70)

## 2015-12-06 LAB — TSH: TSH: 2.91 u[IU]/mL (ref 0.350–4.500)

## 2015-12-06 LAB — LACTIC ACID, PLASMA: Lactic Acid, Venous: 1.1 mmol/L (ref 0.5–1.9)

## 2015-12-06 MED ORDER — METOPROLOL TARTRATE 5 MG/5ML IV SOLN
2.5000 mg | INTRAVENOUS | Status: DC | PRN
Start: 2015-12-06 — End: 2015-12-09
  Administered 2015-12-07: 2.5 mg via INTRAVENOUS
  Administered 2015-12-07 (×3): 5 mg via INTRAVENOUS
  Administered 2015-12-07: 2.5 mg via INTRAVENOUS
  Filled 2015-12-06 (×5): qty 5

## 2015-12-06 MED ORDER — DILTIAZEM HCL 25 MG/5ML IV SOLN
2.5000 mg | Freq: Once | INTRAVENOUS | Status: AC
  Administered 2015-12-06: 2.5 mg via INTRAVENOUS
  Filled 2015-12-06: qty 5

## 2015-12-06 MED ORDER — HEPARIN (PORCINE) IN NACL 100-0.45 UNIT/ML-% IJ SOLN
1000.0000 [IU]/h | INTRAMUSCULAR | Status: DC
  Administered 2015-12-06: 1000 [IU]/h via INTRAVENOUS
  Filled 2015-12-06 (×2): qty 250

## 2015-12-06 MED ORDER — HALOPERIDOL LACTATE 5 MG/ML IJ SOLN
1.0000 mg | Freq: Four times a day (QID) | INTRAMUSCULAR | Status: DC | PRN
  Administered 2015-12-06: 1 mg via INTRAVENOUS
  Filled 2015-12-06: qty 1

## 2015-12-06 MED ORDER — LORAZEPAM 2 MG/ML IJ SOLN
1.0000 mg | Freq: Once | INTRAMUSCULAR | Status: AC
  Administered 2015-12-06: 1 mg via INTRAVENOUS
  Filled 2015-12-06: qty 1

## 2015-12-06 MED ORDER — ENOXAPARIN SODIUM 100 MG/ML ~~LOC~~ SOLN
1.0000 mg/kg | Freq: Two times a day (BID) | SUBCUTANEOUS | Status: DC
  Administered 2015-12-06 – 2015-12-09 (×6): 100 mg via SUBCUTANEOUS
  Filled 2015-12-06 (×6): qty 1

## 2015-12-06 MED ORDER — DEXTROSE 5 % IV SOLN
500.0000 mg | Freq: Three times a day (TID) | INTRAVENOUS | Status: DC
  Filled 2015-12-06 (×2): qty 5

## 2015-12-06 MED ORDER — AMIODARONE HCL IN DEXTROSE 360-4.14 MG/200ML-% IV SOLN
30.0000 mg/h | INTRAVENOUS | Status: DC
  Administered 2015-12-06 (×2): 30 mg/h via INTRAVENOUS
  Administered 2015-12-07: 60 mg/h via INTRAVENOUS
  Filled 2015-12-06 (×4): qty 200

## 2015-12-06 MED ORDER — LACTATED RINGERS IV SOLN
INTRAVENOUS | Status: DC
  Administered 2015-12-06: 50 mL/h via INTRAVENOUS
  Administered 2015-12-07: 04:00:00 via INTRAVENOUS

## 2015-12-06 MED ORDER — LORAZEPAM 2 MG/ML IJ SOLN: 1.0000 mg | Freq: Four times a day (QID) | INTRAMUSCULAR | Status: DC | PRN

## 2015-12-06 MED ORDER — AMIODARONE LOAD VIA INFUSION
150.0000 mg | Freq: Once | INTRAVENOUS | Status: AC
  Administered 2015-12-06: 150 mg via INTRAVENOUS
  Filled 2015-12-06: qty 83.34

## 2015-12-06 MED ORDER — DILTIAZEM HCL 100 MG IV SOLR
5.0000 mg/h | INTRAVENOUS | Status: DC
  Administered 2015-12-06: 5 mg/h via INTRAVENOUS
  Filled 2015-12-06: qty 100

## 2015-12-06 MED ORDER — SODIUM CHLORIDE 0.9 % IV BOLUS (SEPSIS)
1000.0000 mL | Freq: Once | INTRAVENOUS | Status: AC
  Administered 2015-12-06: 1000 mL via INTRAVENOUS

## 2015-12-06 MED ORDER — AMIODARONE HCL IN DEXTROSE 360-4.14 MG/200ML-% IV SOLN
60.0000 mg/h | INTRAVENOUS | Status: AC
  Administered 2015-12-06: 60 mg/h via INTRAVENOUS
  Filled 2015-12-06: qty 400
  Filled 2015-12-06: qty 200

## 2015-12-06 MED ORDER — CEFAZOLIN IN D5W 1 GM/50ML IV SOLN
1.0000 g | Freq: Three times a day (TID) | INTRAVENOUS | Status: DC
  Administered 2015-12-06 – 2015-12-09 (×9): 1 g via INTRAVENOUS
  Filled 2015-12-06 (×13): qty 50

## 2015-12-06 NOTE — Progress Notes (Signed)
[place] at [place] NAME: Alyssa Duncan    MR#:  [id number]  DATE OF BIRTH:  March 01, 1941  SUBJECTIVE:   Patient developed some atrial fibrillation with RVR overnight and also became delirious and was transferred to the intensive care unit. Currently on amiodarone drip. Family at bedside. Patient remains alert but confused at times  REVIEW OF SYSTEMS:    Review of Systems  Unable to perform ROS: Mental acuity    Nutrition: Heart healthy Tolerating Diet: Yes Tolerating PT: Await Eval.  DRUG ALLERGIES:   Allergies  Allergen Reactions  . Cardura [Doxazosin] Other (See Comments)    unsure  . Clonidine Derivatives Other (See Comments)    unsure  . Enalapril Other (See Comments)    unsure  . Levaquin [Levofloxacin] Other (See Comments)    Couldn't raise her arms  . Mobic [Meloxicam] Other (See Comments)    unsure  . Sulfa Antibiotics Nausea Only  . Vicodin [Hydrocodone-Acetaminophen] Other (See Comments)    unsure    VITALS:  Blood pressure 114/65, pulse 83, temperature 97.9 F (36.6 C), temperature source Axillary, resp. rate 20, height 5\' 2"  (1.575 m), weight 101.3 kg (223 lb 6.4 oz), SpO2 95 %.  PHYSICAL EXAMINATION:   Physical Exam  GENERAL:  75 y.o.-year-old patient lying in bed confused/lethargic.  EYES: Pupils equal, round, reactive to light and accommodation. No scleral icterus. Extraocular muscles intact.  HEENT: Head atraumatic, normocephalic. Oropharynx and nasopharynx clear.  NECK:  Supple, no jugular venous distention. No thyroid enlargement, no tenderness.  LUNGS: Good air entry bilaterally, no rales, rhonchi, wheezes. Negative use of accessory muscles. CARDIOVASCULAR: S1, S2 normal. No murmurs, rubs, or gallops.  ABDOMEN: Soft, nontender, nondistended. Bowel sounds present. No organomegaly or mass.  EXTREMITIES: No cyanosis, clubbing or edema b/l.    NEUROLOGIC: Cranial nerves II through XII are intact. No focal  Motor or sensory deficits b/l. PSYCHIATRIC: The patient is alert and oriented x 1.  SKIN: No obvious rash, lesion, or ulcer.    LABORATORY PANEL:   CBC  Recent Labs Lab 12/06/15 0302  WBC 20.4*  HGB 12.3  HCT 36.3  PLT 108*   ------------------------------------------------------------------------------------------------------------------  Chemistries   Recent Labs Lab 12/03/15 1837  12/06/15 0302  NA 138  < > 142  K 3.7  < > 3.6  CL 102  < > 112*  CO2 22  < > 24  GLUCOSE 128*  < > 153*  BUN 33*  < > 60*  CREATININE 2.39*  < > 1.48*  CALCIUM 8.2*  < > 7.6*  MG  --   --  1.9  AST 44*  --   --   ALT 31  --   --   ALKPHOS 102  --   --   BILITOT 0.7  --   --   < > = values in this interval not displayed. ------------------------------------------------------------------------------------------------------------------  Cardiac Enzymes  Recent Labs Lab 12/06/15 0621  TROPONINI 0.14*   ------------------------------------------------------------------------------------------------------------------  RADIOLOGY:  No results found.   ASSESSMENT AND PLAN:   75 year old female with past medical history of central sleep apnea, hypertension, hypothyroidism, hyperlipidemia, GERD, history of breast cancer who presented to the hospital directly admitted for fever, Right flank pain due to nephrolithiasis.   1. Sepsis-secondary to UTI with nephrolithiasis. -Patient's urine and blood cultures are growing Escherichia coli. - afebrile, hemodynamically stable.  E. Coli is pansensitive and switched to IV ancef for now.   2. Acute delirium-etiology  unclear but suspected to be secondary to underlying sepsis, sundowning. -Patient received some Ativan which may have made it somewhat worse. -Continue as needed Haldol, avoid benzodiazepines. Follow mental status.  3. Atrial fibrillation with rapid ventricular response-secondary to underlying sepsis -Appreciate cardiology input,  started on amiodarone drip. -Continue oral metoprolol. Follow echocardiogram.  4. Acute respiratory failure with hypoxia-patient developed respiratory distress postoperatively after right-sided ureteral stent placement.  -Suspected to be secondary to flash pulmonary edema and improved Bipap and Lasix.  - resolved and stable from Resp. Standpoint.   5. Acute renal failure-secondary to nephrolithiasis/sepsis/UTI. -Continue IV fluids, IV Ancef -Improving and continue supportive care. Status post right-sided ureteral stent placement. Appreciate urology input  6. Nephrolithiasis-patient had distal right ureteral stone. Appreciate urology input and patient is status post right-sided ureteral stent placement POD #2.  - cont. Further care as per Urology. Will need stent removed as outpatient.  - foley removed and voiding well.   6. Hypothyroidism-continue Synthroid.  7. Glaucoma-continue dorzolamide/timolol eyedrops.  8. GERD-continue Protonix.  9. Hyperlipidemia-continue Pravachol.  Await PT eval.   All the records are reviewed and case discussed with Care Management/Social Worker. Management plans discussed with the patient, family and they are in agreement.  CODE STATUS: Full  DVT Prophylaxis: Heparin subcutaneous  TOTAL Critical Care TIME TAKING CARE OF THIS PATIENT: 35 minutes.   POSSIBLE D/C IN 2-3 DAYS, DEPENDING ON CLINICAL CONDITION.   Henreitta Leber M.D on 12/06/2015 at 3:29 PM  Between 7am to 6pm - Pager - [phone]  After 6pm go to [email or web address] - Proofreader  [facility]  Office  [phone]  CC: Primary care physician; Adin Hector, MD

## 2015-12-06 NOTE — Progress Notes (Signed)
MD paged

## 2015-12-06 NOTE — Progress Notes (Signed)
Patient had elevated heart rate overnight. EKG revealed afib with rapid rate. Blood pressure was low and patient transferred to step down unit. Was given bolus of iv fluid and blood pressure improved. Will start patient on iv diltiazem drip for rate control.IV ativan prn ordered for agitation.

## 2015-12-06 NOTE — Progress Notes (Signed)
Agitated.  Refusing care. Refusing lab draw.  Spoke with Dr Estanislado Pandy about Pt's agitation.  New orders received.  Will continue to monitor.

## 2015-12-06 NOTE — Progress Notes (Signed)
Pt in A. fib, HR 140-150s. MD gave orders for 3rd 1 L NS bolus, 2.5 IV cardizem, cardiology consult, stat EKG, BMP, Mg, lactic acid, and transfer pt off unit.

## 2015-12-06 NOTE — Progress Notes (Signed)
HR continues to be elevated, new onset A. fib with RVR per telemetry tech. Pt remains asymptomatic. MD notified, given orders for 2nd 1 L NS bolus, 2.5 mg IV cardizem, and maintenance IVF NS @ 75. Will continue to monitor pt.

## 2015-12-06 NOTE — Progress Notes (Signed)
HR elevated to 140-150s after administration of IV metoprolol and PO digoxin by the day RN. Dr Manuella Ghazi notified, ordered a 1 L bolus of NS. Will continue to monitor pt.

## 2015-12-06 NOTE — Progress Notes (Signed)
Called to do stat EKG on patient. Pt off home CPAP unit and on room air. Pt placed on 2L . Pt seems confused. Several RN's & techs in room cleaning pt and preparing to transfer. Pt states that she's ok.

## 2015-12-06 NOTE — Progress Notes (Signed)
ANTICOAGULATION CONSULT NOTE - Initial Consult  Pharmacy Consult for heparin Indication: atrial fibrillation  Allergies  Allergen Reactions  . Cardura [Doxazosin] Other (See Comments)    unsure  . Clonidine Derivatives Other (See Comments)    unsure  . Enalapril Other (See Comments)    unsure  . Levaquin [Levofloxacin] Other (See Comments)    Couldn't raise her arms  . Mobic [Meloxicam] Other (See Comments)    unsure  . Sulfa Antibiotics Nausea Only  . Vicodin [Hydrocodone-Acetaminophen] Other (See Comments)    unsure    Patient Measurements: Height: 5\' 2"  (157.5 cm) Weight: 223 lb 6.4 oz (101.3 kg) IBW/kg (Calculated) : 50.1 Heparin Dosing Weight: 74.2 kg  Vital Signs: Temp: 98.9 F (37.2 C) (08/31 0303) Temp Source: Oral (08/31 0303) BP: 101/62 (08/31 0303) Pulse Rate: 146 (08/31 0303)  Labs:  Recent Labs  12/03/15 1837 12/04/15 0457 12/04/15 1518 12/05/15 0633  HGB 13.4 12.5  --  12.9  HCT 39.4 37.0  --  37.9  PLT 191 147*  --  114*  LABPROT 14.7  --   --   --   INR 1.14  --   --   --   CREATININE 2.39* 3.32* 3.09* 2.60*    Estimated Creatinine Clearance: 20.2 mL/min (by C-G formula based on SCr of 2.6 mg/dL).   Medical History: Past Medical History:  Diagnosis Date  . Arthritis   . Asthma   . Back pain   . Breast cancer ([place]) 2013   left breast ductal and lobular carcinoma  . GERD (gastroesophageal reflux disease)   . Hyperlipidemia   . Hypertension   . Hypothyroidism   . Shingles   . Skin cancer   . Sleep apnea     Medications:  Infusions:  . sodium chloride 75 mL/hr at 12/06/15 0013  . heparin      Assessment: 21 yof with AF after digoxin and IV lopressor. Pharmacy consulted to dose heparin for AF. Patient has received multiple, appropriately timed LDUH SQ - will not bolus now.  Goal of Therapy:  Heparin level 0.3-0.7 units/ml Monitor platelets by anticoagulation protocol: Yes   Plan:  Start heparin infusion at 1000  units/hr Check anti-Xa level in 8 hours and daily while on heparin Continue to monitor H&H and platelets  Laural Benes, Pharm.D., BCPS Clinical Pharmacist 12/06/2015,3:17 AM

## 2015-12-06 NOTE — Progress Notes (Signed)
PULMONARY / CRITICAL CARE MEDICINE   Name: Alyssa Duncan MRN: [id number] DOB: 1941/01/25    ADMISSION DATE:  12/03/2015 CONSULTATION DATE:  12/04/15  REFERRING MD: Dr. Verdell Carmine  CHIEF COMPLAINT: SOB  Patient profile:  75year old female female with s/p cystoscopy and right urethral stent on 8/29, was in respiratory failure and required BiPAP.  On 8/30 patient was transferred to the floor on nasal canula.  Patient developed AFib with RVR.  Patient was transferred back to the ICU. She received 1 liter fluid bolus and was diltiazem drip for rate control.  VITAL SIGNS: BP 119/90   Pulse (!) 147   Temp 99.1 F (37.3 C) (Oral)   Resp 19   Ht 5\' 2"  (1.575 m)   Wt 223 lb 6.4 oz (101.3 kg)   SpO2 92%   BMI 40.86 kg/m   HEMODYNAMICS:    VENTILATOR SETTINGS:    INTAKE / OUTPUT: I/O last 3 completed shifts: In: 2470 [P.O.:240; I.V.:2230] Out: 2525 [Urine:2525]  PHYSICAL EXAMINATION: General: elderly white female looks extremely tired  Neuro:  Opens eyes to voice and answers questions when prompted but is not able to keep her eyes open for sustained period of time. HEENT:  Atraumatic, normocephalic, no discharge, no JVD appreciated Cardiovascular: irregularly irregular,, no MRG noted Lungs:  Clear bilaterally, no wheezes, crackles, rhonchi noted Abdomen:  Soft, non tender, active bowel sounds Musculoskeletal:  No inflammation/deformity noted Skin:  Grossly intact  LABS:  BMET  Recent Labs Lab 12/04/15 1518 12/05/15 0633 12/06/15 0302  NA 139 141 142  K 3.5 3.4* 3.6  CL 104 106 112*  CO2 22 23 24   BUN 50* 63* 60*  CREATININE 3.09* 2.60* 1.48*  GLUCOSE 116* 162* 153*    Electrolytes  Recent Labs Lab 12/04/15 1518 12/05/15 0633 12/06/15 0302  CALCIUM 7.3* 7.6* 7.6*  MG  --   --  1.9    CBC  Recent Labs Lab 12/04/15 0457 12/05/15 0633 12/06/15 0302  WBC 22.0* 18.2* 20.4*  HGB 12.5 12.9 12.3  HCT 37.0 37.9 36.3  PLT 147* 114* 108*     Coag's  Recent Labs Lab 12/03/15 1837 12/06/15 0612  APTT  --  26  INR 1.14  --     Sepsis Markers  Recent Labs Lab 12/06/15 0302  LATICACIDVEN 1.1    ABG  Recent Labs Lab 12/04/15 1210  PHART 7.31*  PCO2ART 46  PO2ART 80*    Liver Enzymes  Recent Labs Lab 12/03/15 0153 12/03/15 1837  AST 36 44*  ALT 36 31  ALKPHOS 110 102  BILITOT 0.7 0.7  ALBUMIN 4.3 3.1*    Cardiac Enzymes  Recent Labs Lab 12/06/15 0621  TROPONINI 0.14*    Glucose  Recent Labs Lab 12/05/15 1211 12/05/15 1628 12/05/15 2033 12/06/15 0342 12/06/15 0716 12/06/15 1150  GLUCAP 186* 189* 223* 149* 148* 181*    Imaging No results found.   STUDIES:  8/28 CT abdomen and pelvis>>5 mm distal RIGHT ureteral calculus resulting in moderateobstructive uropathy and mild RIGHT renal dysfunction. Residual 3 mmRIGHT nephrolithiasis.. Cholelithiasis without CT findings of acute cholecystitis.    CULTURES: 8/28 blood cultures>>1/2(gram negative rods)  ANTIBIOTICS: 8/29 meropenem>>  SIGNIFICANT EVENTS: 8//29 >> patient was admitted to the ICU status post right urethral stent on BiPAP due to respiratory distress. 8/30 >Transferred to the floor 8/31> Back in ICU with afib with RVR LINES/TUBES: none  ASSESSMENT / PLAN:  PULMONARY A: History of asthma Sleep apnea P:   Support with  Oxygen to keep sats >94% CPAP prn  CARDIOVASCULAR A:  New onset of Afib with RVR Hypokalemia History of hypertension  History of hyperlipidemia  P:  P:  Continue Heparin gtt Continue Amiodarone Cardiology following PRN IV metoprolol has also been ordered for rate control ECHO once her HR is better controlled Replace electrolytes per ICU protocol Continue pravacol RENAL A:   Urethral stone status post cystoscopy and right urethral stent placement acute kidney injury related to urethral stone   P:   Urology following  Oral flomax PRN pain medications  BMET in  am  GASTROINTESTINAL A:   History of GERD  P:    Continue protonix  HEMATOLOGIC A:   No active issues P:  Heparin for DVT prophylaxis  INFECTIOUS A:   Leukocytosis  Blood cultures positive 1/31for gram-negative rods CBC in am P:   Continue ceftriaxone Follow cultures CBC in am  ENDOCRINE A:   History of hypothyroidism    hyperglycemia P:   HH Carb modified diet Blood sugar checks with meals. SSI Coverage Continue Synthroid  NEUROLOGIC A:   No active issues P:   RASS goal: 0 Avoid sedating drugs    Bincy Varughese,AG-ACNP Pulmonary & Critical Care Pulmonary and [place]   12/06/2015, 1:08 PM  PCCM ATTENDING: I have reviewed pt's initial presentation, consultants notes and hospital database in detail.  The above assessment and plan was formulated under my direction.   Merton Border, MD;  PCCM service; Mobile [phone]

## 2015-12-06 NOTE — Progress Notes (Signed)
Lab called with a critical troponin of 0.14. Bincy, NP notified and stated that troponin's need to be trended and continue to monitor. No new orders at this time. Will continue to monitor.

## 2015-12-06 NOTE — Care Management Note (Addendum)
Case Management Note  Patient Details  Name: Alyssa Duncan MRN: [id number] Date of Birth: Sep 08, 1940  Subjective/Objective:                   Met with patient and two daughters- patient was attempting to get out of bed to go to the bathroom. Patient seemed to be agitated with her family trying to keep her in the bed (to prevent fall). Two daughters I met with were Belenda Cruise [phone] and Marlowe Kays [phone]. There is a son named Ed which is not present. Patient lives with her husband Clair Gulling [phone] (also not present) and daughters state that he holds HCPOA. They state that confusion is new as patient was independent with daily activities prior to admission. She has a rolling walker from previous hip surgery with Dr. Claiborne Billings in [place] however she was not using it. They are unsure what home health agency provided HHPT at that time. She is not on home O2. Her PCP is Dr. Ernst Spell with [facility]. They state that she gets her medications from [facility] [phone] without difficulty.   Action/Plan:  Home health list provided. PT would be helpful. RNCM will continue to follow.   Expected Discharge Date:                  Expected Discharge Plan:     In-House Referral:     Discharge planning Services  CM Consult  Post Acute Care Choice:  Home Health, Durable Medical Equipment Choice offered to:  Patient, Adult Children  DME Arranged:    DME Agency:     HH Arranged:    [place] Agency:     Status of Service:  In process, will continue to follow  If discussed at Long Length of Stay Meetings, dates discussed:    Additional Comments:  Marshell Garfinkel, RN 12/06/2015, 11:07 AM

## 2015-12-06 NOTE — Consult Note (Signed)
Cardiology Consultation Note  Patient ID: Alyssa Duncan, MRN: [id number], DOB/AGE: [birth year] 77 y.o. Admit date: 12/03/2015   Date of Consult: 12/06/2015 Primary Physician: Adin Hector, MD Primary Cardiologist: New to [facility] Requesting Physician: Dr. Ara Kussmaul, DO  Chief Complaint: Abdominal pain found to have renal stone Reason for Consult: New onset Afib with RVR  HPI: 75 y.o. female with h/o HTN, HLD, asthma, OSA on CPAP, hypothyroidism, left breast cancer, and kidney stone who presented to [facility] with abdominal pain and was found to have sepsis with bacteremia secondary to a 5 mm right ureter stone with UTI, acute respiratory distress with hypoxia post-operatively s/p right ureteral stent placement, AKI, and newly developed Afib with RVR on 12/05/15.   She does not have any previously known cardiac history and has never seen a cardiologist before. She presented to [facility] on 8/28 with abdominal pain, nausea and vomiting. She was found to have a 5 mm ureteral stone now s/p ureter stenting by Urology. Post operatively she developed acute respiratory distress leading to being placed on BiPAP, now on [place]. Her urine cultures and blood cultures are both growing E coli. Her ABX is managed by PCCM. Her pain is improving. She has been somewhat agitated requiring Haldol. WBC count elevated at 20,000 this morning. K+ noted to be low at 3.6 this morning now s/p repletion. TSH pending. She went into Afib with RVR with heart rate ranging from the 140's to 160's bpm at 18:44 on 12/05/15. She could not feel any palpitations nor did her SOB worsen at that time. She was also noted to be hypotensive at that time and was given IV fluids with improvement in BP. She was started on IV diltiazem gtt. Heart rate has remained in the 140's to 160's bpm since going into Afib with RVR. BP stable at this time in the 1-teens systolic.   Per her daughters the patient has been noting increased SOB and easy fatigue-ability over the  past 2 weeks. Never with complaints of palpitations.   Past Medical History:  Diagnosis Date  . Arthritis   . Asthma   . Back pain   . Breast cancer ([place]) 2013   left breast ductal and lobular carcinoma  . GERD (gastroesophageal reflux disease)   . Hyperlipidemia   . Hypertension   . Hypothyroidism   . Shingles   . Skin cancer   . Sleep apnea       Most Recent Cardiac Studies: Echo 08/15/2014: INTERPRETATION Normal Stress Echocardiogram NORMAL RIGHT VENTRICULAR SYSTOLIC FUNCTION MILD VALVULAR REGURGITATION (See above) NO VALVULAR STENOSIS NOTED   Surgical History:  Past Surgical History:  Procedure Laterality Date  . BREAST LUMPECTOMY Left 2013   with rad tx  . CARPAL TUNNEL RELEASE    . CATARACT EXTRACTION    . COLONOSCOPY WITH PROPOFOL N/A 05/10/2015   Procedure: COLONOSCOPY WITH PROPOFOL;  Surgeon: Hulen Luster, MD;  Location: [facility] ENDOSCOPY;  Service: Gastroenterology;  Laterality: N/A;  . CYSTOSCOPY WITH STENT PLACEMENT Right 12/04/2015   Procedure: CYSTOSCOPY WITH STENT PLACEMENT;  Surgeon: Hollice Espy, MD;  Location: [facility] ORS;  Service: Urology;  Laterality: Right;  . JOINT REPLACEMENT Left    hip  . KNEE ARTHROSCOPY    . MASTECTOMY    . SEPTOPLASTY    . UVULOPALATOPHARYNGOPLASTY     and tongue surgery     Home Meds: Prior to Admission medications   Medication Sig Start Date End Date Taking? Authorizing Provider  amLODipine (NORVASC) 10 MG  tablet Take 10 mg by mouth daily.  02/12/15  Yes Historical Provider, MD  aspirin EC 81 MG tablet Take 81 mg by mouth daily.    Yes Historical Provider, MD  calcium carbonate (OSCAL) 1500 (600 Ca) MG TABS tablet Take 1,500 mg by mouth 2 (two) times daily with a meal.   Yes Historical Provider, MD  DORZOLAMIDE HCL-TIMOLOL MAL OP Place 1 drop into both eyes 2 (two) times daily.   Yes Historical Provider, MD  exemestane (AROMASIN) 25 MG tablet Take 1 tablet (25 mg total) by mouth daily after breakfast. 06/13/15  Yes Forest Gleason,  MD  furosemide (LASIX) 20 MG tablet Take 20 mg by mouth daily as needed.   Yes Historical Provider, MD  hydrochlorothiazide (HYDRODIURIL) 25 MG tablet Take 25 mg by mouth daily.   Yes Historical Provider, MD  ibuprofen (ADVIL,MOTRIN) 200 MG tablet Take 400 mg by mouth every 6 (six) hours as needed.    Yes Historical Provider, MD  levothyroxine (SYNTHROID, LEVOTHROID) 75 MCG tablet Take 75 mcg by mouth daily before breakfast.   Yes Historical Provider, MD  loratadine (CLARITIN) 10 MG tablet Take 10 mg by mouth daily.   Yes Historical Provider, MD  losartan (COZAAR) 100 MG tablet Take 100 mg by mouth daily.   Yes Historical Provider, MD  lovastatin (MEVACOR) 40 MG tablet Take 40 mg by mouth at bedtime.   Yes Historical Provider, MD  LUMIGAN 0.01 % SOLN Place 1 drop into both eyes at bedtime.  06/06/15  Yes Historical Provider, MD  metoprolol succinate (TOPROL-XL) 100 MG 24 hr tablet Take 100 mg by mouth 2 (two) times daily. Take with or immediately following a meal.   Yes Historical Provider, MD  Multiple Vitamin (MULTIVITAMIN) tablet Take 1 tablet by mouth daily.   Yes Historical Provider, MD  pantoprazole (PROTONIX) 40 MG tablet Take 40 mg by mouth daily.   Yes Historical Provider, MD  potassium chloride SA (K-DUR,KLOR-CON) 20 MEQ tablet Take 20 mEq by mouth 2 (two) times daily.   Yes Historical Provider, MD  metoCLOPramide (REGLAN) 10 MG tablet Take 1 tablet (10 mg total) by mouth every 6 (six) hours as needed. 12/03/15   Carrie Mew, MD  ondansetron (ZOFRAN ODT) 4 MG disintegrating tablet Take 1 tablet (4 mg total) by mouth every 8 (eight) hours as needed for nausea or vomiting. 12/03/15   Carrie Mew, MD  oxyCODONE-acetaminophen (ROXICET) 5-325 MG tablet Take 1 tablet by mouth every 6 (six) hours as needed for severe pain. 12/03/15   Carrie Mew, MD    Inpatient Medications:  . amiodarone  150 mg Intravenous Once  .  ceFAZolin (ANCEF) IV  500 mg Intravenous Q8H  . chlorhexidine  15  mL Mouth Rinse BID  . dorzolamide-timolol  1 drop Both Eyes BID  . insulin aspart  0-5 Units Subcutaneous QHS  . insulin aspart  0-9 Units Subcutaneous TID WC  . levothyroxine  75 mcg Oral QAC breakfast  . mouth rinse  15 mL Mouth Rinse q12n4p  . multivitamin with minerals  1 tablet Oral Daily  . pantoprazole  40 mg Oral Daily  . pravastatin  40 mg Oral q1800   . amiodarone     Followed by  . amiodarone    . heparin 1,000 Units/hr (12/06/15 0739)  . lactated ringers      Allergies:  Allergies  Allergen Reactions  . Cardura [Doxazosin] Other (See Comments)    unsure  . Clonidine Derivatives Other (See Comments)  unsure  . Enalapril Other (See Comments)    unsure  . Levaquin [Levofloxacin] Other (See Comments)    Couldn't raise her arms  . Mobic [Meloxicam] Other (See Comments)    unsure  . Sulfa Antibiotics Nausea Only  . Vicodin [Hydrocodone-Acetaminophen] Other (See Comments)    unsure    Social History   Social History  . Marital status: Married    Spouse name: N/A  . Number of children: N/A  . Years of education: N/A   Occupational History  . Not on file.   Social History Main Topics  . Smoking status: Never Smoker  . Smokeless tobacco: Never Used  . Alcohol use No  . Drug use: No  . Sexual activity: No   Other Topics Concern  . Not on file   Social History Narrative  . No narrative on file     Family History  Problem Relation Age of Onset  . Breast cancer Mother 35     Review of Systems: Review of Systems  Constitutional: Positive for malaise/fatigue. Negative for chills, diaphoresis, fever and weight loss.  HENT: Negative for congestion.   Eyes: Negative for discharge and redness.  Respiratory: Positive for shortness of breath. Negative for cough, sputum production and wheezing.   Cardiovascular: Negative for chest pain, palpitations, orthopnea, claudication, leg swelling and PND.  Gastrointestinal: Positive for abdominal pain and nausea.  Negative for heartburn and vomiting.  Genitourinary: Negative for hematuria.  Musculoskeletal: Negative for falls and myalgias.  Skin: Negative for rash.  Neurological: Positive for weakness. Negative for dizziness, tingling, tremors, sensory change, speech change, focal weakness and loss of consciousness.  Endo/Heme/Allergies: Does not bruise/bleed easily.  Psychiatric/Behavioral: Negative for substance abuse. The patient is not nervous/anxious.   All other systems reviewed and are negative.   Labs: No results for input(s): CKTOTAL, CKMB, TROPONINI in the last 72 hours. Lab Results  Component Value Date   WBC 20.4 (H) 12/06/2015   HGB 12.3 12/06/2015   HCT 36.3 12/06/2015   MCV 90.7 12/06/2015   PLT 108 (L) 12/06/2015    Recent Labs Lab 12/03/15 1837  12/06/15 0302  NA 138  < > 142  K 3.7  < > 3.6  CL 102  < > 112*  CO2 22  < > 24  BUN 33*  < > 60*  CREATININE 2.39*  < > 1.48*  CALCIUM 8.2*  < > 7.6*  PROT 6.4*  --   --   BILITOT 0.7  --   --   ALKPHOS 102  --   --   ALT 31  --   --   AST 44*  --   --   GLUCOSE 128*  < > 153*  < > = values in this interval not displayed. No results found for: CHOL, HDL, LDLCALC, TRIG No results found for: DDIMER  Radiology/Studies:  Dg Chest 1 View  Result Date: 12/04/2015 IMPRESSION: Low lung volumes with bibasilar atelectasis. Cardiomegaly, vascular congestion. Electronically Signed   By: Rolm Baptise M.D.   On: 12/04/2015 12:45   Ct Abdomen Pelvis W Contrast  Result Date: 12/03/2015 IMPRESSION: 1. 5 mm distal RIGHT ureteral calculus resulting in moderate obstructive uropathy and mild RIGHT renal dysfunction. Residual 3 mm RIGHT nephrolithiasis. 2. Cholelithiasis without CT findings of acute cholecystitis. Electronically Signed   By: Elon Alas M.D.   On: 12/03/2015 03:07    EKG: Interpreted by me showed: 12/03/15 - NSR, 83 bpm, nonspecific st/t changes. 12/06/15 - Afib  with RVR, 142 bpm, nonspecific st/t changes    Telemetry: Interpreted by me showed: New onset Afib with RVR at 6:44 PM on 12/05/15. Heart rates have been in the 140's to 160's bpm since.   Weights: Filed Weights   12/03/15 1703 12/04/15 1500  Weight: 208 lb 9.6 oz (94.6 kg) 223 lb 6.4 oz (101.3 kg)     Physical Exam: Blood pressure 119/90, pulse (!) 147, temperature 99.1 F (37.3 C), temperature source Oral, resp. rate 19, height 5\' 2"  (1.575 m), weight 223 lb 6.4 oz (101.3 kg), SpO2 92 %. Body mass index is 40.86 kg/m. General: Well developed, well nourished, in no acute distress. Head: Normocephalic, atraumatic, sclera non-icteric, no xanthomas, nares are without discharge.  Neck: Negative for carotid bruits. JVD not elevated. Lungs: Decreased breath sounds bilaterally. Breathing is unlabored. Heart: Tachycardic, irregularly-irregular with S1 S2. No murmurs, rubs, or gallops appreciated. Abdomen: Soft, non-tender, non-distended with normoactive bowel sounds. No hepatomegaly. No rebound/guarding. No obvious abdominal masses. Msk:  Strength and tone appear normal for age. Extremities: No clubbing or cyanosis. No edema. Distal pedal pulses are 2+ and equal bilaterally. Neuro: Alert and oriented X 3. No facial asymmetry. No focal deficit. Moves all extremities spontaneously. Psych:  Responds to questions appropriately with a normal affect.    Assessment and Plan:  Principal Problem:   Sepsis ([place]) Active Problems:   Ureteral stone   Bacteremia   Acute respiratory distress ([place])   New onset atrial fibrillation (H[facility])   AKI (acute kidney injury) ([place])   Hypokalemia    1. New onset Afib with RVR: -Went into Afib with RVR with heart rates in the 140's 160's bpm at 6:44 Pm on 8/30 and has remained there since -IV diltiazem gtt has not been effective at rate control at this time with eart rates running in the 120's to 140's as of this morning -Her soft BP has precluded further titration of IV diltiazem gtt from greater than 5  mg/hr -Given she is still within a 48-hour window of going into Afib we will change her to IV amiodarone with bolus being given over 60 minutes in an effort to prevent hypotension -PRN IV metoprolol has also been ordered for rate control -Ultimately, her rate may be somewhat difficult to control in the acute illness and pain with renal stones, UTI, sepsis, and bacteremia. As these issues improve her heart rate will likely improve as well -Would check echo given her new onset Afib and SOB once her heart rate is better controlled -Hypokalemia has been repleted, maintain 4.0 -TSH pending -Troponin pending, though unless significantly elevated any elevation likely 2/2 demand ischemia in the setting of Afib with RVR, sepsis, and bacteremia  -On heparin gtt for stroke prevention -CHADS2VASc at least 4 (HTN, age x 2, female) -Continue heparin gtt at this time in the event she requires intervention  -This appears to be an isolated episode of Afib in the setting of all of the above. Should this persist or should she redevelop Afib would need to have long term, full-dose anticoagulation with DOAC. Will discuss with MD  2. Sepsis in the setting of UTI and bacteremia 2/2 ureteral stone s/p ureteral stenting: -Stable -Per PCCM  3. AKI: -Improved -Likely in the setting of #2  4. Acute respiratory distress with hypoxia in the perioperative time period: -Improved -Now on [place] -Check echo as above -SOB was present prior to the onset of Afib, though cannot rule out PAF preceding the above at home given  her functional decline over the prior several weeks  5. Hypokalemia: -Repleted this AM -Recheck bmet later today   Signed, Christell Faith, PA-C [place] Pager: [phone] 12/06/2015, 10:28 AM

## 2015-12-06 NOTE — Progress Notes (Signed)
Pt placed home nasal CPAP. No sign of damage to machine or power cord. CPAP plugged into red outlet. Family at bedside and doesn't have any questions or needs.

## 2015-12-07 ENCOUNTER — Inpatient Hospital Stay: Admission: AD | Admit: 2015-12-07 | Payer: Medicare Other | Source: Ambulatory Visit | Admitting: Internal Medicine

## 2015-12-07 DIAGNOSIS — N390 Urinary tract infection, site not specified: Secondary | ICD-10-CM

## 2015-12-07 DIAGNOSIS — N200 Calculus of kidney: Secondary | ICD-10-CM

## 2015-12-07 DIAGNOSIS — I499 Cardiac arrhythmia, unspecified: Secondary | ICD-10-CM

## 2015-12-07 HISTORY — DX: Cardiac arrhythmia, unspecified: I49.9

## 2015-12-07 HISTORY — DX: Calculus of kidney: N20.0

## 2015-12-07 LAB — GLUCOSE, CAPILLARY
GLUCOSE-CAPILLARY: 132 mg/dL — AB (ref 65–99)
GLUCOSE-CAPILLARY: 117 mg/dL — AB (ref 65–99)
Glucose-Capillary: 126 mg/dL — ABNORMAL HIGH (ref 65–99)
Glucose-Capillary: 106 mg/dL — ABNORMAL HIGH (ref 65–99)

## 2015-12-07 LAB — CBC
HEMATOCRIT: 37.5 % (ref 35.0–47.0)
Hemoglobin: 12.9 g/dL (ref 12.0–16.0)
MCH: 31 pg (ref 26.0–34.0)
MCHC: 34.4 g/dL (ref 32.0–36.0)
MCV: 90.1 fL (ref 80.0–100.0)
PLATELETS: 125 10*3/uL — AB (ref 150–440)
RBC: 4.16 MIL/uL (ref 3.80–5.20)
RDW: 14.6 % — AB (ref 11.5–14.5)
WBC: 13.4 10*3/uL — AB (ref 3.6–11.0)

## 2015-12-07 LAB — BASIC METABOLIC PANEL
Anion gap: 8 (ref 5–15)
BUN: 35 mg/dL — AB (ref 6–20)
CALCIUM: 8.1 mg/dL — AB (ref 8.9–10.3)
CO2: 27 mmol/L (ref 22–32)
Chloride: 108 mmol/L (ref 101–111)
Creatinine, Ser: 1.01 mg/dL — ABNORMAL HIGH (ref 0.44–1.00)
GFR calc Af Amer: >60 mL/min (ref >60)
GFR, EST NON AFRICAN AMERICAN: 52 mL/min — AB (ref >60)
Glucose, Bld: 134 mg/dL — ABNORMAL HIGH (ref 65–99)
POTASSIUM: 3.2 mmol/L — AB (ref 3.5–5.1)
SODIUM: 143 mmol/L (ref 135–145)

## 2015-12-07 MED ORDER — POTASSIUM CHLORIDE CRYS ER 20 MEQ PO TBCR
40.0000 meq | EXTENDED_RELEASE_TABLET | Freq: Two times a day (BID) | ORAL | Status: AC
  Administered 2015-12-07 – 2015-12-08 (×3): 40 meq via ORAL
  Filled 2015-12-07 (×2): qty 2

## 2015-12-07 MED ORDER — METOPROLOL TARTRATE 25 MG PO TABS: 12.5000 mg | ORAL_TABLET | Freq: Two times a day (BID) | ORAL | Status: DC

## 2015-12-07 MED ORDER — AMIODARONE HCL IN DEXTROSE 360-4.14 MG/200ML-% IV SOLN
60.0000 mg/h | INTRAVENOUS | Status: AC
  Administered 2015-12-07: 60 mg/h via INTRAVENOUS
  Filled 2015-12-07 (×2): qty 200

## 2015-12-07 MED ORDER — AMIODARONE HCL IN DEXTROSE 360-4.14 MG/200ML-% IV SOLN
30.0000 mg/h | INTRAVENOUS | Status: DC
Start: 2015-12-07 — End: 2015-12-09
  Administered 2015-12-07 – 2015-12-09 (×4): 30 mg/h via INTRAVENOUS
  Filled 2015-12-07: qty 200

## 2015-12-07 MED ORDER — FUROSEMIDE 10 MG/ML IJ SOLN
20.0000 mg | Freq: Once | INTRAMUSCULAR | Status: AC
  Administered 2015-12-07: 20 mg via INTRAVENOUS
  Filled 2015-12-07: qty 2

## 2015-12-07 MED ORDER — POTASSIUM CHLORIDE CRYS ER 20 MEQ PO TBCR
40.0000 meq | EXTENDED_RELEASE_TABLET | Freq: Once | ORAL | Status: AC
  Administered 2015-12-07: 40 meq via ORAL
  Filled 2015-12-07: qty 2

## 2015-12-07 MED ORDER — TIMOLOL MALEATE 0.5 % OP SOLN
1.0000 [drp] | Freq: Two times a day (BID) | OPHTHALMIC | Status: DC
Start: 2015-12-07 — End: 2015-12-09
  Administered 2015-12-07 – 2015-12-08 (×3): 1 [drp] via OPHTHALMIC
  Filled 2015-12-07: qty 5

## 2015-12-07 MED ORDER — TAMSULOSIN HCL 0.4 MG PO CAPS
0.4000 mg | ORAL_CAPSULE | Freq: Every day | ORAL | Status: DC
  Administered 2015-12-07 – 2015-12-09 (×3): 0.4 mg via ORAL
  Filled 2015-12-07 (×3): qty 1

## 2015-12-07 MED ORDER — METOPROLOL TARTRATE 25 MG PO TABS
25.0000 mg | ORAL_TABLET | Freq: Four times a day (QID) | ORAL | Status: DC
  Administered 2015-12-07 – 2015-12-09 (×7): 25 mg via ORAL
  Filled 2015-12-07 (×8): qty 1

## 2015-12-07 MED ORDER — DOCUSATE SODIUM 100 MG PO CAPS
100.0000 mg | ORAL_CAPSULE | Freq: Two times a day (BID) | ORAL | Status: DC
  Administered 2015-12-07 – 2015-12-09 (×5): 100 mg via ORAL
  Filled 2015-12-07 (×5): qty 1

## 2015-12-07 MED ORDER — DORZOLAMIDE HCL 2 % OP SOLN
1.0000 [drp] | Freq: Two times a day (BID) | OPHTHALMIC | Status: DC
  Administered 2015-12-07 – 2015-12-09 (×4): 1 [drp] via OPHTHALMIC
  Filled 2015-12-07: qty 10

## 2015-12-07 NOTE — Progress Notes (Signed)
[facility] Progress Note Patient Name: Alyssa Duncan DOB: [birth year] MRN: [id number]   Date of Service  12/07/2015  HPI/Events of Note  Hypokalemia  eICU Interventions  Potassium replaced     Intervention Category Minor Interventions: Electrolytes abnormality - evaluation and management  DETERDING,ELIZABETH 12/07/2015, 6:38 AM

## 2015-12-07 NOTE — Progress Notes (Signed)
Patient converted to NSR. MD notified. Orders to continue amio through the night and reevaluate dosage and route tomorrow.    Lolita Patella, RN 12/07/15 [phone]

## 2015-12-07 NOTE — Progress Notes (Signed)
HR increasing.  PRN metoprolol administered and amiodarone increased to 60 and hour.  Dr Milly Jakob informed.

## 2015-12-07 NOTE — Progress Notes (Signed)
[facility] NAME: Alyssa Duncan    MR#:  [id number]  DATE OF BIRTH:  [birth year]  SUBJECTIVE:   Mental status much improved since yesterday. REmains in A. Fib w/ RVR.  Family at bedside.  No other acute events overnight. Having some urinary retention.   REVIEW OF SYSTEMS:    Review of Systems  Constitutional: Negative for chills and fever.  HENT: Negative for congestion and tinnitus.   Eyes: Negative for blurred vision and double vision.  Respiratory: Negative for cough, shortness of breath and wheezing.   Cardiovascular: Negative for chest pain, orthopnea and PND.  Gastrointestinal: Negative for abdominal pain, diarrhea, nausea and vomiting.  Genitourinary: Negative for dysuria and hematuria.  Neurological: Negative for dizziness, sensory change and focal weakness.  All other systems reviewed and are negative.   Nutrition: Heart healthy Tolerating Diet: Yes Tolerating PT: Await Eval.  DRUG ALLERGIES:   Allergies  Allergen Reactions  . Cardura [Doxazosin] Other (See Comments)    unsure  . Clonidine Derivatives Other (See Comments)    unsure  . Enalapril Other (See Comments)    unsure  . Levaquin [Levofloxacin] Other (See Comments)    Couldn't raise her arms  . Mobic [Meloxicam] Other (See Comments)    unsure  . Sulfa Antibiotics Nausea Only  . Vicodin [Hydrocodone-Acetaminophen] Other (See Comments)    unsure    VITALS:  Blood pressure 121/66, pulse 78, temperature 99.2 F (37.3 C), temperature source Axillary, resp. rate (!) 22, height 5\' 2"  (1.575 m), weight 101.3 kg (223 lb 6.4 oz), SpO2 95 %.  PHYSICAL EXAMINATION:   Physical Exam  GENERAL:  75 y.o.-year-old patient lying in bed in NAD.  EYES: Pupils equal, round, reactive to light and accommodation. No scleral icterus. Extraocular muscles intact.  HEENT: Head atraumatic, normocephalic. Oropharynx and nasopharynx clear.  NECK:  Supple, no jugular venous  distention. No thyroid enlargement, no tenderness.  LUNGS: Good air entry bilaterally, no rales, rhonchi, wheezes. Negative use of accessory muscles. CARDIOVASCULAR: S1, S2 Irregular. No murmurs, rubs, or gallops.  ABDOMEN: Soft, nontender, nondistended. Bowel sounds present. No organomegaly or mass.  EXTREMITIES: No cyanosis, clubbing or edema b/l.    NEUROLOGIC: Cranial nerves II through XII are intact. No focal Motor or sensory deficits b/l. PSYCHIATRIC: The patient is alert and oriented x 3.  SKIN: No obvious rash, lesion, or ulcer.    LABORATORY PANEL:   CBC  Recent Labs Lab 12/07/15 0436  WBC 13.4*  HGB 12.9  HCT 37.5  PLT 125*   ------------------------------------------------------------------------------------------------------------------  Chemistries   Recent Labs Lab 12/03/15 1837  12/06/15 0302 12/07/15 0436  NA 138  < > 142 143  K 3.7  < > 3.6 3.2*  CL 102  < > 112* 108  CO2 22  < > 24 27  GLUCOSE 128*  < > 153* 134*  BUN 33*  < > 60* 35*  CREATININE 2.39*  < > 1.48* 1.01*  CALCIUM 8.2*  < > 7.6* 8.1*  MG  --   --  1.9  --   AST 44*  --   --   --   ALT 31  --   --   --   ALKPHOS 102  --   --   --   BILITOT 0.7  --   --   --   < > = values in this interval not displayed. ------------------------------------------------------------------------------------------------------------------  Cardiac Enzymes  Recent Labs Lab 12/06/15  [id number]  TROPONINI 0.14*   ------------------------------------------------------------------------------------------------------------------  RADIOLOGY:  No results found.   ASSESSMENT AND PLAN:   75 year old female with past medical history of central sleep apnea, hypertension, hypothyroidism, hyperlipidemia, GERD, history of breast cancer who presented to the hospital directly admitted for fever, Right flank pain due to nephrolithiasis.   1. Sepsis-secondary to UTI with nephrolithiasis. -Patient's urine and blood  cultures are growing Escherichia coli. - afebrile, hemodynamically stable.  E. Coli is pansensitive and cont. Ancef.  Needs total 2 weeks of abx.    2. Acute delirium-etiology unclear but suspected to be secondary to underlying sepsis, sundowning. -Patient received some Ativan which may have made it somewhat worse. - much improved today.  Cont. PRN haldol and avoid Benzo's  3. Atrial fibrillation with rapid ventricular response-secondary to underlying sepsis -Continue amiodarone drip, will advance metoprolol dose. Continue IV metoprolol when necessary. Echo still pending. -If not improving consider cardioversion early next week. -Continue Lovenox and can switch to DOAC if no procedures planned.   4. Acute respiratory failure with hypoxia-patient developed respiratory distress postoperatively after right-sided ureteral stent placement.  -Suspected to be secondary to flash pulmonary edema and now much improved and off bipap.  Stable.   5. Acute renal failure-secondary to nephrolithiasis/sepsis/UTI. -Continue IV fluids, IV Ancef -Cr. Much improved and back to baseline.  Will follow BUN/Cr.   6. Nephrolithiasis-patient had distal right ureteral stone. Appreciate urology input and patient is status post right-sided ureteral stent placement POD #3.  - cont. Further care as per Urology. Will need stent removed as outpatient.  - foley removed yesterday and voiding well but still having some post-void residual and urinary retention. Will start some Flomax.   7. Hypothyroidism-continue Synthroid.  8. Glaucoma-continue dorzolamide/timolol eyedrops.  9. GERD-continue Protonix.  10. Hyperlipidemia-continue Pravachol.  Await PT eval.   All the records are reviewed and case discussed with Care Management/Social Worker. Management plans discussed with the patient, family and they are in agreement.  CODE STATUS: Full  DVT Prophylaxis: Lovenox  TOTAL Critical Care TIME TAKING CARE OF THIS  PATIENT: 30 minutes.   POSSIBLE D/C IN 2-3 DAYS, DEPENDING ON CLINICAL CONDITION.   Henreitta Leber M.D on 12/07/2015 at 2:09 PM  Between 7am to 6pm - Pager - [phone]  After 6pm go to [email or web address] - Proofreader  [facility]  Office  [phone]  CC: Primary care physician; Adin Hector, MD

## 2015-12-07 NOTE — Progress Notes (Signed)
Delirium has resolved Cardiology is managing AFRVR Her exam suggests mild pulmonary edema - Lasix has already been ordered  As her major issues are addressed by other care providers, PCCM will sign off. Please call if we can be of further assistance  I think she should remain in the SDU through today as she has already "bounced back" once and remains a little tenuous  Merton Border, MD PCCM service Mobile [phone] Pager [phone] 12/07/2015

## 2015-12-07 NOTE — Progress Notes (Signed)
Patient Name: Alyssa Duncan Date of Encounter: 12/07/2015  Hospital Problem List     Principal Problem:   Sepsis [facility]) Active Problems:   Bacteremia   Acute respiratory distress ([place])   New onset atrial fibrillation ([facility])   Ureteral stone   Hypokalemia   Acute delirium   UTI (urinary tract infection)   AKI (acute kidney injury) [facility])    Patient Summary     75 y/o ? w/o prior cardiac hx who presented to [facility] on 8/28 in the setting of abd pain, n, and v, and was found to have a 60mm ureteral stone, UTI/Urosepsis/E coli bacteremia, and resp failure req BiPAP.  She underwent ureteral stenting on 8/29 and developed AF RVR on the evening of 8/30.  She was initially treated with IV Dilt but this was switched to IV amio on 8/31 in the setting of inadequate rate control and relative hypotension.  CHA2DS2VASc = 4 - currently on enox.  Subjective   Pt remains in AF w/ rates in the 120's to 130's.  Denies palpitations.  C/o mild orthopnea - can't quite get comfortable lying flat.  No chest pain.  Eager to get up and out of bed.  Inpatient Medications    .  ceFAZolin (ANCEF) IV  1 g Intravenous Q8H  . chlorhexidine  15 mL Mouth Rinse BID  . dorzolamide-timolol  1 drop Both Eyes BID  . enoxaparin (LOVENOX) injection  1 mg/kg Subcutaneous Q12H  . furosemide  20 mg Intravenous Once  . insulin aspart  0-5 Units Subcutaneous QHS  . insulin aspart  0-9 Units Subcutaneous TID WC  . levothyroxine  75 mcg Oral QAC breakfast  . mouth rinse  15 mL Mouth Rinse q12n4p  . metoprolol tartrate  12.5 mg Oral BID  . multivitamin with minerals  1 tablet Oral Daily  . pantoprazole  40 mg Oral Daily  . potassium chloride  40 mEq Oral Once  . pravastatin  40 mg Oral q1800    Vital Signs    Vitals:   12/07/15 0430 12/07/15 0500 12/07/15 0530 12/07/15 0600  BP: 132/85 126/89 104/71 121/66  Pulse: (!) 136 (!) 134 (!) 42 78  Resp: 18 17 17  (!) 22  Temp:      TempSrc:      SpO2: 90% 92% 91% 95%    Weight:      Height:        Intake/Output Summary (Last 24 hours) at 12/07/15 0850 Last data filed at 12/06/15 1800  Gross per 24 hour  Intake          1101.96 ml  Output                0 ml  Net          1101.96 ml   Filed Weights   12/03/15 1703 12/04/15 1500  Weight: 208 lb 9.6 oz (94.6 kg) 223 lb 6.4 oz (101.3 kg)    Physical Exam    GEN: Well nourished, well developed, in no acute distress.  HEENT: normal.  Neck: Supple, obese, difficult to gauge jvp.  No carotid bruits, or masses. Cardiac: IR, IR, tachy, no murmurs, rubs, or gallops. No clubbing, cyanosis, edema.  Radials/DP/PT 2+ and equal bilaterally.  Respiratory:  Respirations regular and unlabored, diminished breath sounds bilat bases, R more so than L, w/ bibasilar crackles. GI: Soft, nontender, nondistended, BS + x 4. MS: no deformity or atrophy. Skin: warm and dry, no rash. Neuro:  Strength  and sensation are intact. Psych: Normal affect.  Labs    CBC  Recent Labs  12/06/15 0302 12/07/15 0436  WBC 20.4* 13.4*  HGB 12.3 12.9  HCT 36.3 37.5  MCV 90.7 90.1  PLT 108* 0000000*   Basic Metabolic Panel  Recent Labs  12/06/15 0302 12/07/15 0436  NA 142 143  K 3.6 3.2*  CL 112* 108  CO2 24 27  GLUCOSE 153* 134*  BUN 60* 35*  CREATININE 1.48* 1.01*  CALCIUM 7.6* 8.1*  MG 1.9  --    Cardiac Enzymes  Recent Labs  12/06/15 0621  TROPONINI 0.14*   Thyroid Function Tests  Recent Labs  12/06/15 0621  TSH 2.910    Telemetry    AFib 120's to 130's, brief run of NSVT.  Radiology    Dg Chest 1 View  Result Date: 12/04/2015 CLINICAL DATA:  Shortness of breath. EXAM: CHEST 1 VIEW COMPARISON:  None. FINDINGS: Cardiomegaly with vascular congestion. Low lung volumes with bibasilar atelectasis. No overt edema or effusions. No acute bony abnormality. IMPRESSION: Low lung volumes with bibasilar atelectasis. Cardiomegaly, vascular congestion. Electronically Signed   By: Rolm Baptise M.D.   On:  12/04/2015 12:45   Ct Abdomen Pelvis W Contrast  Result Date: 12/03/2015 CLINICAL DATA:  RIGHT lower quadrant pain worsening for 2 days, vomiting. Assess for bowel obstruction or appendicitis. History of breast cancer, diabetes, and hip repair. EXAM: CT ABDOMEN AND PELVIS WITH CONTRAST TECHNIQUE: Multidetector CT imaging of the abdomen and pelvis was performed using the standard protocol following bolus administration of intravenous contrast. CONTRAST:  36mL ISOVUE-300 IOPAMIDOL (ISOVUE-300) INJECTION 61% COMPARISON:  None. FINDINGS: LUNG BASES: Included view of the lung bases are clear. Heart is mildly enlarged. No pericardial effusions. Mild coronary artery calcifications. SOLID ORGANS: The liver, spleen, pancreas and adrenal glands are unremarkable. Numerous gallstones measure up to 12 mm without CT findings of acute cholecystitis. GASTROINTESTINAL TRACT: The stomach, small and large bowel are normal in course and caliber without inflammatory changes. Mild amount of retained large bowel stool. The appendix is not discretely identified, however there are no inflammatory changes in the right lower quadrant. KIDNEYS/ URINARY TRACT: Kidneys are orthotopic, RIGHT delayed nephrogram. 3 mm RIGHT lower pole nephrolithiasis. Moderate RIGHT hydroureteronephrosis the level of the distal ureter RIGHT 5 mm calculus is present. The RIGHT ureter is decompressed distal to the stone. No LEFT nephrolithiasis or hydronephrosis. 10 mm mildly exophytic LEFT lower pole cyst. No renal masses. Prompt excretion into the proximal LEFT ureteral collecting system. Urinary bladder is decompressed. PERITONEUM/RETROPERITONEUM: Aortoiliac vessels are normal in course and caliber. No lymphadenopathy by CT size criteria. Internal reproductive organs are unremarkable. No intraperitoneal free fluid nor free air. SOFT TISSUE/OSSEOUS STRUCTURES: Non-suspicious. Status post LEFT hip total arthroplasty. LEFT breast scarring. Grade 1 L4-5  anterolisthesis without spondylolysis. L3-4 through L5-S1 moderate to severe degenerative discs and facet arthropathy. Mild canal stenosis L4-5. Severe L3-4 through L5-S1 neural foraminal narrowing. Small fat containing umbilical hernia. IMPRESSION: 1. 5 mm distal RIGHT ureteral calculus resulting in moderate obstructive uropathy and mild RIGHT renal dysfunction. Residual 3 mm RIGHT nephrolithiasis. 2. Cholelithiasis without CT findings of acute cholecystitis. Electronically Signed   By: Elon Alas M.D.   On: 12/03/2015 03:07    Assessment & Plan    1.  Urosepsis/Bacteremia/Nephrolithiasis:  S/P ureteral stenting on 8/29.  Low grade fever last night - pending this AM.  WBC trending down.  Abx per IM/CCM.  2.  Acute respiratory failure:  Req BiPAP  on admission.  Has had some orthopnea and has diminished breath sounds with bibasilar crackles on exam. If accurate, I & O is + 1798 this admission.  She has not been weighed in 3 days.  Suspect some degree of diast failure in setting of rapid AF.  Echo pending.  D/C IVF.  Will give lasix 20 IV this am.   3.  AFib RVR:  Developed AF on evening of 8/30.  Initially on dilt but switched to amio on 8/31 in setting of inadequate rate control and relative hypotension.  She remains in AF this AM, rates 120's to 130's - asymptomatic.  Pressures variable - low 100's to 120's/130's.  She has been receiving lopressor 2.5 - 5 mg q3h prn.  Rates still not well controlled.  Tolerating PO's.  Will add lopressor 12.5 bid.  CHA2DS2VASc = 4-5 (? CHF).  Currently receiving lovenox.  If echo ok, will likely switch to eliquis 5 bid.  Hopefully she will convert as she improves clinically.  If not, will need to consider DCCV.  4. Elevated troponin:  Likely demand ischemia in the setting of sepsis/resp failure/rapid AFib.  No chest pain.  F/u echo.  Pt and family now say that she has been having significant DOE @ home when walking up stairs from basement.  This has progressed  over the past year.  Provided that echo is nl, we can arrange for outpt myoview once she fully recovers from acute infxn.   5.  Hypokalemia:  Supplemented earlier.  Signed, Murray Hodgkins NP

## 2015-12-08 ENCOUNTER — Inpatient Hospital Stay (HOSPITAL_COMMUNITY)
Admission: AD | Admit: 2015-12-08 | Discharge: 2015-12-08 | Disposition: A | Discharge status: Home / Self Care | Payer: Medicare Other | Source: Ambulatory Visit | Attending: Physician Assistant | Admitting: Physician Assistant

## 2015-12-08 DIAGNOSIS — I4891 Unspecified atrial fibrillation: Secondary | ICD-10-CM

## 2015-12-08 LAB — CBC
HCT: 37.8 % (ref 35.0–47.0)
HEMOGLOBIN: 13.2 g/dL (ref 12.0–16.0)
MCH: 30.9 pg (ref 26.0–34.0)
MCHC: 35 g/dL (ref 32.0–36.0)
MCV: 88.3 fL (ref 80.0–100.0)
PLATELETS: 128 10*3/uL — AB (ref 150–440)
RBC: 4.28 MIL/uL (ref 3.80–5.20)
RDW: 14.2 % (ref 11.5–14.5)
WBC: 14.1 10*3/uL — AB (ref 3.6–11.0)

## 2015-12-08 LAB — BASIC METABOLIC PANEL
ANION GAP: 8 (ref 5–15)
BUN: 25 mg/dL — ABNORMAL HIGH (ref 6–20)
CHLORIDE: 103 mmol/L (ref 101–111)
CO2: 30 mmol/L (ref 22–32)
Calcium: 8.2 mg/dL — ABNORMAL LOW (ref 8.9–10.3)
Creatinine, Ser: 1.02 mg/dL — ABNORMAL HIGH (ref 0.44–1.00)
GFR calc Af Amer: 60 mL/min — ABNORMAL LOW (ref >60)
GFR, EST NON AFRICAN AMERICAN: 52 mL/min — AB (ref >60)
Glucose, Bld: 111 mg/dL — ABNORMAL HIGH (ref 65–99)
POTASSIUM: 3.5 mmol/L (ref 3.5–5.1)
SODIUM: 141 mmol/L (ref 135–145)

## 2015-12-08 LAB — GLUCOSE, CAPILLARY
GLUCOSE-CAPILLARY: 145 mg/dL — AB (ref 65–99)
GLUCOSE-CAPILLARY: 119 mg/dL — AB (ref 65–99)
GLUCOSE-CAPILLARY: 158 mg/dL — AB (ref 65–99)
GLUCOSE-CAPILLARY: 159 mg/dL — AB (ref 65–99)

## 2015-12-08 LAB — ECHOCARDIOGRAM COMPLETE
Height: 62 in
Weight: 3574.4 oz

## 2015-12-08 MED ORDER — CALCIUM CARBONATE ANTACID 500 MG PO CHEW
1.0000 | CHEWABLE_TABLET | Freq: Three times a day (TID) | ORAL | Status: DC | PRN
  Administered 2015-12-08 (×2): 200 mg via ORAL
  Filled 2015-12-08 (×2): qty 1

## 2015-12-08 MED ORDER — POLYETHYLENE GLYCOL 3350 17 G PO PACK
17.0000 g | PACK | Freq: Every day | ORAL | Status: DC | PRN
  Administered 2015-12-08: 17 g via ORAL
  Filled 2015-12-08: qty 1

## 2015-12-08 NOTE — Progress Notes (Signed)
[facility] [place] NAME: Alyssa Duncan    MR#:  [id number]  DATE OF BIRTH:  1940/04/26  SUBJECTIVE:   Converted back to NSR this a.m.  Mental status much improved and back to baseline. Family at bedside.  No complaints presently.   REVIEW OF SYSTEMS:    Review of Systems  Constitutional: Negative for chills and fever.  HENT: Negative for congestion and tinnitus.   Eyes: Negative for blurred vision and double vision.  Respiratory: Negative for cough, shortness of breath and wheezing.   Cardiovascular: Negative for chest pain, orthopnea and PND.  Gastrointestinal: Negative for abdominal pain, diarrhea, nausea and vomiting.  Genitourinary: Negative for dysuria and hematuria.  Neurological: Negative for dizziness, sensory change and focal weakness.  All other systems reviewed and are negative.   Nutrition: Heart healthy Tolerating Diet: Yes Tolerating PT: Await Eval.  DRUG ALLERGIES:   Allergies  Allergen Reactions  . Cardura [Doxazosin] Other (See Comments)    unsure  . Clonidine Derivatives Other (See Comments)    unsure  . Enalapril Other (See Comments)    unsure  . Levaquin [Levofloxacin] Other (See Comments)    Couldn't raise her arms  . Mobic [Meloxicam] Other (See Comments)    unsure  . Sulfa Antibiotics Nausea Only  . Vicodin [Hydrocodone-Acetaminophen] Other (See Comments)    unsure    VITALS:  Blood pressure (!) 149/62, pulse 81, temperature 98 F (36.7 C), temperature source Oral, resp. rate 18, height 5\' 2"  (1.575 m), weight 94.8 kg (209 lb), SpO2 96 %.  PHYSICAL EXAMINATION:   Physical Exam  GENERAL:  75 y.o.-year-old patient sitting up in chair in NAD.   EYES: Pupils equal, round, reactive to light and accommodation. No scleral icterus. Extraocular muscles intact.  HEENT: Head atraumatic, normocephalic. Oropharynx and nasopharynx clear.  NECK:  Supple, no jugular venous distention. No thyroid enlargement,  no tenderness.  LUNGS: Good air entry bilaterally, no rales, rhonchi, wheezes. Negative use of accessory muscles. CARDIOVASCULAR: S1, S2 RRR. No murmurs, rubs, or gallops.  ABDOMEN: Soft, nontender, nondistended. Bowel sounds present. No organomegaly or mass.  EXTREMITIES: No cyanosis, clubbing or edema b/l.    NEUROLOGIC: Cranial nerves II through XII are intact. No focal Motor or sensory deficits b/l. PSYCHIATRIC: The patient is alert and oriented x 3.  SKIN: No obvious rash, lesion, or ulcer.    LABORATORY PANEL:   CBC  Recent Labs Lab 12/08/15 0557  WBC 14.1*  HGB 13.2  HCT 37.8  PLT 128*   ------------------------------------------------------------------------------------------------------------------  Chemistries   Recent Labs Lab 12/03/15 1837  12/06/15 0302  12/08/15 0557  NA 138  < > 142  < > 141  K 3.7  < > 3.6  < > 3.5  CL 102  < > 112*  < > 103  CO2 22  < > 24  < > 30  GLUCOSE 128*  < > 153*  < > 111*  BUN 33*  < > 60*  < > 25*  CREATININE 2.39*  < > 1.48*  < > 1.02*  CALCIUM 8.2*  < > 7.6*  < > 8.2*  MG  --   --  1.9  --   --   AST 44*  --   --   --   --   ALT 31  --   --   --   --   ALKPHOS 102  --   --   --   --  BILITOT 0.7  --   --   --   --   < > = values in this interval not displayed. ------------------------------------------------------------------------------------------------------------------  Cardiac Enzymes  Recent Labs Lab 12/06/15 0621  TROPONINI 0.14*   ------------------------------------------------------------------------------------------------------------------  RADIOLOGY:  No results found.   ASSESSMENT AND PLAN:   75 year old female with past medical history of central sleep apnea, hypertension, hypothyroidism, hyperlipidemia, GERD, history of breast cancer who presented to the hospital directly admitted for fever, Right flank pain due to nephrolithiasis.   1. Sepsis-secondary to UTI with  nephrolithiasis. -Patient's urine and blood cultures are growing Escherichia coli. - afebrile, hemodynamically stable.  E. Coli is pansensitive and cont. Ancef.  Needs total 2 weeks of abx and will switch to Oral upon discharge.   2. Acute delirium-etiology unclear but suspected to be secondary to underlying sepsis, sundowning. -Much improved and back to baseline. -  Cont. PRN haldol and avoid Benzo's  3. Atrial fibrillation with rapid ventricular response-secondary to underlying sepsis -Converted back to normal sinus rhythm. Continue amiodarone drip and we'll DC tomorrow and switch to oral amiodarone. -Continue metoprolol. Echo showing normal EF of 50-55% -Continue Lovenox and can switch to DOAC if no procedures planned.   4. Acute respiratory failure with hypoxia-patient developed respiratory distress postoperatively after right-sided ureteral stent placement.  -Suspected to be secondary to flash pulmonary edema and now much improved and off bipap.  Stable.   5. Acute renal failure-secondary to nephrolithiasis/sepsis/UTI. -Continue IV fluids, IV Ancef -Cr. Much improved and back to baseline.    6. Nephrolithiasis-patient had distal right ureteral stone. Appreciate urology input and patient is status post right-sided ureteral stent placement POD #3.  - cont. Further care as per Urology. Will need stent removed as outpatient.  - foley removed and voiding well. Cont. Flomax. No urinary retention.   7. Hypothyroidism-continue Synthroid.  8. Glaucoma-continue dorzolamide/timolol eyedrops.  9. GERD-continue Protonix.  10. Hyperlipidemia-continue Pravachol.  Await PT eval.   All the records are reviewed and case discussed with Care Management/Social Worker. Management plans discussed with the patient, family and they are in agreement.  CODE STATUS: Full  DVT Prophylaxis: Lovenox  TOTAL Critical Care TIME TAKING CARE OF THIS PATIENT: 30 minutes.   POSSIBLE D/C IN 1-2 DAYS,  DEPENDING ON CLINICAL CONDITION.   Henreitta Leber M.D on 12/08/2015 at 2:06 PM  Between 7am to 6pm - Pager - [phone]  After 6pm go to [email or web address] - Proofreader  [facility]  Office  [phone]  CC: Primary care physician; Adin Hector, MD

## 2015-12-08 NOTE — Progress Notes (Signed)
Patient transferred from ICU. Vitals are stable. PT remains NSR on tele. Amiodarone drip continued at 30mg /hr. No complaints of pain. Patient's only complaint is feeling very bloated, MD text paged for laxative per patient's request. Family is at bedside. Will continue to monitor.

## 2015-12-08 NOTE — Progress Notes (Signed)
Patient Name: Alyssa Duncan      SUBJECTIVE: 75 year old lady admitted urosepsis (positive blood cultures E[place]i) and respiratory failure requiring BiPAP. She underwent ureteral stenting. Post procedurally she developed atrial fibrillation with a rapid rate initially treated with diltiazem subsequently with amiodarone. Echocardiogram ordered but not completely; TSH normal Has reverted to sinus Feels much better than yday   Past Medical History:  Diagnosis Date  . Arthritis   . Asthma   . Back pain   . Breast cancer ([place]) 2013   left breast ductal and lobular carcinoma  . GERD (gastroesophageal reflux disease)   . Hyperlipidemia   . Hypertension   . Hypothyroidism   . Shingles   . Skin cancer   . Sleep apnea     Scheduled Meds:  Scheduled Meds: .  ceFAZolin (ANCEF) IV  1 g Intravenous Q8H  . chlorhexidine  15 mL Mouth Rinse BID  . docusate sodium  100 mg Oral BID  . dorzolamide  1 drop Both Eyes BID   And  . timolol  1 drop Both Eyes BID  . enoxaparin (LOVENOX) injection  1 mg/kg Subcutaneous Q12H  . insulin aspart  0-5 Units Subcutaneous QHS  . insulin aspart  0-9 Units Subcutaneous TID WC  . levothyroxine  75 mcg Oral QAC breakfast  . mouth rinse  15 mL Mouth Rinse q12n4p  . metoprolol tartrate  25 mg Oral Q6H  . multivitamin with minerals  1 tablet Oral Daily  . pantoprazole  40 mg Oral Daily  . potassium chloride  40 mEq Oral BID  . pravastatin  40 mg Oral q1800  . tamsulosin  0.4 mg Oral Daily   Continuous Infusions: . amiodarone 60 mg/hr (12/07/15 0406)  . amiodarone 30 mg/hr (12/08/15 0600)   acetaminophen, metoprolol, ondansetron (ZOFRAN) IV, oxyCODONE-acetaminophen    PHYSICAL EXAM Vitals:   12/08/15 0500 12/08/15 0556 12/08/15 0600 12/08/15 1000  BP: 122/76 (!) 136/55 133/81 140/63  Pulse: 87 87 87 81  Resp: (!) 22  (!) 21 (!) 25  Temp:      TempSrc:      SpO2: 90%  95% 97%  Weight:      Height:      Well developed and nourished  in no acute distress HENT normal Neck supple   Clear laterally  Regular rate and rhythm, no murmurs or gallops Abd-soft   No Clubbing cyanosis edema Skin-warm and dry A & Oriented  Grossly normal sensory and motor function   TELEMETRY: Reviewed telemetry pt in *sinus     Intake/Output Summary (Last 24 hours) at 12/08/15 1029 Last data filed at 12/08/15 0600  Gross per 24 hour  Intake          1850.83 ml  Output             2950 ml  Net         -1099.17 ml    LABS: Basic Metabolic Panel:  Recent Labs Lab 12/03/15 1837 12/04/15 0457 12/04/15 1518 12/05/15 0633 12/06/15 0302 12/07/15 0436 12/08/15 0557  NA 138 139 139 141 142 143 141  K 3.7 4.5 3.5 3.4* 3.6 3.2* 3.5  CL 102 100* 104 106 112* 108 103  CO2 22 26 22 23 24 27 30   GLUCOSE 128* 149* 116* 162* 153* 134* 111*  BUN 33* 46* 50* 63* 60* 35* 25*  CREATININE 2.39* 3.32* 3.09* 2.60* 1.48* 1.01* 1.02*  CALCIUM 8.2* 7.9* 7.3* 7.6* 7.6* 8.1* 8.2*  MG  --   --   --   --  1.9  --   --    Cardiac Enzymes:  Recent Labs  12/06/15 0621  TROPONINI 0.14*   CBC:  Recent Labs Lab 12/03/15 0153 12/03/15 1837 12/04/15 0457 12/05/15 [id number] 12/06/15 0302 12/07/15 0436 12/08/15 0557  WBC 13.3* 11.0 22.0* 18.2* 20.4* 13.4* 14.1*  HGB 15.5 13.4 12.5 12.9 12.3 12.9 13.2  HCT 45.1 39.4 37.0 37.9 36.3 37.5 37.8  MCV 89.8 91.8 91.8 91.6 90.7 90.1 88.3  PLT 285 191 147* 114* 108* 125* 128*    ASSESSMENT AND PLAN:  Principal Problem:   Sepsis ([place]) Active Problems:     AKI (acute kidney injury) ([place])     New onset atrial fibrillation ([place])   Reverted to sinus Would keep amio on for about two weeks and can change to PO prob tomorrow Hopefully Afib, which for her was unassociated with palps, is isolated occurring in the context of her sepsis, and such will not require long term therapy, eiter amio or anticoagulation-- reasonable to continue LMWH for now   Signed, Virl Axe MD  12/08/2015

## 2015-12-09 LAB — GLUCOSE, CAPILLARY
GLUCOSE-CAPILLARY: 120 mg/dL — AB (ref 65–99)
Glucose-Capillary: 109 mg/dL — ABNORMAL HIGH (ref 65–99)

## 2015-12-09 LAB — BASIC METABOLIC PANEL
Anion gap: 7 (ref 5–15)
BUN: 21 mg/dL — AB (ref 6–20)
CALCIUM: 8.2 mg/dL — AB (ref 8.9–10.3)
CO2: 28 mmol/L (ref 22–32)
CREATININE: 0.96 mg/dL (ref 0.44–1.00)
Chloride: 104 mmol/L (ref 101–111)
GFR calc Af Amer: >60 mL/min (ref >60)
GFR, EST NON AFRICAN AMERICAN: 56 mL/min — AB (ref >60)
Glucose, Bld: 118 mg/dL — ABNORMAL HIGH (ref 65–99)
Potassium: 3.6 mmol/L (ref 3.5–5.1)
SODIUM: 139 mmol/L (ref 135–145)

## 2015-12-09 MED ORDER — AMIODARONE HCL 200 MG PO TABS: 200.0000 mg | ORAL_TABLET | Freq: Every day | ORAL | 1 refills | Status: DC

## 2015-12-09 MED ORDER — APIXABAN 5 MG PO TABS: 5.0000 mg | ORAL_TABLET | Freq: Two times a day (BID) | ORAL | 1 refills | Status: DC

## 2015-12-09 MED ORDER — AMIODARONE HCL 400 MG PO TABS: 400.0000 mg | ORAL_TABLET | Freq: Two times a day (BID) | ORAL | 0 refills | Status: DC

## 2015-12-09 MED ORDER — AMIODARONE HCL 200 MG PO TABS
400.0000 mg | ORAL_TABLET | Freq: Two times a day (BID) | ORAL | Status: DC
  Administered 2015-12-09: 400 mg via ORAL
  Filled 2015-12-09: qty 2

## 2015-12-09 MED ORDER — TAMSULOSIN HCL 0.4 MG PO CAPS: 0.4000 mg | ORAL_CAPSULE | Freq: Every day | ORAL | 1 refills | Status: DC

## 2015-12-09 MED ORDER — CEPHALEXIN 500 MG PO CAPS: 500.0000 mg | ORAL_CAPSULE | Freq: Three times a day (TID) | ORAL | 0 refills | Status: AC

## 2015-12-09 NOTE — Discharge Instructions (Signed)
Sepsis, Adult Sepsis is a serious infection of your blood or tissues that affects your whole body. The infection that causes sepsis may be bacterial, viral, fungal, or parasitic. Sepsis may be life threatening. Sepsis can cause your blood pressure to drop. This may result in shock. Shock causes your central nervous system and your organs to stop working correctly.  RISK FACTORS Sepsis can happen in anyone, but it is more likely to happen in people who have weakened immune systems. SIGNS AND SYMPTOMS  Symptoms of sepsis can include:  Fever or low body temperature (hypothermia).  Rapid breathing (hyperventilation).  Chills.  Rapid heartbeat (tachycardia).  Confusion or light-headedness.  Trouble breathing.  Urinating much less than usual.  Cool, clammy skin or red, flushed skin.  Other problems with the heart, kidneys, or brain. DIAGNOSIS  Your health care provider will likely do tests to look for an infection, to see if the infection has spread to your blood, and to see how serious your condition is. Tests can include:  Blood tests, including cultures of your blood.  Cultures of other fluids from your body, such as:  Urine.  Pus from wounds.  Mucus coughed up from your lungs.  Urine tests other than cultures.  X-ray exams or other imaging tests. TREATMENT  Treatment will begin with elimination of the source of infection. If your sepsis is likely caused by a bacterial or fungal infection, you will be given antibiotic or antifungal medicines. You may also receive:  Oxygen.  Fluids through an IV tube.  Medicines to increase your blood pressure.  A machine to clean your blood (dialysis) if your kidneys fail.  A machine to help you breathe if your lungs fail. SEEK IMMEDIATE MEDICAL CARE IF: You get an infection or develop any of the signs and symptoms of sepsis after surgery or a hospitalization.   This information is not intended to replace advice given to you by  your health care provider. Make sure you discuss any questions you have with your health care provider.   Document Released: 12/21/2002 Document Revised: 08/08/2014 Document Reviewed: 11/29/2012 Elsevier Interactive Patient Education 2016 [employer]  

## 2015-12-09 NOTE — Evaluation (Signed)
Physical Therapy Evaluation Patient Details Name: Alyssa Duncan MRN: [id number] DOB: Jun 14, 1940 Today's Date: 12/09/2015   History of Present Illness  75 y/o female here with sepsis, UTI.  Clinical Impression  Pt eager to work with PT and go home. She did well with all requested acts and showed functional strength and mobility.  She was able to walk ~200 ft w/o AD and negotiated up/down 6 steps with light UE use.  Pt has some fatigue with the effort, but even on room air her sats remained in the 90s the entire time.    Follow Up Recommendations No PT follow up    Equipment Recommendations       Recommendations for Other Services       Precautions / Restrictions Precautions Precautions: Fall Restrictions Weight Bearing Restrictions: No      Mobility  Bed Mobility               General bed mobility comments: Pt in recliner, not tested  Transfers Overall transfer level: Independent Equipment used: None             General transfer comment: Pt able to get to standing w/ good confidence, safety   Ambulation/Gait Ambulation/Gait assistance: Modified independent (Device/Increase time) Ambulation Distance (Feet): 200 Feet Assistive device: None       General Gait Details: Pt did have short, shuffling steps that are apparently her baseline.  She did not have any LOBs or other safety issues.  She did need a few brief standing rest breaks but ultimately did well.  Stairs            Wheelchair Mobility    Modified Rankin (Stroke Patients Only)       Balance Overall balance assessment: Independent                                           Pertinent Vitals/Pain Pain Assessment: No/denies pain    Home Living Family/patient expects to be discharged to:: Private residence Living Arrangements: Spouse/significant other Available Help at Discharge: Family   Home Access: Stairs to enter Entrance Stairs-Rails:  (also has an enterance  with ~10 steps and rail) Entrance Stairs-Number of Steps: 1          Prior Function Level of Independence: Independent         Comments: reports she is able to do all she needs w/o issue     Hand Dominance        Extremity/Trunk Assessment   Upper Extremity Assessment: Overall WFL for tasks assessed           Lower Extremity Assessment: Overall WFL for tasks assessed         Communication   Communication: No difficulties  Cognition Arousal/Alertness: Awake/alert Behavior During Therapy: WFL for tasks assessed/performed Overall Cognitive Status: Within Functional Limits for tasks assessed                      General Comments      Exercises        Assessment/Plan    PT Assessment Patent does not need any further PT services  PT Diagnosis Generalized weakness   PT Problem List    PT Treatment Interventions     PT Goals (Current goals can be found in the Care Plan section) Acute Rehab PT Goals Patient Stated Goal: go home  Frequency     Barriers to discharge        Co-evaluation               End of Session Equipment Utilized During Treatment: Gait belt Activity Tolerance: Patient tolerated treatment well (Pt's O2 remained in the 90s t/o the effort on room air) Patient left: in chair;with call bell/phone within reach;with family/visitor present Nurse Communication: Mobility status         Time: 1325-1345 PT Time Calculation (min) (ACUTE ONLY): 20 min   Charges:   PT Evaluation $PT Eval Low Complexity: 1 Procedure     PT G CodesKreg Duncan, DPT 12/09/2015, 3:17 PM

## 2015-12-09 NOTE — Progress Notes (Signed)
Patient seen by PT at this time. Informed by PT that patient has no needs at this time. Will resume d/c order by attending physician as previously stated. Wenda Low [facility]

## 2015-12-09 NOTE — Progress Notes (Signed)
Spoke to Dr. Verdell Carmine regarding stopping Amiodarone drip following administration of PO version. Physician did not put in the order to d/c drip. Order has been entered at this time. PO version already administered. Wenda Low Ilah Boule.

## 2015-12-09 NOTE — Discharge Summary (Signed)
[facility] NAME: Alyssa Duncan    MR#:  [id number]  DATE OF BIRTH:  May 28, 1940  DATE OF ADMISSION:  12/03/2015 ADMITTING PHYSICIAN: Vaughan Basta, MD  DATE OF DISCHARGE: 12/09/2015  PRIMARY CARE PHYSICIAN: Tama High III, MD    ADMISSION DIAGNOSIS:  Nausea vomiting hypotension rt renal stone Kidney Stone n/a  DISCHARGE DIAGNOSIS:  Principal Problem:   Sepsis ([place]) Active Problems:   Ureteral stone   AKI (acute kidney injury) ([place])   Hypokalemia   Bacteremia   Acute respiratory distress ([place])   New onset atrial fibrillation ([facility])   Acute delirium   UTI (urinary tract infection)   SECONDARY DIAGNOSIS:   Past Medical History:  Diagnosis Date  . Arthritis   . Asthma   . Back pain   . Breast cancer ([place]) 2013   left breast ductal and lobular carcinoma  . GERD (gastroesophageal reflux disease)   . Hyperlipidemia   . Hypertension   . Hypothyroidism   . Shingles   . Skin cancer   . Sleep apnea     HOSPITAL COURSE:   75 year old female with past medical history of central sleep apnea, hypertension, hypothyroidism, hyperlipidemia, GERD, history of breast cancer who presented to the hospital directly admitted for fever, Right flank pain due to nephrolithiasis.   1. Sepsis- this was secondary to UTI with nephrolithiasis. -Patient's urine and blood cultures grew Escherichia coli. -Initially patient was treated with IV meropenem but once sensitivities came back she was switched over to IV Ancef. -She is now being discharged on oral Keflex. She'll be treated for a total of 2 weeks since her positive blood cultures. Clinically stable for discharge.  2. Acute delirium-this was secondary to sepsis with underlying sundowning. -She received some as needed Haldol, benzodiazepines were held. This is much improved and her mental status is now back to baseline.  3. Atrial fibrillation with rapid ventricular  response-secondary to underlying sepsis -She has converted back to normal sinus rhythm. She was placed on a amiodarone drip and now has been weaned off is currently being discharged on oral amiodarone.  -She will continue some oral metoprolol for rate control. She was seen by cardiology and they recommended anticoagulation with Eliquis which she is being discharged home. - her Echo showing normal EF of 50-55%.  Follow-up with cardiology in the next 2 weeks.  4. Acute respiratory failure with hypoxia-patient developed respiratory distress postoperatively after right-sided ureteral stent placement.  -This was secondary to flash pulmonary edema improved with BiPAP and some Lasix and is currently resolved.  5. Acute renal failure-secondary to nephrolithiasis/sepsis/UTI. - resolved w/ IV fluids, abx.  Back to baseline now.   6. Nephrolithiasis-patient had distal right ureteral stone. Appreciate urology input and patient is status post right-sided ureteral stent placement POD #4.  - cont. Further care as per Urology. Will need stent removed as outpatient.  - foley has been removing and she is voiding well. She will cont. Flomax as outpatient.   7. Hypothyroidism- she will continue Synthroid.  8. Glaucoma- she will continue dorzolamide/timolol eyedrops.  9. GERD- she will continue Protonix.  10. Hyperlipidemia- she will continue Pravachol.  DISCHARGE CONDITIONS:   Stable  CONSULTS OBTAINED:  Treatment Team:  Hollice Espy, MD Wellington Hampshire, MD  DRUG ALLERGIES:   Allergies  Allergen Reactions  . Cardura [Doxazosin] Other (See Comments)    unsure  . Clonidine Derivatives Other (See Comments)    unsure  .  Enalapril Other (See Comments)    unsure  . Levaquin [Levofloxacin] Other (See Comments)    Couldn't raise her arms  . Mobic [Meloxicam] Other (See Comments)    unsure  . Sulfa Antibiotics Nausea Only  . Vicodin [Hydrocodone-Acetaminophen] Other (See Comments)     unsure    DISCHARGE MEDICATIONS:     Medication List    STOP taking these medications   amLODipine 10 MG tablet Commonly known as:  NORVASC   oxyCODONE-acetaminophen 5-325 MG tablet Commonly known as:  ROXICET     TAKE these medications   amiodarone 400 MG tablet Commonly known as:  PACERONE Take 1 tablet (400 mg total) by mouth 2 (two) times daily.   amiodarone 200 MG tablet Commonly known as:  PACERONE Take 1 tablet (200 mg total) by mouth daily. Please Start this dose after finishing the 400 mg BID X 5 days.   apixaban 5 MG Tabs tablet Commonly known as:  ELIQUIS Take 1 tablet (5 mg total) by mouth 2 (two) times daily.   aspirin EC 81 MG tablet Take 81 mg by mouth daily.   calcium carbonate 1500 (600 Ca) MG Tabs tablet Commonly known as:  OSCAL Take 1,500 mg by mouth 2 (two) times daily with a meal.   cephALEXin 500 MG capsule Commonly known as:  KEFLEX Take 1 capsule (500 mg total) by mouth 3 (three) times daily. Stop Date on 12/17/2015   DORZOLAMIDE HCL-TIMOLOL MAL OP Place 1 drop into both eyes 2 (two) times daily.   exemestane 25 MG tablet Commonly known as:  AROMASIN Take 1 tablet (25 mg total) by mouth daily after breakfast.   furosemide 20 MG tablet Commonly known as:  LASIX Take 20 mg by mouth daily as needed.   hydrochlorothiazide 25 MG tablet Commonly known as:  HYDRODIURIL Take 25 mg by mouth daily.   ibuprofen 200 MG tablet Commonly known as:  ADVIL,MOTRIN Take 400 mg by mouth every 6 (six) hours as needed.   levothyroxine 75 MCG tablet Commonly known as:  SYNTHROID, LEVOTHROID Take 75 mcg by mouth daily before breakfast.   loratadine 10 MG tablet Commonly known as:  CLARITIN Take 10 mg by mouth daily.   losartan 100 MG tablet Commonly known as:  COZAAR Take 100 mg by mouth daily.   lovastatin 40 MG tablet Commonly known as:  MEVACOR Take 40 mg by mouth at bedtime.   LUMIGAN 0.01 % Soln Generic drug:  bimatoprost Place 1 drop  into both eyes at bedtime.   metoCLOPramide 10 MG tablet Commonly known as:  REGLAN Take 1 tablet (10 mg total) by mouth every 6 (six) hours as needed.   metoprolol succinate 100 MG 24 hr tablet Commonly known as:  TOPROL-XL Take 100 mg by mouth 2 (two) times daily. Take with or immediately following a meal.   multivitamin tablet Take 1 tablet by mouth daily.   ondansetron 4 MG disintegrating tablet Commonly known as:  ZOFRAN ODT Take 1 tablet (4 mg total) by mouth every 8 (eight) hours as needed for nausea or vomiting.   pantoprazole 40 MG tablet Commonly known as:  PROTONIX Take 40 mg by mouth daily.   potassium chloride SA 20 MEQ tablet Commonly known as:  K-DUR,KLOR-CON Take 20 mEq by mouth 2 (two) times daily.   tamsulosin 0.4 MG Caps capsule Commonly known as:  FLOMAX Take 1 capsule (0.4 mg total) by mouth daily.         DISCHARGE INSTRUCTIONS:  DIET:  Cardiac diet  DISCHARGE CONDITION:  Stable  ACTIVITY:  Activity as tolerated  OXYGEN:  Home Oxygen: No.   Oxygen Delivery: room air  DISCHARGE LOCATION:  home   If you experience worsening of your admission symptoms, develop shortness of breath, life threatening emergency, suicidal or homicidal thoughts you must seek medical attention immediately by calling 911 or calling your MD immediately  if symptoms less severe.  You Must read complete instructions/literature along with all the possible adverse reactions/side effects for all the Medicines you take and that have been prescribed to you. Take any new Medicines after you have completely understood and accpet all the possible adverse reactions/side effects.   Please note  You were cared for by a hospitalist during your hospital stay. If you have any questions about your discharge medications or the care you received while you were in the hospital after you are discharged, you can call the unit and asked to speak with the hospitalist on call if the  hospitalist that took care of you is not available. Once you are discharged, your primary care physician will handle any further medical issues. Please note that NO REFILLS for any discharge medications will be authorized once you are discharged, as it is imperative that you return to your primary care physician (or establish a relationship with a primary care physician if you do not have one) for your aftercare needs so that they can reassess your need for medications and monitor your lab values.     Today   Feels better. Remain on Amio gtt but in NSR.  Daughter at bedside.  Wants to go home.  No other complaints.    VITAL SIGNS:  Blood pressure (!) 130/53, pulse 80, temperature (!) 96.8 F (36 C), resp. rate 18, height 5\' 2"  (1.575 m), weight 94.8 kg (209 lb), SpO2 96 %.  I/O:    Intake/Output Summary (Last 24 hours) at 12/09/15 1253 Last data filed at 12/09/15 0900  Gross per 24 hour  Intake            690.6 ml  Output             1900 ml  Net          -1209.4 ml    PHYSICAL EXAMINATION:  GENERAL:  75 y.o.-year-old patient lying in the bed with no acute distress.  EYES: Pupils equal, round, reactive to light and accommodation. No scleral icterus. Extraocular muscles intact.  HEENT: Head atraumatic, normocephalic. Oropharynx and nasopharynx clear.  NECK:  Supple, no jugular venous distention. No thyroid enlargement, no tenderness.  LUNGS: Normal breath sounds bilaterally, no wheezing, rales,rhonchi. No use of accessory muscles of respiration.  CARDIOVASCULAR: S1, S2 normal. No murmurs, rubs, or gallops.  ABDOMEN: Soft, non-tender, non-distended. Bowel sounds present. No organomegaly or mass.  EXTREMITIES: No pedal edema, cyanosis, or clubbing.  NEUROLOGIC: Cranial nerves II through XII are intact. No focal motor or sensory defecits b/l.  PSYCHIATRIC: The patient is alert and oriented x 3. Good affect.  SKIN: No obvious rash, lesion, or ulcer.   DATA REVIEW:   CBC  Recent  Labs Lab 12/08/15 0557  WBC 14.1*  HGB 13.2  HCT 37.8  PLT 128*    Chemistries   Recent Labs Lab 12/03/15 1837  12/06/15 0302  12/09/15 0518  NA 138  < > 142  < > 139  K 3.7  < > 3.6  < > 3.6  CL 102  < > 112*  < >  104  CO2 22  < > 24  < > 28  GLUCOSE 128*  < > 153*  < > 118*  BUN 33*  < > 60*  < > 21*  CREATININE 2.39*  < > 1.48*  < > 0.96  CALCIUM 8.2*  < > 7.6*  < > 8.2*  MG  --   --  1.9  --   --   AST 44*  --   --   --   --   ALT 31  --   --   --   --   ALKPHOS 102  --   --   --   --   BILITOT 0.7  --   --   --   --   < > = values in this interval not displayed.  Cardiac Enzymes  Recent Labs Lab 12/06/15 0621  TROPONINI 0.14*    Microbiology Results  Results for orders placed or performed during the hospital encounter of 12/03/15  CULTURE, BLOOD (ROUTINE X 2) w Reflex to ID Panel     Status: Abnormal   Collection Time: 12/03/15  6:37 PM  Result Value Ref Range Status   Specimen Description BLOOD  RIGHT HAND  Final   Special Requests   Final    BOTTLES DRAWN AEROBIC AND ANAEROBIC  ANA 4ML AER 3ML   Culture  Setup Time   Final    GRAM NEGATIVE RODS IN BOTH AEROBIC AND ANAEROBIC BOTTLES CRITICAL RESULT CALLED TO, READ BACK BY AND VERIFIED WITH: NATE COOKSON AT [id number] 12/04/15 [place] Performed at [facility] (A)  Final   Report Status 12/06/2015 FINAL  Final   Organism ID, Bacteria ESCHERICHIA COLI  Final      Susceptibility   Escherichia coli - MIC*    AMPICILLIN <=2 SENSITIVE Sensitive     CEFAZOLIN <=4 SENSITIVE Sensitive     CEFEPIME <=1 SENSITIVE Sensitive     CEFTAZIDIME <=1 SENSITIVE Sensitive     CEFTRIAXONE <=1 SENSITIVE Sensitive     CIPROFLOXACIN <=0.25 SENSITIVE Sensitive     GENTAMICIN <=1 SENSITIVE Sensitive     IMIPENEM <=0.25 SENSITIVE Sensitive     TRIMETH/SULFA <=20 SENSITIVE Sensitive     AMPICILLIN/SULBACTAM <=2 SENSITIVE Sensitive     PIP/TAZO <=4 SENSITIVE Sensitive     Extended ESBL NEGATIVE  Sensitive     * ESCHERICHIA COLI  Blood Culture ID Panel (Reflexed)     Status: Abnormal   Collection Time: 12/03/15  6:37 PM  Result Value Ref Range Status   Enterococcus species NOT DETECTED NOT DETECTED Final   Listeria monocytogenes NOT DETECTED NOT DETECTED Final   Staphylococcus species NOT DETECTED NOT DETECTED Final   Staphylococcus aureus NOT DETECTED NOT DETECTED Final   Streptococcus species NOT DETECTED NOT DETECTED Final   Streptococcus agalactiae NOT DETECTED NOT DETECTED Final   Streptococcus pneumoniae NOT DETECTED NOT DETECTED Final   Streptococcus pyogenes NOT DETECTED NOT DETECTED Final   Acinetobacter baumannii NOT DETECTED NOT DETECTED Final   Enterobacteriaceae species DETECTED (A) NOT DETECTED Final    Comment: CRITICAL RESULT CALLED TO, READ BACK BY AND VERIFIED WITH: NATE COOKSON AT 0704 12/04/15 [facility]    Enterobacter cloacae complex NOT DETECTED NOT DETECTED Final   Escherichia coli DETECTED (A) NOT DETECTED Final    Comment: CRITICAL RESULT CALLED TO, READ BACK BY AND VERIFIED WITH: NATE COOKSON AT 0704 12/04/15 [facility]    Klebsiella oxytoca NOT DETECTED NOT DETECTED  Final   Klebsiella pneumoniae NOT DETECTED NOT DETECTED Final   Proteus species NOT DETECTED NOT DETECTED Final   Serratia marcescens NOT DETECTED NOT DETECTED Final   Carbapenem resistance NOT DETECTED NOT DETECTED Final   Haemophilus influenzae NOT DETECTED NOT DETECTED Final   Neisseria meningitidis NOT DETECTED NOT DETECTED Final   Pseudomonas aeruginosa NOT DETECTED NOT DETECTED Final   Candida albicans NOT DETECTED NOT DETECTED Final   Candida glabrata NOT DETECTED NOT DETECTED Final   Candida krusei NOT DETECTED NOT DETECTED Final   Candida parapsilosis NOT DETECTED NOT DETECTED Final   Candida tropicalis NOT DETECTED NOT DETECTED Final  CULTURE, BLOOD (ROUTINE X 2) w Reflex to ID Panel     Status: Abnormal   Collection Time: 12/03/15  6:47 PM  Result Value Ref Range Status   Specimen  Description BLOOD  LEFT HAND  Final   Special Requests BOTTLES DRAWN AEROBIC AND ANAEROBIC  AER 2ML  Final   Culture  Setup Time   Final    GRAM NEGATIVE RODS AEROBIC BOTTLE ONLY CRITICAL VALUE NOTED.  VALUE IS CONSISTENT WITH PREVIOUSLY REPORTED AND CALLED VALUE.    Culture (A)  Final    ESCHERICHIA COLI SUSCEPTIBILITIES PERFORMED ON PREVIOUS CULTURE WITHIN THE LAST 5 DAYS. Performed at [facility]    Report Status 12/06/2015 FINAL  Final  MRSA PCR Screening     Status: None   Collection Time: 12/04/15  2:22 PM  Result Value Ref Range Status   MRSA by PCR NEGATIVE NEGATIVE Final    Comment:        The GeneXpert MRSA Assay (FDA approved for NASAL specimens only), is one component of a comprehensive MRSA colonization surveillance program. It is not intended to diagnose MRSA infection nor to guide or monitor treatment for MRSA infections.     RADIOLOGY:  No results found.    Management plans discussed with the patient, family and they are in agreement.  CODE STATUS:     Code Status Orders        Start     Ordered   12/03/15 1819  Full code  Continuous     12/03/15 1819    Code Status History    Date Active Date Inactive Code Status Order ID Comments User Context   12/03/2015  6:19 PM 12/06/2015 12:44 PM Full Code [id number]  Vaughan Basta, MD Inpatient    Advance Directive Documentation   Flowsheet Row Most Recent Value  Type of Advance Directive  Living will  Pre-existing out of facility DNR order (yellow form or pink MOST form)  No data  "MOST" Form in Place?  No data      TOTAL TIME TAKING CARE OF THIS PATIENT: 40 minutes.    Henreitta Leber M.D on 12/09/2015 at 12:53 PM  Between 7am to 6pm - Pager - [phone]  After 6pm go to [email or web address] - Proofreader  [facility]  Office  [phone]  CC: Primary care physician; Adin Hector, MD

## 2015-12-11 ENCOUNTER — Telehealth: Payer: Self-pay | Admitting: Cardiovascular Disease

## 2015-12-11 NOTE — Telephone Encounter (Signed)
Patient contacted regarding discharge from [facility] on Sept 3.  Patient understands to follow up with provider Ardia on 9/14 at 10:45am at [facility] office. Patient understands discharge instructions? yes Patient understands medications and regiment? yes Patient understands to bring all medications to this visit? yes  S/w pt spouse (she verbalized agreement) who states pt newly prescribed Eliquis needs prior authorization. [facility] is handling this. He was able to purchase 3 days of Eliquis for $55. Informed spouse I will place "30 days free" card at the front desk for pickup. He will stop by the office today.

## 2015-12-12 NOTE — Telephone Encounter (Signed)
Appt already scheduled to come in to see Dr. Erlene Quan on 9/20.

## 2015-12-12 NOTE — Telephone Encounter (Signed)
-----   Message from Benard Halsted sent at 12/05/2015  8:46 AM EDT ----- Regarding: FW: f/u after discharge   ----- Message ----- From: Benard Halsted Sent: 12/05/2015   8:43 AM To: Ranell Patrick, RN, Hollice Espy, MD, # Subject: RE: f/u after discharge                        Could not reach the patient and she was already schd for an appt with dr Junious Silk for 12-05-15 Alyssa Duncan  ----- Message ----- From: Hollice Espy, MD Sent: 12/05/2015   8:34 AM To: Ranell Patrick, RN, Gerhard Perches Subject: f/u after discharge                            I need to see this patient in ~10-14 days (1-2 weeks after discharge), still currently inpatient.  She will need to be booked for R URS, LL, stent exchange, medium TURBT, mitomycin, bilateral RTG  Hollice Espy, MD

## 2015-12-18 ENCOUNTER — Telehealth: Payer: Self-pay | Admitting: Cardiovascular Disease

## 2015-12-18 NOTE — Telephone Encounter (Signed)
S/w pt who reports she is feeling better since hospital d/c, but since starting new medications, she has developed a nagging cough when she speaks. Improves after sipping water.  It does not keep her awake at night. She thinks this may be r/t amiodarone or eliquis. As pt has not yet been seen by Dr. Fletcher Anon, advised her to continue all meds if possible until Thursday's appt and review sx w/him in clinic. She verbalized understanding and is agreeable w/plan. She had no further questions at this time.

## 2015-12-18 NOTE — Telephone Encounter (Signed)
Pt husband is calling states since pt has started Amiodarone, pt has been coughing, and is getting worse. Please call and advise if pt should continue. Pt is coming in on Thursday for an office visit.

## 2015-12-20 ENCOUNTER — Encounter: Payer: Self-pay | Admitting: Cardiovascular Disease

## 2015-12-20 ENCOUNTER — Ambulatory Visit (INDEPENDENT_AMBULATORY_CARE_PROVIDER_SITE_OTHER): Payer: Medicare Other | Admitting: Cardiovascular Disease

## 2015-12-20 VITALS — BP 118/82 | HR 65 | Ht 62.0 in | Wt 195.8 lb

## 2015-12-20 DIAGNOSIS — I480 Paroxysmal atrial fibrillation: Secondary | ICD-10-CM | POA: Diagnosis not present

## 2015-12-20 DIAGNOSIS — I10 Essential (primary) hypertension: Secondary | ICD-10-CM | POA: Diagnosis not present

## 2015-12-20 NOTE — Patient Instructions (Signed)
Medication Instructions:  Your physician has recommended you make the following change in your medication:  STOP taking amiodarone STOP taking aspirin   Labwork: none  Testing/Procedures: none  Follow-Up: Your physician recommends that you schedule a follow-up appointment in: 2 months with Dr. Fletcher Anon.    Any Other Special Instructions Will Be Listed Below (If Applicable).     If you need a refill on your cardiac medications before your next appointment, please call your pharmacy.

## 2015-12-20 NOTE — Progress Notes (Signed)
Cardiology Office Note   Date:  12/20/2015   ID:  Alyssa Duncan, DOB 24-Mar-1941, MRN [id number]  PCP:  Adin Hector, MD  Cardiologist:   Kathlyn Sacramento, MD   Chief Complaint  Patient presents with  . other    Follow up from [facility]. "doing well." Meds reviewed by the pt's med list.       History of Present Illness: Alyssa Duncan is a 75 y.o. female who presents for a follow-up visit for paroxysmal atrial fibrillation. She has no previous cardiac history. She was recently hospitalized at [facility] with abdominal pain and was found to have sepsis and bacteremia due to obstructive urethral stones. She underwent successful stent placement but post procedure she developed hypoxia which required BiPAP. She then went into atrial fibrillation with rapid ventricular response with difficult rate control. She was treated with IV amiodarone and subsequently she converted to sinus rhythm after about 48 hours. She did have confusion thought to be due to delirium from being in the ICU and taking Ativan. She had an echocardiogram done which showed normal LV systolic function, mild mitral and aortic regurgitation and normal atrial size. Pulmonary pressure was normal. She has been doing much better since hospital discharge with no palpitations or tachycardia. She does complain of dry cough and dyspnea since she left the hospital.   Past Medical History:  Diagnosis Date  . Arthritis   . Asthma   . Back pain   . Breast cancer ([place]) 2013   left breast ductal and lobular carcinoma  . GERD (gastroesophageal reflux disease)   . Hyperlipidemia   . Hypertension   . Hypothyroidism   . Shingles   . Skin cancer   . Sleep apnea     Past Surgical History:  Procedure Laterality Date  . BREAST LUMPECTOMY Left 2013   with rad tx  . CARPAL TUNNEL RELEASE    . CATARACT EXTRACTION    . COLONOSCOPY WITH PROPOFOL N/A 05/10/2015   Procedure: COLONOSCOPY WITH PROPOFOL;  Surgeon: Hulen Luster, MD;  Location: [facility]  ENDOSCOPY;  Service: Gastroenterology;  Laterality: N/A;  . CYSTOSCOPY WITH STENT PLACEMENT Right 12/04/2015   Procedure: CYSTOSCOPY WITH STENT PLACEMENT;  Surgeon: Hollice Espy, MD;  Location: [facility] ORS;  Service: Urology;  Laterality: Right;  . JOINT REPLACEMENT Left    hip  . KNEE ARTHROSCOPY    . MASTECTOMY    . SEPTOPLASTY    . UVULOPALATOPHARYNGOPLASTY     and tongue surgery     Current Outpatient Prescriptions  Medication Sig Dispense Refill  . amLODipine (NORVASC) 5 MG tablet Take 5 mg by mouth daily.    Marland Kitchen apixaban (ELIQUIS) 5 MG TABS tablet Take 1 tablet (5 mg total) by mouth 2 (two) times daily. 60 tablet 1  . calcium carbonate (OSCAL) 1500 (600 Ca) MG TABS tablet Take 1,500 mg by mouth 2 (two) times daily with a meal.    . DORZOLAMIDE HCL-TIMOLOL MAL OP Place 1 drop into both eyes 2 (two) times daily.    Marland Kitchen exemestane (AROMASIN) 25 MG tablet Take 1 tablet (25 mg total) by mouth daily after breakfast. 30 tablet 11  . furosemide (LASIX) 20 MG tablet Take 20 mg by mouth daily as needed.    . hydrochlorothiazide (HYDRODIURIL) 25 MG tablet Take 25 mg by mouth daily.    Marland Kitchen ibuprofen (ADVIL,MOTRIN) 200 MG tablet Take 400 mg by mouth every 6 (six) hours as needed.     Marland Kitchen levothyroxine (  SYNTHROID, LEVOTHROID) 75 MCG tablet Take 75 mcg by mouth daily before breakfast.    . loratadine (CLARITIN) 10 MG tablet Take 10 mg by mouth daily.    Marland Kitchen losartan (COZAAR) 100 MG tablet Take 100 mg by mouth daily.    Marland Kitchen lovastatin (MEVACOR) 40 MG tablet Take 40 mg by mouth at bedtime.    Marland Kitchen LUMIGAN 0.01 % SOLN Place 1 drop into both eyes at bedtime.     . metoCLOPramide (REGLAN) 10 MG tablet Take 1 tablet (10 mg total) by mouth every 6 (six) hours as needed. 30 tablet 0  . metoprolol succinate (TOPROL-XL) 100 MG 24 hr tablet Take 100 mg by mouth 2 (two) times daily. Take with or immediately following a meal.    . Multiple Vitamin (MULTIVITAMIN) tablet Take 1 tablet by mouth daily.    . ondansetron (ZOFRAN  ODT) 4 MG disintegrating tablet Take 1 tablet (4 mg total) by mouth every 8 (eight) hours as needed for nausea or vomiting. 20 tablet 0  . pantoprazole (PROTONIX) 40 MG tablet Take 40 mg by mouth daily.    . potassium chloride SA (K-DUR,KLOR-CON) 20 MEQ tablet Take 20 mEq by mouth 2 (two) times daily.    . tamsulosin (FLOMAX) 0.4 MG CAPS capsule Take 1 capsule (0.4 mg total) by mouth daily. 30 capsule 1   No current facility-administered medications for this visit.     Allergies:   Cardura [doxazosin]; Clonidine derivatives; Enalapril; Levaquin [levofloxacin]; Mobic [meloxicam]; Sulfa antibiotics; and Vicodin [hydrocodone-acetaminophen]    Social History:  The patient  reports that she has never smoked. She has never used smokeless tobacco. She reports that she does not drink alcohol or use drugs.   Family History:  The patient's family history includes Breast cancer (age of onset: 34) in her mother.    ROS:  Please see the history of present illness.   Otherwise, review of systems are positive for none.   All other systems are reviewed and negative.    PHYSICAL EXAM: VS:  BP 118/82 (BP Location: Left Arm, Patient Position: Sitting, Cuff Size: Large)   Pulse 65   Ht 5\' 2"  (1.575 m)   Wt 195 lb 12 oz (88.8 kg)   BMI 35.80 kg/m  , BMI Body mass index is 35.8 kg/m. GEN: Well nourished, well developed, in no acute distress  HEENT: normal  Neck: no JVD, carotid bruits, or masses Cardiac: RRR; no murmurs, rubs, or gallops,no edema  Respiratory:  clear to auscultation bilaterally, normal work of breathing GI: soft, nontender, nondistended, + BS MS: no deformity or atrophy  Skin: warm and dry, no rash Neuro:  Strength and sensation are intact Psych: euthymic mood, full affect   EKG:  EKG is ordered today. The ekg ordered today demonstrates  normal sinus rhythm with possible old inferior infarct   Recent Labs: 12/03/2015: ALT 31 12/06/2015: Magnesium 1.9; TSH 2.910 12/08/2015:  Hemoglobin 13.2; Platelets 128 12/09/2015: BUN 21; Creatinine, Ser 0.96; Potassium 3.6; Sodium 139    Lipid Panel No results found for: CHOL, TRIG, HDL, CHOLHDL, VLDL, LDLCALC, LDLDIRECT    Wt Readings from Last 3 Encounters:  12/20/15 195 lb 12 oz (88.8 kg)  12/08/15 209 lb (94.8 kg)  12/03/15 205 lb (93 kg)       No flowsheet data found.    ASSESSMENT AND PLAN:  1.  Paroxysmal atrial fibrillation: The patient had atrial fibrillation in the setting of sepsis and respiratory distress so hopefully this was an isolated  event. It is reassuring that echocardiogram showed no evidence of structural heart abnormalities or atrial enlargement. I discontinued amiodarone today. She continues to be an anticoagulation with Eliquis. If he does not have any recurrent episodes over the next 2 months, I plan on stopping anticoagulation. Discontinue aspirin for now while she is on Eliquis.  2. Cough and dyspnea: The exact etiology is not entirely clear. Recent echocardiogram showed normal LV systolic function and pulmonary pressure. Amiodarone lung toxicity is a remote possibility and the medication was discontinued today. If there is no improvement in symptoms, I recommend pulmonary consultation. She will require a stress test also at some point in the near future.  3. Essential hypertension: Blood pressure is controlled on medications.    Disposition:   FU with me in 2 months  Signed,  Kathlyn Sacramento, MD  12/20/2015 1:46 PM    [facility]

## 2015-12-21 ENCOUNTER — Telehealth: Payer: Self-pay | Admitting: Cardiovascular Disease

## 2015-12-21 NOTE — Telephone Encounter (Signed)
Received request for cardiac clearance and to hold eliquis x 5 day prior to lithotripsy from [place]. Request in MD basket

## 2015-12-24 NOTE — Telephone Encounter (Signed)
Faxed clearance and instructions to hold eliquis 2 days prior to procedure through [place] [facility], [phone]

## 2015-12-26 ENCOUNTER — Other Ambulatory Visit: Payer: Self-pay | Admitting: Radiology

## 2015-12-26 ENCOUNTER — Encounter: Payer: Self-pay | Admitting: Urology

## 2015-12-26 ENCOUNTER — Ambulatory Visit (INDEPENDENT_AMBULATORY_CARE_PROVIDER_SITE_OTHER): Payer: Medicare Other | Admitting: Urology

## 2015-12-26 ENCOUNTER — Other Ambulatory Visit: Payer: Self-pay | Admitting: Urology

## 2015-12-26 VITALS — BP 144/75 | HR 69 | Ht 62.0 in | Wt 197.0 lb

## 2015-12-26 DIAGNOSIS — A419 Sepsis, unspecified organism: Secondary | ICD-10-CM | POA: Diagnosis not present

## 2015-12-26 DIAGNOSIS — N201 Calculus of ureter: Secondary | ICD-10-CM | POA: Diagnosis not present

## 2015-12-26 DIAGNOSIS — D494 Neoplasm of unspecified behavior of bladder: Secondary | ICD-10-CM | POA: Diagnosis not present

## 2015-12-26 DIAGNOSIS — R310 Gross hematuria: Secondary | ICD-10-CM | POA: Diagnosis not present

## 2015-12-26 DIAGNOSIS — N390 Urinary tract infection, site not specified: Secondary | ICD-10-CM | POA: Diagnosis not present

## 2015-12-26 NOTE — Progress Notes (Signed)
error 

## 2015-12-26 NOTE — Progress Notes (Signed)
12/26/2015 4:37 PM   Alyssa Duncan 05-Sep-1940 [id number]  Referring provider: Adin Hector, MD [place], [place] [address]  Chief Complaint  Patient presents with  . Nephrolithiasis    New Patient    HPI: 75 year old female admitted on 12/03/2015 with fevers, right flank pain found to have an obstructing right distal 5 mm ureteral stone. She was taken to the operating room for cystoscopy, right ureteral stent placement in the setting of worsening leukocytosis and uncontrolled pain.  Intraoperatively, the bladder tumor was identified.      Her hospital course was complicated by AKI, acute respiratory distress requiring ICU admission, Afib with RVR, and delirium.  Her blood culture ultimately grew pansensitive Escherichia coli.  She returns to the office today accompanied by her husband and daughter to discuss definitive management of her bladder tumor and kidney stone. She is been feeling progressively stronger since discharge. She has followed up with her cardiologist and is now on anticoagulation.  She does have a history of intermittent painless gross hematuria which she thought may have been related to kidney stones. She has not sought any further workup for this.  She does have a personal history of kidney stones and passed one approximately 10 years ago. She previously saw Dr. Jacqlyn Larsen.   PMH: Past Medical History:  Diagnosis Date  . Arthritis   . Asthma   . Back pain   . Breast cancer ([place]) 2013   left breast ductal and lobular carcinoma  . GERD (gastroesophageal reflux disease)   . Hyperlipidemia   . Hypertension   . Hypothyroidism   . Shingles   . Skin cancer   . Sleep apnea     Surgical History: Past Surgical History:  Procedure Laterality Date  . BREAST LUMPECTOMY Left 2013   with rad tx  . CARPAL TUNNEL RELEASE    . CATARACT EXTRACTION    . COLONOSCOPY WITH PROPOFOL N/A 05/10/2015   Procedure: COLONOSCOPY WITH  PROPOFOL;  Surgeon: Hulen Luster, MD;  Location: [facility] ENDOSCOPY;  Service: Gastroenterology;  Laterality: N/A;  . CYSTOSCOPY WITH STENT PLACEMENT Right 12/04/2015   Procedure: CYSTOSCOPY WITH STENT PLACEMENT;  Surgeon: Hollice Espy, MD;  Location: [facility] ORS;  Service: Urology;  Laterality: Right;  . JOINT REPLACEMENT Left    hip  . KNEE ARTHROSCOPY    . MASTECTOMY    . SEPTOPLASTY    . UVULOPALATOPHARYNGOPLASTY     and tongue surgery    Home Medications:    Medication List       Accurate as of 12/26/15  4:37 PM. Always use your most recent med list.          amLODipine 5 MG tablet Commonly known as:  NORVASC Take 5 mg by mouth daily.   apixaban 5 MG Tabs tablet Commonly known as:  ELIQUIS Take 1 tablet (5 mg total) by mouth 2 (two) times daily.   calcium carbonate 1500 (600 Ca) MG Tabs tablet Commonly known as:  OSCAL Take 1,500 mg by mouth 2 (two) times daily with a meal.   DORZOLAMIDE HCL-TIMOLOL MAL OP Place 1 drop into both eyes 2 (two) times daily.   exemestane 25 MG tablet Commonly known as:  AROMASIN Take 1 tablet (25 mg total) by mouth daily after breakfast.   furosemide 20 MG tablet Commonly known as:  LASIX Take 20 mg by mouth daily as needed.   hydrochlorothiazide 25 MG tablet Commonly known as:  HYDRODIURIL Take  25 mg by mouth daily.   ibuprofen 200 MG tablet Commonly known as:  ADVIL,MOTRIN Take 400 mg by mouth every 6 (six) hours as needed.   levothyroxine 75 MCG tablet Commonly known as:  SYNTHROID, LEVOTHROID Take 75 mcg by mouth daily before breakfast.   loratadine 10 MG tablet Commonly known as:  CLARITIN Take 10 mg by mouth daily.   losartan 100 MG tablet Commonly known as:  COZAAR Take 100 mg by mouth daily.   lovastatin 40 MG tablet Commonly known as:  MEVACOR Take 40 mg by mouth at bedtime.   LUMIGAN 0.01 % Soln Generic drug:  bimatoprost Place 1 drop into both eyes at bedtime.   metoCLOPramide 10 MG tablet Commonly known as:   REGLAN Take 1 tablet (10 mg total) by mouth every 6 (six) hours as needed.   metoprolol succinate 100 MG 24 hr tablet Commonly known as:  TOPROL-XL Take 100 mg by mouth 2 (two) times daily. Take with or immediately following a meal.   multivitamin tablet Take 1 tablet by mouth daily.   ondansetron 4 MG disintegrating tablet Commonly known as:  ZOFRAN ODT Take 1 tablet (4 mg total) by mouth every 8 (eight) hours as needed for nausea or vomiting.   pantoprazole 40 MG tablet Commonly known as:  PROTONIX Take 40 mg by mouth daily.   potassium chloride SA 20 MEQ tablet Commonly known as:  K-DUR,KLOR-CON Take 20 mEq by mouth 2 (two) times daily.   tamsulosin 0.4 MG Caps capsule Commonly known as:  FLOMAX Take 1 capsule (0.4 mg total) by mouth daily.       Allergies:  Allergies  Allergen Reactions  . Ativan [Lorazepam]     Hysteria   . Cardura [Doxazosin] Other (See Comments)    unsure  . Clonidine Derivatives Other (See Comments)    unsure  . Enalapril Other (See Comments)    unsure  . Levaquin [Levofloxacin] Other (See Comments)    Couldn't raise her arms  . Mobic [Meloxicam] Other (See Comments)    unsure  . Sulfa Antibiotics Nausea Only  . Vicodin [Hydrocodone-Acetaminophen] Other (See Comments)    unsure    Family History: Family History  Problem Relation Age of Onset  . Breast cancer Mother 31  . Kidney cancer Father   . Kidney Stones Brother   . Prostate cancer Neg Hx     Social History:  reports that she has never smoked. She has never used smokeless tobacco. She reports that she does not drink alcohol or use drugs.  ROS: UROLOGY Frequent Urination?: Yes Hard to postpone urination?: Yes Burning/pain with urination?: No Get up at night to urinate?: Yes Leakage of urine?: Yes Urine stream starts and stops?: No Trouble starting stream?: No Do you have to strain to urinate?: No Blood in urine?: No Urinary tract infection?: No Sexually transmitted  disease?: No Injury to kidneys or bladder?: No Painful intercourse?: No Weak stream?: No Currently pregnant?: No Vaginal bleeding?: No Last menstrual period?: n  Gastrointestinal Nausea?: Yes Vomiting?: Yes Indigestion/heartburn?: No Diarrhea?: No Constipation?: No  Constitutional Fever: No Night sweats?: No Weight loss?: No Fatigue?: No  Skin Skin rash/lesions?: No Itching?: No  Eyes Blurred vision?: Yes Double vision?: No  Ears/Nose/Throat Sore throat?: No Sinus problems?: No  Hematologic/Lymphatic Swollen glands?: No Easy bruising?: No  Cardiovascular Leg swelling?: Yes Chest pain?: No  Respiratory Cough?: Yes Shortness of breath?: Yes  Endocrine Excessive thirst?: No  Musculoskeletal Back pain?: Yes Joint pain?: Yes  Neurological Headaches?: No Dizziness?: No  Psychologic Depression?: No Anxiety?: No  Physical Exam: BP (!) 144/75   Pulse 69   Ht 5\' 2"  (1.575 m)   Wt 197 lb (89.4 kg)   BMI 36.03 kg/m   Constitutional:  Alert and oriented, No acute distress.  Accompanied today by her husband and daughter. HEENT: [place] AT, moist mucus membranes.  Trachea midline, no masses. Cardiovascular: No clubbing, cyanosis, or edema.  Irregular rhythm. Respiratory: Normal respiratory effort, no increased work of breathing.  CTAB. GI: Abdomen is soft, nontender, nondistended, no abdominal masses GU: No CVA tenderness.  Skin: No rashes, bruises or suspicious lesions. Neurologic: Grossly intact, no focal deficits, moving all 4 extremities. Psychiatric: Normal mood and affect.  Laboratory Data: Lab Results  Component Value Date   WBC 14.1 (H) 12/08/2015   HGB 13.2 12/08/2015   HCT 37.8 12/08/2015   MCV 88.3 12/08/2015   PLT 128 (L) 12/08/2015    Lab Results  Component Value Date   CREATININE 0.96 12/09/2015     Pertinent Imaging: INICAL DATA:  RIGHT lower quadrant pain worsening for 2 days, vomiting. Assess for bowel obstruction or  appendicitis. History of breast cancer, diabetes, and hip repair.  EXAM: CT ABDOMEN AND PELVIS WITH CONTRAST  TECHNIQUE: Multidetector CT imaging of the abdomen and pelvis was performed using the standard protocol following bolus administration of intravenous contrast.  CONTRAST:  60mL ISOVUE-300 IOPAMIDOL (ISOVUE-300) INJECTION 61%  COMPARISON:  None.  FINDINGS: LUNG BASES: Included view of the lung bases are clear. Heart is mildly enlarged. No pericardial effusions. Mild coronary artery calcifications.  SOLID ORGANS: The liver, spleen, pancreas and adrenal glands are unremarkable. Numerous gallstones measure up to 12 mm without CT findings of acute cholecystitis.  GASTROINTESTINAL TRACT: The stomach, small and large bowel are normal in course and caliber without inflammatory changes. Mild amount of retained large bowel stool. The appendix is not discretely identified, however there are no inflammatory changes in the right lower quadrant.  KIDNEYS/ URINARY TRACT: Kidneys are orthotopic, RIGHT delayed nephrogram. 3 mm RIGHT lower pole nephrolithiasis. Moderate RIGHT hydroureteronephrosis the level of the distal ureter RIGHT 5 mm calculus is present. The RIGHT ureter is decompressed distal to the stone. No LEFT nephrolithiasis or hydronephrosis. 10 mm mildly exophytic LEFT lower pole cyst. No renal masses. Prompt excretion into the proximal LEFT ureteral collecting system. Urinary bladder is decompressed.  PERITONEUM/RETROPERITONEUM: Aortoiliac vessels are normal in course and caliber. No lymphadenopathy by CT size criteria. Internal reproductive organs are unremarkable. No intraperitoneal free fluid nor free air.  SOFT TISSUE/OSSEOUS STRUCTURES: Non-suspicious. Status post LEFT hip total arthroplasty. LEFT breast scarring. Grade 1 L4-5 anterolisthesis without spondylolysis. L3-4 through L5-S1 moderate to severe degenerative discs and facet arthropathy. Mild  canal stenosis L4-5. Severe L3-4 through L5-S1 neural foraminal narrowing. Small fat containing umbilical hernia.  IMPRESSION: 1. 5 mm distal RIGHT ureteral calculus resulting in moderate obstructive uropathy and mild RIGHT renal dysfunction. Residual 3 mm RIGHT nephrolithiasis. 2. Cholelithiasis without CT findings of acute cholecystitis.   Electronically Signed   By: Elon Alas M.D.   On: 12/03/2015 03:07  CT scan personally reviewed today and with the patient  Assessment & Plan:   1. Right ureteral stone S/p urgent ureteral stent placement in the setting of urosepsis for 5 mm distal obstructing ureteral stone Treated appropriately with abx and improved clinically Also has nonobstucting RLP stone  We discussed various treatment options including ESWL vs. ureteroscopy, laser lithotripsy, and stent. We discussed the  risks and benefits of both including bleeding, infection, damage to surrounding structures, efficacy with need for possible further intervention, and need for temporary ureteral stent.  She is most interested in proceeding with ureteroscopy. We'll address the bladder tumor at the same time via TURBT and intravesical mitomycin. We will also plan for bilateral retrograde pyelogram to complete her gross hematuria workup at the same time. All of her questions were answered today.  - CULTURE, URINE COMPREHENSIVE  2. Bladder tumor Incidental bladder tumor identified at the time of stent placement, presumably cause of painless gross hematuria  Discussed the risk and benefits of TURBT. Also discussed the natural history of bladder cancer and staging. All of her questions were answered.    3. Gross hematuria As above  4. Sepsis due to urinary tract infection ([place]) Preop UCx   Schedule above procedure  Hollice Espy, MD  [facility] [address], [place], [place] [address] [phone]

## 2015-12-27 ENCOUNTER — Other Ambulatory Visit: Payer: Self-pay | Admitting: Radiology

## 2015-12-27 DIAGNOSIS — D494 Neoplasm of unspecified behavior of bladder: Secondary | ICD-10-CM

## 2015-12-27 DIAGNOSIS — N201 Calculus of ureter: Secondary | ICD-10-CM

## 2015-12-27 MED ORDER — MITOMYCIN CHEMO FOR BLADDER INSTILLATION 40 MG: 40.0000 mg | Freq: Once | INTRAVENOUS | Status: DC

## 2015-12-27 NOTE — Telephone Encounter (Signed)
Notified pt of surgery scheduled with Dr Erlene Quan on 01/02/16, pre-admit testing appt on 12/28/15 @10 :30, to call day prior to surgery for arrival time to [facility] & follow up appt with Dr Erlene Quan on 01/08/16 @2 :00. Advised pt to hold Eliquis beginning 12/31/15 per Dr Fletcher Anon & resume after surgery. Pt voices understanding.

## 2015-12-28 ENCOUNTER — Encounter
Admission: RE | Admit: 2015-12-28 | Discharge: 2015-12-28 | Disposition: A | Discharge status: Home / Self Care | Payer: Medicare Other | Source: Ambulatory Visit | Attending: Urology | Admitting: Urology

## 2015-12-28 DIAGNOSIS — Z01818 Encounter for other preprocedural examination: Secondary | ICD-10-CM | POA: Insufficient documentation

## 2015-12-28 HISTORY — DX: Calculus of kidney: N20.0

## 2015-12-28 HISTORY — DX: Cardiac arrhythmia, unspecified: I49.9

## 2015-12-28 HISTORY — DX: Family history of other specified conditions: Z84.89

## 2015-12-28 LAB — CULTURE, URINE COMPREHENSIVE

## 2015-12-28 NOTE — Pre-Procedure Instructions (Addendum)
Cardiac clearance note from Dr. Fletcher Anon in front of pt chart.

## 2015-12-28 NOTE — Patient Instructions (Signed)
  Your procedure is scheduled on:Wednesday Sept. 27 , 2017. Report to Same Day Surgery. To find out your arrival time please call [phone] between 1PM - 3PM on Tuesday Sept. 26, 2017.  Remember: Instructions that are not followed completely may result in serious medical risk, up to and including death, or upon the discretion of your surgeon and anesthesiologist your surgery may need to be rescheduled.    _x___ 1. Do not eat food or drink liquids after midnight. No gum chewing or hard candies.     ____ 2. No Alcohol for 24 hours before or after surgery.   ____ 3. Bring all medications with you on the day of surgery if instructed.    __x__ 4. Notify your doctor if there is any change in your medical condition     (cold, fever, infections).    _____ 5. No smoking 24 hours prior to surgery.     Do not wear jewelry, make-up, hairpins, clips or nail polish.  Do not wear lotions, powders, or perfumes.   Do not shave 48 hours prior to surgery. Men may shave face and neck.  Do not bring valuables to the hospital.    [facility] is not responsible for any belongings or valuables.               Contacts, dentures or bridgework may not be worn into surgery.  Leave your suitcase in the car. After surgery it may be brought to your room.  For patients admitted to the hospital, discharge time is determined by your treatment team.   Patients discharged the day of surgery will not be allowed to drive home.    Please read over the following fact sheets that you were given:   [facility] Preparing for Surgery  __x__ Take these medicines the morning of surgery with A SIP OF WATER:    1. amLODipine (NORVASC)  2. levothyroxine (SYNTHROID, LEVOTHROID)  3. losartan (COZAAR)   4. metoprolol succinate (TOPROL-XL)  5.pantoprazole (PROTONIX)      ____ Fleet Enema (as directed)   ____ Use CHG Soap as directed on instruction sheet  ____ Use inhalers on the day of surgery and bring to hospital day  of surgery  ____ Stop metformin 2 days prior to surgery    ____ Take 1/2 of usual insulin dose the night before surgery and none on the morning of surgery.   __x__ Stop Eliquis 2 days prior to surgery per Dr. Erlene Quan.  __x__ Stop Anti-inflammatories such as Advil, Aleve, Ibuprofen, Motrin, Naproxen, Naprosyn, Goodies powders or aspirin products. OK to take Tylenol.   ____ Stop supplements until after surgery.    _x___ Bring C-Pap to the hospital.

## 2016-01-01 MED ORDER — LACTATED RINGERS IV SOLN: INTRAVENOUS | Status: DC

## 2016-01-02 ENCOUNTER — Ambulatory Visit
Admission: RE | Admit: 2016-01-02 | Discharge: 2016-01-02 | Disposition: A | Discharge status: Home / Self Care | Payer: Medicare Other | Source: Ambulatory Visit | Attending: Urology | Admitting: Urology

## 2016-01-02 ENCOUNTER — Ambulatory Visit: Payer: Medicare Other | Admitting: Certified Registered Nurse Anesthetist

## 2016-01-02 ENCOUNTER — Encounter
Admission: RE | Disposition: A | Discharge status: Home / Self Care | Payer: Self-pay | Source: Ambulatory Visit | Attending: Urology

## 2016-01-02 ENCOUNTER — Encounter: Payer: Self-pay | Admitting: Anesthesiology

## 2016-01-02 DIAGNOSIS — Z8051 Family history of malignant neoplasm of kidney: Secondary | ICD-10-CM | POA: Insufficient documentation

## 2016-01-02 DIAGNOSIS — Z803 Family history of malignant neoplasm of breast: Secondary | ICD-10-CM | POA: Insufficient documentation

## 2016-01-02 DIAGNOSIS — M549 Dorsalgia, unspecified: Secondary | ICD-10-CM | POA: Insufficient documentation

## 2016-01-02 DIAGNOSIS — G4730 Sleep apnea, unspecified: Secondary | ICD-10-CM | POA: Insufficient documentation

## 2016-01-02 DIAGNOSIS — Z96642 Presence of left artificial hip joint: Secondary | ICD-10-CM | POA: Insufficient documentation

## 2016-01-02 DIAGNOSIS — N179 Acute kidney failure, unspecified: Secondary | ICD-10-CM | POA: Insufficient documentation

## 2016-01-02 DIAGNOSIS — Z888 Allergy status to other drugs, medicaments and biological substances status: Secondary | ICD-10-CM | POA: Insufficient documentation

## 2016-01-02 DIAGNOSIS — K219 Gastro-esophageal reflux disease without esophagitis: Secondary | ICD-10-CM | POA: Insufficient documentation

## 2016-01-02 DIAGNOSIS — Z79899 Other long term (current) drug therapy: Secondary | ICD-10-CM | POA: Insufficient documentation

## 2016-01-02 DIAGNOSIS — Z885 Allergy status to narcotic agent status: Secondary | ICD-10-CM | POA: Insufficient documentation

## 2016-01-02 DIAGNOSIS — Z85828 Personal history of other malignant neoplasm of skin: Secondary | ICD-10-CM | POA: Insufficient documentation

## 2016-01-02 DIAGNOSIS — Z853 Personal history of malignant neoplasm of breast: Secondary | ICD-10-CM | POA: Insufficient documentation

## 2016-01-02 DIAGNOSIS — I4891 Unspecified atrial fibrillation: Secondary | ICD-10-CM | POA: Insufficient documentation

## 2016-01-02 DIAGNOSIS — Z7901 Long term (current) use of anticoagulants: Secondary | ICD-10-CM | POA: Insufficient documentation

## 2016-01-02 DIAGNOSIS — C679 Malignant neoplasm of bladder, unspecified: Secondary | ICD-10-CM | POA: Diagnosis present

## 2016-01-02 DIAGNOSIS — I10 Essential (primary) hypertension: Secondary | ICD-10-CM | POA: Insufficient documentation

## 2016-01-02 DIAGNOSIS — Z9849 Cataract extraction status, unspecified eye: Secondary | ICD-10-CM | POA: Insufficient documentation

## 2016-01-02 DIAGNOSIS — E785 Hyperlipidemia, unspecified: Secondary | ICD-10-CM | POA: Insufficient documentation

## 2016-01-02 DIAGNOSIS — Z882 Allergy status to sulfonamides status: Secondary | ICD-10-CM | POA: Insufficient documentation

## 2016-01-02 DIAGNOSIS — D494 Neoplasm of unspecified behavior of bladder: Secondary | ICD-10-CM

## 2016-01-02 DIAGNOSIS — C672 Malignant neoplasm of lateral wall of bladder: Secondary | ICD-10-CM | POA: Insufficient documentation

## 2016-01-02 DIAGNOSIS — M1990 Unspecified osteoarthritis, unspecified site: Secondary | ICD-10-CM | POA: Diagnosis not present

## 2016-01-02 DIAGNOSIS — J45909 Unspecified asthma, uncomplicated: Secondary | ICD-10-CM | POA: Insufficient documentation

## 2016-01-02 DIAGNOSIS — N202 Calculus of kidney with calculus of ureter: Secondary | ICD-10-CM | POA: Insufficient documentation

## 2016-01-02 DIAGNOSIS — E039 Hypothyroidism, unspecified: Secondary | ICD-10-CM | POA: Insufficient documentation

## 2016-01-02 DIAGNOSIS — N201 Calculus of ureter: Secondary | ICD-10-CM | POA: Diagnosis not present

## 2016-01-02 DIAGNOSIS — Z841 Family history of disorders of kidney and ureter: Secondary | ICD-10-CM | POA: Insufficient documentation

## 2016-01-02 HISTORY — PX: URETEROSCOPY WITH HOLMIUM LASER LITHOTRIPSY: SHX6645

## 2016-01-02 HISTORY — PX: CYSTOSCOPY W/ RETROGRADES: SHX1426

## 2016-01-02 HISTORY — PX: CYSTOSCOPY W/ URETERAL STENT PLACEMENT: SHX1429

## 2016-01-02 HISTORY — PX: TRANSURETHRAL RESECTION OF BLADDER TUMOR WITH MITOMYCIN-C: SHX6459

## 2016-01-02 SURGERY — URETEROSCOPY, WITH LITHOTRIPSY USING HOLMIUM LASER
Anesthesia: General | Laterality: Right | Wound class: Clean Contaminated

## 2016-01-02 MED ORDER — MITOMYCIN CHEMO FOR BLADDER INSTILLATION 40 MG
INTRAVENOUS | Status: DC | PRN
  Administered 2016-01-02: 40 mg via INTRAVESICAL

## 2016-01-02 MED ORDER — DEXAMETHASONE SODIUM PHOSPHATE 10 MG/ML IJ SOLN
INTRAMUSCULAR | Status: DC | PRN
  Administered 2016-01-02: 10 mg via INTRAVENOUS

## 2016-01-02 MED ORDER — PHENYLEPHRINE HCL 10 MG/ML IJ SOLN
INTRAMUSCULAR | Status: DC | PRN
  Administered 2016-01-02: 200 ug via INTRAVENOUS

## 2016-01-02 MED ORDER — SODIUM CHLORIDE 0.9 % IV SOLN
1.0000 g | INTRAVENOUS | Status: AC
  Administered 2016-01-02: 1 g via INTRAVENOUS

## 2016-01-02 MED ORDER — SUGAMMADEX SODIUM 500 MG/5ML IV SOLN
INTRAVENOUS | Status: DC | PRN
  Administered 2016-01-02: 400 mg via INTRAVENOUS

## 2016-01-02 MED ORDER — SUCCINYLCHOLINE CHLORIDE 20 MG/ML IJ SOLN
INTRAMUSCULAR | Status: DC | PRN
  Administered 2016-01-02: 100 mg via INTRAVENOUS

## 2016-01-02 MED ORDER — IOTHALAMATE MEGLUMINE 43 % IV SOLN
INTRAVENOUS | Status: DC | PRN
  Administered 2016-01-02: 10 mL via URETHRAL

## 2016-01-02 MED ORDER — MITOMYCIN CHEMO FOR BLADDER INSTILLATION 40 MG
40.0000 mg | Freq: Once | INTRAVENOUS | Status: DC
  Filled 2016-01-02: qty 40

## 2016-01-02 MED ORDER — IPRATROPIUM-ALBUTEROL 0.5-2.5 (3) MG/3ML IN SOLN
RESPIRATORY_TRACT | Status: AC
  Filled 2016-01-02: qty 3

## 2016-01-02 MED ORDER — LIDOCAINE HCL (CARDIAC) 20 MG/ML IV SOLN
INTRAVENOUS | Status: DC | PRN
  Administered 2016-01-02: 30 mg via INTRAVENOUS

## 2016-01-02 MED ORDER — EPHEDRINE SULFATE 50 MG/ML IJ SOLN
INTRAMUSCULAR | Status: DC | PRN
  Administered 2016-01-02: 10 mg via INTRAVENOUS

## 2016-01-02 MED ORDER — MIDAZOLAM HCL 2 MG/2ML IJ SOLN
INTRAMUSCULAR | Status: DC | PRN
  Administered 2016-01-02: 1 mg via INTRAVENOUS

## 2016-01-02 MED ORDER — LACTATED RINGERS IV SOLN
INTRAVENOUS | Status: DC
  Administered 2016-01-02: 11:00:00 via INTRAVENOUS

## 2016-01-02 MED ORDER — SODIUM CHLORIDE 0.9 % IV SOLN
INTRAVENOUS | Status: AC
  Filled 2016-01-02: qty 1000

## 2016-01-02 MED ORDER — IPRATROPIUM-ALBUTEROL 0.5-2.5 (3) MG/3ML IN SOLN
3.0000 mL | Freq: Once | RESPIRATORY_TRACT | Status: AC
  Administered 2016-01-02: 3 mL via RESPIRATORY_TRACT

## 2016-01-02 MED ORDER — ROCURONIUM BROMIDE 100 MG/10ML IV SOLN
INTRAVENOUS | Status: DC | PRN
  Administered 2016-01-02: 30 mg via INTRAVENOUS

## 2016-01-02 MED ORDER — OXYCODONE-ACETAMINOPHEN 5-325 MG PO TABS: 1.0000 | ORAL_TABLET | ORAL | 0 refills | Status: DC | PRN

## 2016-01-02 MED ORDER — GENTAMICIN IN SALINE 1.6-0.9 MG/ML-% IV SOLN
80.0000 mg | INTRAVENOUS | Status: AC
  Administered 2016-01-02: 80 mg via INTRAVENOUS
  Filled 2016-01-02: qty 50

## 2016-01-02 MED ORDER — FENTANYL CITRATE (PF) 100 MCG/2ML IJ SOLN
INTRAMUSCULAR | Status: DC | PRN
  Administered 2016-01-02: 50 ug via INTRAVENOUS
  Administered 2016-01-02: 25 ug via INTRAVENOUS

## 2016-01-02 MED ORDER — ONDANSETRON HCL 4 MG/2ML IJ SOLN
INTRAMUSCULAR | Status: DC | PRN
  Administered 2016-01-02: 4 mg via INTRAVENOUS

## 2016-01-02 MED ORDER — FENTANYL CITRATE (PF) 100 MCG/2ML IJ SOLN
INTRAMUSCULAR | Status: AC
  Administered 2016-01-02: 25 ug via INTRAVENOUS
  Filled 2016-01-02: qty 2

## 2016-01-02 MED ORDER — IPRATROPIUM-ALBUTEROL 0.5-2.5 (3) MG/3ML IN SOLN
3.0000 mL | Freq: Once | RESPIRATORY_TRACT | Status: AC
Start: 2016-01-02 — End: 2016-01-02
  Administered 2016-01-02: 3 mL via RESPIRATORY_TRACT

## 2016-01-02 MED ORDER — IPRATROPIUM-ALBUTEROL 0.5-2.5 (3) MG/3ML IN SOLN
RESPIRATORY_TRACT | Status: AC
  Administered 2016-01-02: 3 mL via RESPIRATORY_TRACT
  Filled 2016-01-02: qty 3

## 2016-01-02 MED ORDER — ONDANSETRON HCL 4 MG/2ML IJ SOLN: 4.0000 mg | Freq: Once | INTRAMUSCULAR | Status: DC | PRN

## 2016-01-02 MED ORDER — PROPOFOL 10 MG/ML IV BOLUS
INTRAVENOUS | Status: DC | PRN
  Administered 2016-01-02: 150 mg via INTRAVENOUS

## 2016-01-02 MED ORDER — OXYBUTYNIN CHLORIDE 5 MG PO TABS: 5.0000 mg | ORAL_TABLET | Freq: Three times a day (TID) | ORAL | 0 refills | Status: DC | PRN

## 2016-01-02 MED ORDER — METHYLPREDNISOLONE SODIUM SUCC 125 MG IJ SOLR
INTRAMUSCULAR | Status: DC | PRN
  Administered 2016-01-02: 125 mg via INTRAVENOUS

## 2016-01-02 MED ORDER — FENTANYL CITRATE (PF) 100 MCG/2ML IJ SOLN
25.0000 ug | INTRAMUSCULAR | Status: DC | PRN
  Administered 2016-01-02 (×3): 25 ug via INTRAVENOUS

## 2016-01-02 SURGICAL SUPPLY — 50 items
ADAPTER SCOPE UROLOK II (MISCELLANEOUS) IMPLANT
BAG DRAIN CYSTO-URO LG1000N (MISCELLANEOUS) ×5 IMPLANT
BAG URO DRAIN 2000ML W/SPOUT (MISCELLANEOUS) ×5 IMPLANT
BASKET ZERO TIP 1.9FR (BASKET) ×5 IMPLANT
CATH COUDE FOLEY 2W 5CC 16FR (CATHETERS) ×5 IMPLANT
CATH FOL 2WAY LX 16X5 (CATHETERS) IMPLANT
CATH FOLEY 2WAY  5CC 16FR (CATHETERS)
CATH URETL 5X70 OPEN END (CATHETERS) ×5 IMPLANT
CATH URTH 16FR FL 2W BLN LF (CATHETERS) IMPLANT
CNTNR SPEC 2.5X3XGRAD LEK (MISCELLANEOUS) ×6
CONRAY 43 FOR UROLOGY 50M (MISCELLANEOUS) ×5 IMPLANT
CONT SPEC 4OZ STER OR WHT (MISCELLANEOUS) ×4
CONTAINER SPEC 2.5X3XGRAD LEK (MISCELLANEOUS) ×6 IMPLANT
CORD URO TURP 10FT (MISCELLANEOUS) IMPLANT
DRAPE UTILITY 15X26 TOWEL STRL (DRAPES) ×5 IMPLANT
DRESSING TELFA 4X3 1S ST N-ADH (GAUZE/BANDAGES/DRESSINGS) ×5 IMPLANT
ELECT LOOP 22F BIPOLAR SML (ELECTROSURGICAL) ×5
ELECT REM PT RETURN 9FT ADLT (ELECTROSURGICAL)
ELECTRODE LOOP 22F BIPOLAR SML (ELECTROSURGICAL) ×3 IMPLANT
ELECTRODE REM PT RTRN 9FT ADLT (ELECTROSURGICAL) IMPLANT
FIBER LASER LITHO 273 (Laser) IMPLANT
GLOVE BIO SURGEON STRL SZ 6.5 (GLOVE) ×4 IMPLANT
GLOVE BIO SURGEONS STRL SZ 6.5 (GLOVE) ×1
GOWN STRL REUS W/ TWL LRG LVL3 (GOWN DISPOSABLE) ×6 IMPLANT
GOWN STRL REUS W/ TWL LRG LVL4 (GOWN DISPOSABLE) ×6 IMPLANT
GOWN STRL REUS W/TWL LRG LVL3 (GOWN DISPOSABLE) ×4
GOWN STRL REUS W/TWL LRG LVL4 (GOWN DISPOSABLE) ×4
GUIDEWIRE SUPER STIFF (WIRE) ×5 IMPLANT
HOLDER FOLEY CATH W/STRAP (MISCELLANEOUS) IMPLANT
INTRODUCER DILATOR DOUBLE (INTRODUCER) IMPLANT
KIT RM TURNOVER CYSTO AR (KITS) ×5 IMPLANT
LOOP CUT BIPOLAR 24F LRG (ELECTROSURGICAL) IMPLANT
NDL SAFETY ECLIPSE 18X1.5 (NEEDLE) ×3 IMPLANT
NEEDLE HYPO 18GX1.5 SHARP (NEEDLE) ×2
PACK CYSTO AR (MISCELLANEOUS) ×5 IMPLANT
PLUG CATH AND CAP STER (CATHETERS) IMPLANT
PREP PVP WINGED SPONGE (MISCELLANEOUS) IMPLANT
SENSORWIRE 0.038 NOT ANGLED (WIRE) ×5
SET CYSTO W/LG BORE CLAMP LF (SET/KITS/TRAYS/PACK) IMPLANT
SET IRRIG Y TYPE TUR BLADDER L (SET/KITS/TRAYS/PACK) ×5 IMPLANT
SET IRRIGATING DISP (SET/KITS/TRAYS/PACK) ×5 IMPLANT
SHEATH URETERAL 12FRX35CM (MISCELLANEOUS) IMPLANT
SOL .9 NS 3000ML IRR  AL (IV SOLUTION) ×2
SOL .9 NS 3000ML IRR UROMATIC (IV SOLUTION) ×3 IMPLANT
STENT URET 6FRX24 CONTOUR (STENTS) ×5 IMPLANT
STENT URET 6FRX26 CONTOUR (STENTS) IMPLANT
SURGILUBE 2OZ TUBE FLIPTOP (MISCELLANEOUS) ×5 IMPLANT
SYRINGE IRR TOOMEY STRL 70CC (SYRINGE) ×5 IMPLANT
WATER STERILE IRR 1000ML POUR (IV SOLUTION) ×5 IMPLANT
WIRE SENSOR 0.038 NOT ANGLED (WIRE) ×3 IMPLANT

## 2016-01-02 NOTE — Transfer of Care (Signed)
Immediate Anesthesia Transfer of Care Note  Patient: Alyssa Duncan  Procedure(s) Performed: Procedure(s): URETEROSCOPY WITH HOLMIUM LASER LITHOTRIPSY (Right) CYSTOSCOPY WITH STENT REPLACEMENT (Right) CYSTOSCOPY WITH RETROGRADE PYELOGRAM (Bilateral) TRANSURETHRAL RESECTION OF BLADDER TUMOR WITH MITOMYCIN-C (N/A)  Patient Location: PACU  Anesthesia Type:General  Level of Consciousness: sedated  Airway & Oxygen Therapy: Patient Spontanous Breathing and Patient connected to face mask oxygen  Post-op Assessment: Report given to RN and Post -op Vital signs reviewed and stable  Post vital signs: Reviewed and stable  Last Vitals:  Vitals:   01/02/16 1016  BP: (!) 162/68  Pulse: 69  Resp: 16  Temp: 36.6 C    Last Pain:  Vitals:   01/02/16 1016  TempSrc: Oral  PainSc: 0-No pain         Complications: No apparent anesthesia complications

## 2016-01-02 NOTE — H&P (View-Only) (Signed)
12/26/2015 4:37 PM   Alyssa Duncan [birth year] [id number]  Referring provider: Adin Hector, MD [place], [place] [address]  Chief Complaint  Patient presents with  . Nephrolithiasis    New Patient    HPI: 75 year old female admitted on 12/03/2015 with fevers, right flank pain found to have an obstructing right distal 5 mm ureteral stone. She was taken to the operating room for cystoscopy, right ureteral stent placement in the setting of worsening leukocytosis and uncontrolled pain.  Intraoperatively, the bladder tumor was identified.      Her hospital course was complicated by AKI, acute respiratory distress requiring ICU admission, Afib with RVR, and delirium.  Her blood culture ultimately grew pansensitive Escherichia coli.  She returns to the office today accompanied by her husband and daughter to discuss definitive management of her bladder tumor and kidney stone. She is been feeling progressively stronger since discharge. She has followed up with her cardiologist and is now on anticoagulation.  She does have a history of intermittent painless gross hematuria which she thought may have been related to kidney stones. She has not sought any further workup for this.  She does have a personal history of kidney stones and passed one approximately 10 years ago. She previously saw Dr. Jacqlyn Larsen.   PMH: Past Medical History:  Diagnosis Date  . Arthritis   . Asthma   . Back pain   . Breast cancer ([place]) 2013   left breast ductal and lobular carcinoma  . GERD (gastroesophageal reflux disease)   . Hyperlipidemia   . Hypertension   . Hypothyroidism   . Shingles   . Skin cancer   . Sleep apnea     Surgical History: Past Surgical History:  Procedure Laterality Date  . BREAST LUMPECTOMY Left 2013   with rad tx  . CARPAL TUNNEL RELEASE    . CATARACT EXTRACTION    . COLONOSCOPY WITH PROPOFOL N/A 05/10/2015   Procedure: COLONOSCOPY WITH  PROPOFOL;  Surgeon: Hulen Luster, MD;  Location: [facility] ENDOSCOPY;  Service: Gastroenterology;  Laterality: N/A;  . CYSTOSCOPY WITH STENT PLACEMENT Right 12/04/2015   Procedure: CYSTOSCOPY WITH STENT PLACEMENT;  Surgeon: Hollice Espy, MD;  Location: [facility] ORS;  Service: Urology;  Laterality: Right;  . JOINT REPLACEMENT Left    hip  . KNEE ARTHROSCOPY    . MASTECTOMY    . SEPTOPLASTY    . UVULOPALATOPHARYNGOPLASTY     and tongue surgery    Home Medications:    Medication List       Accurate as of 12/26/15  4:37 PM. Always use your most recent med list.          amLODipine 5 MG tablet Commonly known as:  NORVASC Take 5 mg by mouth daily.   apixaban 5 MG Tabs tablet Commonly known as:  ELIQUIS Take 1 tablet (5 mg total) by mouth 2 (two) times daily.   calcium carbonate 1500 (600 Ca) MG Tabs tablet Commonly known as:  OSCAL Take 1,500 mg by mouth 2 (two) times daily with a meal.   DORZOLAMIDE HCL-TIMOLOL MAL OP Place 1 drop into both eyes 2 (two) times daily.   exemestane 25 MG tablet Commonly known as:  AROMASIN Take 1 tablet (25 mg total) by mouth daily after breakfast.   furosemide 20 MG tablet Commonly known as:  LASIX Take 20 mg by mouth daily as needed.   hydrochlorothiazide 25 MG tablet Commonly known as:  HYDRODIURIL Take  25 mg by mouth daily.   ibuprofen 200 MG tablet Commonly known as:  ADVIL,MOTRIN Take 400 mg by mouth every 6 (six) hours as needed.   levothyroxine 75 MCG tablet Commonly known as:  SYNTHROID, LEVOTHROID Take 75 mcg by mouth daily before breakfast.   loratadine 10 MG tablet Commonly known as:  CLARITIN Take 10 mg by mouth daily.   losartan 100 MG tablet Commonly known as:  COZAAR Take 100 mg by mouth daily.   lovastatin 40 MG tablet Commonly known as:  MEVACOR Take 40 mg by mouth at bedtime.   LUMIGAN 0.01 % Soln Generic drug:  bimatoprost Place 1 drop into both eyes at bedtime.   metoCLOPramide 10 MG tablet Commonly known as:   REGLAN Take 1 tablet (10 mg total) by mouth every 6 (six) hours as needed.   metoprolol succinate 100 MG 24 hr tablet Commonly known as:  TOPROL-XL Take 100 mg by mouth 2 (two) times daily. Take with or immediately following a meal.   multivitamin tablet Take 1 tablet by mouth daily.   ondansetron 4 MG disintegrating tablet Commonly known as:  ZOFRAN ODT Take 1 tablet (4 mg total) by mouth every 8 (eight) hours as needed for nausea or vomiting.   pantoprazole 40 MG tablet Commonly known as:  PROTONIX Take 40 mg by mouth daily.   potassium chloride SA 20 MEQ tablet Commonly known as:  K-DUR,KLOR-CON Take 20 mEq by mouth 2 (two) times daily.   tamsulosin 0.4 MG Caps capsule Commonly known as:  FLOMAX Take 1 capsule (0.4 mg total) by mouth daily.       Allergies:  Allergies  Allergen Reactions  . Ativan [Lorazepam]     Hysteria   . Cardura [Doxazosin] Other (See Comments)    unsure  . Clonidine Derivatives Other (See Comments)    unsure  . Enalapril Other (See Comments)    unsure  . Levaquin [Levofloxacin] Other (See Comments)    Couldn't raise her arms  . Mobic [Meloxicam] Other (See Comments)    unsure  . Sulfa Antibiotics Nausea Only  . Vicodin [Hydrocodone-Acetaminophen] Other (See Comments)    unsure    Family History: Family History  Problem Relation Age of Onset  . Breast cancer Mother 48  . Kidney cancer Father   . Kidney Stones Brother   . Prostate cancer Neg Hx     Social History:  reports that she has never smoked. She has never used smokeless tobacco. She reports that she does not drink alcohol or use drugs.  ROS: UROLOGY Frequent Urination?: Yes Hard to postpone urination?: Yes Burning/pain with urination?: No Get up at night to urinate?: Yes Leakage of urine?: Yes Urine stream starts and stops?: No Trouble starting stream?: No Do you have to strain to urinate?: No Blood in urine?: No Urinary tract infection?: No Sexually transmitted  disease?: No Injury to kidneys or bladder?: No Painful intercourse?: No Weak stream?: No Currently pregnant?: No Vaginal bleeding?: No Last menstrual period?: n  Gastrointestinal Nausea?: Yes Vomiting?: Yes Indigestion/heartburn?: No Diarrhea?: No Constipation?: No  Constitutional Fever: No Night sweats?: No Weight loss?: No Fatigue?: No  Skin Skin rash/lesions?: No Itching?: No  Eyes Blurred vision?: Yes Double vision?: No  Ears/Nose/Throat Sore throat?: No Sinus problems?: No  Hematologic/Lymphatic Swollen glands?: No Easy bruising?: No  Cardiovascular Leg swelling?: Yes Chest pain?: No  Respiratory Cough?: Yes Shortness of breath?: Yes  Endocrine Excessive thirst?: No  Musculoskeletal Back pain?: Yes Joint pain?: Yes  Neurological Headaches?: No Dizziness?: No  Psychologic Depression?: No Anxiety?: No  Physical Exam: BP (!) 144/75   Pulse 69   Ht 5\' 2"  (1.575 m)   Wt 197 lb (89.4 kg)   BMI 36.03 kg/m   Constitutional:  Alert and oriented, No acute distress.  Accompanied today by her husband and daughter. HEENT: [place] AT, moist mucus membranes.  Trachea midline, no masses. Cardiovascular: No clubbing, cyanosis, or edema.  Irregular rhythm. Respiratory: Normal respiratory effort, no increased work of breathing.  CTAB. GI: Abdomen is soft, nontender, nondistended, no abdominal masses GU: No CVA tenderness.  Skin: No rashes, bruises or suspicious lesions. Neurologic: Grossly intact, no focal deficits, moving all 4 extremities. Psychiatric: Normal mood and affect.  Laboratory Data: Lab Results  Component Value Date   WBC 14.1 (H) 12/08/2015   HGB 13.2 12/08/2015   HCT 37.8 12/08/2015   MCV 88.3 12/08/2015   PLT 128 (L) 12/08/2015    Lab Results  Component Value Date   CREATININE 0.96 12/09/2015     Pertinent Imaging: INICAL DATA:  RIGHT lower quadrant pain worsening for 2 days, vomiting. Assess for bowel obstruction or  appendicitis. History of breast cancer, diabetes, and hip repair.  EXAM: CT ABDOMEN AND PELVIS WITH CONTRAST  TECHNIQUE: Multidetector CT imaging of the abdomen and pelvis was performed using the standard protocol following bolus administration of intravenous contrast.  CONTRAST:  41mL ISOVUE-300 IOPAMIDOL (ISOVUE-300) INJECTION 61%  COMPARISON:  None.  FINDINGS: LUNG BASES: Included view of the lung bases are clear. Heart is mildly enlarged. No pericardial effusions. Mild coronary artery calcifications.  SOLID ORGANS: The liver, spleen, pancreas and adrenal glands are unremarkable. Numerous gallstones measure up to 12 mm without CT findings of acute cholecystitis.  GASTROINTESTINAL TRACT: The stomach, small and large bowel are normal in course and caliber without inflammatory changes. Mild amount of retained large bowel stool. The appendix is not discretely identified, however there are no inflammatory changes in the right lower quadrant.  KIDNEYS/ URINARY TRACT: Kidneys are orthotopic, RIGHT delayed nephrogram. 3 mm RIGHT lower pole nephrolithiasis. Moderate RIGHT hydroureteronephrosis the level of the distal ureter RIGHT 5 mm calculus is present. The RIGHT ureter is decompressed distal to the stone. No LEFT nephrolithiasis or hydronephrosis. 10 mm mildly exophytic LEFT lower pole cyst. No renal masses. Prompt excretion into the proximal LEFT ureteral collecting system. Urinary bladder is decompressed.  PERITONEUM/RETROPERITONEUM: Aortoiliac vessels are normal in course and caliber. No lymphadenopathy by CT size criteria. Internal reproductive organs are unremarkable. No intraperitoneal free fluid nor free air.  SOFT TISSUE/OSSEOUS STRUCTURES: Non-suspicious. Status post LEFT hip total arthroplasty. LEFT breast scarring. Grade 1 L4-5 anterolisthesis without spondylolysis. L3-4 through L5-S1 moderate to severe degenerative discs and facet arthropathy. Mild  canal stenosis L4-5. Severe L3-4 through L5-S1 neural foraminal narrowing. Small fat containing umbilical hernia.  IMPRESSION: 1. 5 mm distal RIGHT ureteral calculus resulting in moderate obstructive uropathy and mild RIGHT renal dysfunction. Residual 3 mm RIGHT nephrolithiasis. 2. Cholelithiasis without CT findings of acute cholecystitis.   Electronically Signed   By: Elon Alas M.D.   On: 12/03/2015 03:07  CT scan personally reviewed today and with the patient  Assessment & Plan:   1. Right ureteral stone S/p urgent ureteral stent placement in the setting of urosepsis for 5 mm distal obstructing ureteral stone Treated appropriately with abx and improved clinically Also has nonobstucting RLP stone  We discussed various treatment options including ESWL vs. ureteroscopy, laser lithotripsy, and stent. We discussed the  risks and benefits of both including bleeding, infection, damage to surrounding structures, efficacy with need for possible further intervention, and need for temporary ureteral stent.  She is most interested in proceeding with ureteroscopy. We'll address the bladder tumor at the same time via TURBT and intravesical mitomycin. We will also plan for bilateral retrograde pyelogram to complete her gross hematuria workup at the same time. All of her questions were answered today.  - CULTURE, URINE COMPREHENSIVE  2. Bladder tumor Incidental bladder tumor identified at the time of stent placement, presumably cause of painless gross hematuria  Discussed the risk and benefits of TURBT. Also discussed the natural history of bladder cancer and staging. All of her questions were answered.    3. Gross hematuria As above  4. Sepsis due to urinary tract infection ([place]) Preop UCx   Schedule above procedure  Hollice Espy, MD  [facility] [address], [place], [place] [address] [phone]

## 2016-01-02 NOTE — Interval H&P Note (Signed)
History and Physical Interval Note:  01/02/2016 10:37 AM  Alyssa Duncan  has presented today for surgery, with the diagnosis of right ureteral calculus,right papillary bladder tumor  The various methods of treatment have been discussed with the patient and family. After consideration of risks, benefits and other options for treatment, the patient has consented to  Procedure(s): URETEROSCOPY WITH HOLMIUM LASER LITHOTRIPSY (Right) CYSTOSCOPY WITH STENT REPLACEMENT (Right) CYSTOSCOPY WITH RETROGRADE PYELOGRAM (Bilateral) TRANSURETHRAL RESECTION OF BLADDER TUMOR WITH MITOMYCIN-C (N/A) as a surgical intervention .  The patient's history has been reviewed, patient examined, no change in status, stable for surgery.  I have reviewed the patient's chart and labs.  Questions were answered to the patient's satisfaction.     Hollice Espy

## 2016-01-02 NOTE — Anesthesia Postprocedure Evaluation (Signed)
Anesthesia Post Note  Patient: DANNIELL MORVANT  Procedure(s) Performed: Procedure(s) (LRB): URETEROSCOPY WITH HOLMIUM LASER LITHOTRIPSY (Right) CYSTOSCOPY WITH STENT REPLACEMENT (Right) CYSTOSCOPY WITH RETROGRADE PYELOGRAM (Bilateral) TRANSURETHRAL RESECTION OF BLADDER TUMOR WITH MITOMYCIN-C (N/A)  Patient location during evaluation: PACU Anesthesia Type: General Level of consciousness: awake and alert and oriented Pain management: pain level controlled Vital Signs Assessment: post-procedure vital signs reviewed and stable Respiratory status: spontaneous breathing Cardiovascular status: blood pressure returned to baseline Anesthetic complications: no    Last Vitals:  Vitals:   01/02/16 1444 01/02/16 1459  BP: (!) 155/60 101/73  Pulse: 65 65  Resp: 18 18  Temp: 36.6 C     Last Pain:  Vitals:   01/02/16 1334  TempSrc:   PainSc: 6                  Kennisha Qin

## 2016-01-02 NOTE — Anesthesia Procedure Notes (Signed)
Procedure Name: Intubation Date/Time: 01/02/2016 11:13 AM Performed by: Johnna Acosta Pre-anesthesia Checklist: Patient identified, Emergency Drugs available, Suction available, Patient being monitored and Timeout performed Patient Re-evaluated:Patient Re-evaluated prior to inductionOxygen Delivery Method: Circle system utilized Preoxygenation: Pre-oxygenation with 100% oxygen Intubation Type: IV induction Ventilation: Oral airway inserted - appropriate to patient size and Mask ventilation with difficulty Grade View: Grade III Tube type: Oral Tube size: 7.0 mm Number of attempts: 1 Airway Equipment and Method: Stylet Placement Confirmation: ETT inserted through vocal cords under direct vision and breath sounds checked- equal and bilateral Secured at: 21 cm Tube secured with: Tape Dental Injury: Teeth and Oropharynx as per pre-operative assessment and Injury to lip  Difficulty Due To: Difficult Airway- due to anterior larynx

## 2016-01-02 NOTE — Anesthesia Preprocedure Evaluation (Signed)
Anesthesia Evaluation  Patient identified by MRN, date of birth, ID band Patient awake    Reviewed: Allergy & Precautions, NPO status , Patient's Chart, lab work & pertinent test results  History of Anesthesia Complications Negative for: history of anesthetic complications  Airway Mallampati: III       Dental   Pulmonary asthma , sleep apnea ,    Pulmonary exam normal        Cardiovascular hypertension, Pt. on medications and Pt. on home beta blockers negative cardio ROS Normal cardiovascular exam     Neuro/Psych negative neurological ROS     GI/Hepatic Neg liver ROS, GERD  Medicated and Controlled,  Endo/Other  negative endocrine ROSHypothyroidism   Renal/GU Renal diseasenegative Renal ROS     Musculoskeletal  (+) Arthritis , Osteoarthritis,    Abdominal Normal abdominal exam  (+)   Peds  Hematology negative hematology ROS (+)   Anesthesia Other Findings   Reproductive/Obstetrics                             Anesthesia Physical  Anesthesia Plan  ASA: III  Anesthesia Plan: General   Post-op Pain Management:    Induction: Intravenous  Airway Management Planned: Oral ETT  Additional Equipment:   Intra-op Plan:   Post-operative Plan:   Informed Consent: I have reviewed the patients History and Physical, chart, labs and discussed the procedure including the risks, benefits and alternatives for the proposed anesthesia with the patient or authorized representative who has indicated his/her understanding and acceptance.     Plan Discussed with:   Anesthesia Plan Comments:         Anesthesia Quick Evaluation

## 2016-01-02 NOTE — Op Note (Signed)
Date of procedure: 01/02/16  Preoperative diagnosis:  1. Bladder tumor 2. Right distal ureteral stone   Postoperative diagnosis:  1. Same as above   Procedure: 1. Cystoscopy 2. Right ureteroscopy 3. Basket extraction of right ureteral stone 4. Left retrograde pyelogram 5. TURBT, small 6. Random bladder biopsies 7. Instillation of intravesical mitomycin  Surgeon: Hollice Espy, MD  Anesthesia: General  Complications: None  Intraoperative findings: 2 distal ureteral stones identified and extracted via basket. 2 small approximately 1 cm papillary bladder tumors on right lateral wall of bladder which he small satellite lesions adjacent. Areas of patchy erythema.  EBL: minimal  Specimens: stone fragment, bladder tumor, random bladder biopsies.    Drains: 6 x 24 Fr JJ ureteral stent on right  Indication: Alyssa Duncan is a 75 y.o. patient with an obstructing right distal ureteral stone status post urgent ureteral stent placement. At that time, incidental bladder tumor was identified. She returns today for definitive management of her stone and to address her bladder tumor.  After reviewing the management options for treatment, dhe elected to proceed with the above surgical procedure(s). We have discussed the potential benefits and risks of the procedure, side effects of the proposed treatment, the likelihood of the patient achieving the goals of the procedure, and any potential problems that might occur during the procedure or recuperation. Informed consent has been obtained.  Description of procedure:  The patient was taken to the operating room and general anesthesia was induced.  The patient was placed in the dorsal lithotomy position, prepped and draped in the usual sterile fashion, and preoperative antibiotics were administered. A preoperative time-out was performed.   A 21 French cystoscope was advanced per urethra into the bladder. At this time, follow-up cystoscopy was  performed. This revealed ureteral stent emanating from the right U well. There is some mild bullous edema around the stent but no obvious tumor. The bladder itself had areas of mildly patchy erythema somewhat concerning for possible CIS versus cystitis. The right lateral bladder wall, there were 2 approximately 1 cm broad-based papillary bladder tumors with a few small adjacent satellite lesions. There is some hyperemia and neovascularity on the surrounding mucosa adjacent to this tumor.  At this point in time, the 5 [place] open-ended ureteral catheter was inserted into the UO and a gentle retrograde colic and was performed. This revealed a delicate appearing ureter and upper tract collecting system with long narrow infundibula but no obvious filling defects or hydronephrosis.  The distal coil of the right ureteral stent was grasped and brought to the level of the urethral meatus. The stent was then cannulated using a sensor wire up to level of the kidney. The wire was snapped in place after the stent was removed. A semirigid ureteroscope was then brought in and advanced through the valve into the distal ureter where 2 stones were encountered. The ureter appeared relatively dilated and the stone was somewhat small and spherical. It did appear amenable to extraction via basket. This time, a 1.9 [place] nitinol basket was used to grasp the stone and remove it from the distal ureter. This was passed off the field as stone specimen. The scope was reintroduced back into the distal ureter and a smaller stone fragment was identified and removed.  The scope was then advanced all the way up to the level of the UPJ and no additional stone stone fragments or tumors were identified.  Next, attention was turned to removing the bladder tumors and bladder biopsy. Cold  cup biopsy forceps were used to biopsy several erythematous areas on the trigone, posterior wall, lateral walls and dome of bladder. Next, the tumors were  removed and a pea-sized fashion with cold cup biopsy forceps until no gross tumor was remaining. Bugbee electrocautery was used for hemostasis. Once hemostasis was excellent, the scope was removed. A 16 French Foley catheter was then placed and the balloon was filled with 10 cc of sterile water. The patient was then clean and dry, repositioned a transition, reverse and anesthesia maintained the PACU in stable condition  The bladder was instilled with 40 mg of intravesical mitomycin after clamping the catheter. This was allowed to remain indwelling in the bladder for approximately one hour in the PACU. She tolerated this well. The bladder was drained and the catheter was removed prior to her discharge.  Plan: Patient will follow up in 1 week for cystoscopy, stent removal and pathology review.  Hollice Espy, M.D.

## 2016-01-02 NOTE — Discharge Instructions (Signed)
AMBULATORY SURGERY  DISCHARGE INSTRUCTIONS   1) The drugs that you were given will stay in your system until tomorrow so for the next 24 hours you should not:  A) Drive an automobile B) Make any legal decisions C) Drink any alcoholic beverage   2) You may resume regular meals tomorrow.  Today it is better to start with liquids and gradually work up to solid foods.  You may eat anything you prefer, but it is better to start with liquids, then soup and crackers, and gradually work up to solid foods.   3) Please notify your doctor immediately if you have any unusual bleeding, trouble breathing, redness and pain at the surgery site, drainage, fever, or pain not relieved by medication.    4) Additional Instructions:        Please contact your physician with any problems or Same Day Surgery at [phone], Monday through Friday 6 am to 4 pm, or [place] at [facility] number at [phone].  Transurethral Resection of Bladder Tumor (TURBT) or Bladder Biopsy   Definition:  Transurethral Resection of the Bladder Tumor is a surgical procedure used to diagnose and remove tumors within the bladder. TURBT is the most common treatment for early stage bladder cancer.  General instructions:     Your recent bladder surgery requires very little post hospital care but some definite precautions.  Despite the fact that no skin incisions were used, the area around the bladder incisions are raw and covered with scabs to promote healing and prevent bleeding. Certain precautions are needed to insure that the scabs are not disturbed over the next 2-4 weeks while the healing proceeds.  Because the raw surface inside your bladder and the irritating effects of urine you may expect frequency of urination and/or urgency (a stronger desire to urinate) and perhaps even getting up at night more often. This will usually resolve or improve slowly over the healing period. You may see some blood in your  urine over the first 6 weeks. Do not be alarmed, even if the urine was clear for a while. Get off your feet and drink lots of fluids until clearing occurs. If you start to pass clots or don't improve call us.  Diet:  You may return to your normal diet immediately. Because of the raw surface of your bladder, alcohol, spicy foods, foods high in acid and drinks with caffeine may cause irritation or frequency and should be used in moderation. To keep your urine flowing freely and avoid constipation, drink plenty of fluids during the day (8-10 glasses). Tip: Avoid cranberry juice because it is very acidic.  Activity:  Your physical activity doesn't need to be restricted. However, if you are very active, you may see some blood in the urine. We suggest that you reduce your activity under the circumstances until the bleeding has stopped.  Bowels:  It is important to keep your bowels regular during the postoperative period. Straining with bowel movements can cause bleeding. A bowel movement every other day is reasonable. Use a mild laxative if needed, such as milk of magnesia 2-3 tablespoons, or 2 Dulcolax tablets. Call if you continue to have problems. If you had been taking narcotics for pain, before, during or after your surgery, you may be constipated. Take a laxative if necessary.    Medication:  You should resume your pre-surgery medications unless told not to. In addition you may be given an antibiotic to prevent or treat infection. Antibiotics are not always necessary. All  medication should be taken as prescribed until the bottles are finished unless you are having an unusual reaction to one of the drugs.   [facility] [address], [place], [place] [address] [phone]

## 2016-01-03 ENCOUNTER — Telehealth: Payer: Self-pay

## 2016-01-03 NOTE — Telephone Encounter (Signed)
Pt called stating she had kidney stone surgery yesterday and was under the impression she was supposed to go home with abx, but there was no script for one. Per Dr. Erlene Quan pt does not need abx post surgery. Pt voiced understanding.

## 2016-01-04 ENCOUNTER — Encounter: Payer: Self-pay | Admitting: Urology

## 2016-01-04 LAB — SURGICAL PATHOLOGY

## 2016-01-08 ENCOUNTER — Ambulatory Visit (INDEPENDENT_AMBULATORY_CARE_PROVIDER_SITE_OTHER): Payer: Medicare Other | Admitting: Urology

## 2016-01-08 ENCOUNTER — Other Ambulatory Visit: Payer: Medicare Other | Admitting: Urology

## 2016-01-08 VITALS — BP 147/79 | HR 65 | Ht 62.0 in | Wt 197.0 lb

## 2016-01-08 DIAGNOSIS — D494 Neoplasm of unspecified behavior of bladder: Secondary | ICD-10-CM

## 2016-01-08 DIAGNOSIS — N200 Calculus of kidney: Secondary | ICD-10-CM | POA: Diagnosis not present

## 2016-01-08 LAB — URINALYSIS, COMPLETE
Bilirubin, UA: NEGATIVE
Glucose, UA: NEGATIVE
Ketones, UA: NEGATIVE
Nitrite, UA: NEGATIVE
PH UA: 7 (ref 5.0–7.5)
Protein, UA: NEGATIVE
Specific Gravity, UA: 1.015 (ref 1.005–1.030)
Urobilinogen, Ur: 0.2 mg/dL (ref 0.2–1.0)

## 2016-01-08 LAB — MICROSCOPIC EXAMINATION
BACTERIA UA: NONE SEEN
RBC, UA: >30 /hpf — AB (ref 0–?)
WBC, UA: >30 /hpf — AB (ref 0–?)

## 2016-01-08 MED ORDER — CIPROFLOXACIN HCL 500 MG PO TABS
500.0000 mg | ORAL_TABLET | Freq: Once | ORAL | Status: AC
  Administered 2016-01-08: 500 mg via ORAL

## 2016-01-08 MED ORDER — LIDOCAINE HCL 2 % EX GEL
1.0000 "application " | Freq: Once | CUTANEOUS | Status: AC
  Administered 2016-01-08: 1 via URETHRAL

## 2016-01-08 NOTE — Progress Notes (Signed)
01/08/16  Cystoscopy/ Stent removal procedure  Patient identification was confirmed, informed consent was obtained, and patient was prepped using Betadine solution.  Lidocaine jelly was administered per urethral meatus.    Preoperative abx where received prior to procedure.    Procedure: - Flexible cystoscope introduced, without any difficulty.   - Thorough search of the bladder revealed:    normal urethral meatus  Stent seen emanating from right ureteral orifice, grasped with stent graspers, and removed in entirety.    Biopsy sites appreciated, fulguration base noted.  Post-Procedure: - Patient tolerated the procedure well  Hollice Espy, MD

## 2016-01-08 NOTE — Progress Notes (Signed)
01/08/2016 2:02 PM   Waymon Budge [id number]  Referring provider: Adin Hector, MD [place], [place] [address]  Chief Complaint  Patient presents with  . Cysto Stent Removal    HPI: 75 year old female admitted on 12/03/2015 with fevers, right flank pain found to have an obstructing right distal 5 mm ureteral stone. She was taken to the operating room for cystoscopy, right ureteral stent placement in the setting of worsening leukocytosis and uncontrolled pain.  Intraoperatively, the bladder tumor was identified.      Her hospital course was complicated by AKI, acute respiratory distress requiring ICU admission, Afib with RVR, and delirium.  Her blood culture ultimately grew pansensitive Escherichia coli.  She returned to the operating room on 01/02/2016 for definitive management of her stone via ureteroscopy, laser lithotripsy followed by TURBT. 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  She does have a personal history of kidney stones and passed one approximately 10 years ago. She previously saw Dr. Jacqlyn Larsen.  She does not drink much water. She also uses excessive amount of salt per self-report.  PMH: Past Medical History:  Diagnosis Date  . Arthritis   . Asthma   . Back pain   . Breast cancer ([place]) 2013   left breast ductal and lobular carcinoma  . Dysrhythmia 12/2015   A-fib  . Family history of adverse reaction to anesthesia    dad had hard time waking up from anesthesia  . GERD (gastroesophageal reflux disease)   . Hyperlipidemia   . Hypertension   . Hypothyroidism   . Kidney stones 12/2015  . Shingles   . Skin cancer 2005   left arm and nose  . Sleep apnea     Surgical History: Past Surgical History:  Procedure Laterality Date  . BREAST LUMPECTOMY Left 2013   with rad tx  . CARPAL TUNNEL RELEASE    .  CATARACT EXTRACTION    . COLONOSCOPY WITH PROPOFOL N/A 05/10/2015   Procedure: COLONOSCOPY WITH PROPOFOL;  Surgeon: Hulen Luster, MD;  Location: [facility] ENDOSCOPY;  Service: Gastroenterology;  Laterality: N/A;  . CYSTOSCOPY W/ RETROGRADES Bilateral 01/02/2016   Procedure: CYSTOSCOPY WITH RETROGRADE PYELOGRAM;  Surgeon: Hollice Espy, MD;  Location: [facility] ORS;  Service: Urology;  Laterality: Bilateral;  . CYSTOSCOPY W/ URETERAL STENT PLACEMENT Right 01/02/2016   Procedure: CYSTOSCOPY WITH STENT REPLACEMENT;  Surgeon: Hollice Espy, MD;  Location: [facility] ORS;  Service: Urology;  Laterality: Right;  . CYSTOSCOPY WITH STENT PLACEMENT Right 12/04/2015   Procedure: CYSTOSCOPY WITH STENT PLACEMENT;  Surgeon: Hollice Espy, MD;  Location: [facility] ORS;  Service: Urology;  Laterality: Right;  . JOINT REPLACEMENT Left 2013   hip  . KNEE ARTHROSCOPY    . MASTECTOMY    . SEPTOPLASTY    . TRANSURETHRAL RESECTION OF BLADDER TUMOR WITH MITOMYCIN-C N/A 01/02/2016   Procedure: TRANSURETHRAL RESECTION OF BLADDER TUMOR WITH MITOMYCIN-C;  Surgeon: Hollice Espy, MD;  Location: [facility] ORS;  Service: Urology;  Laterality: N/A;  . URETEROSCOPY WITH HOLMIUM LASER LITHOTRIPSY Right 01/02/2016   Procedure: URETEROSCOPY WITH HOLMIUM LASER LITHOTRIPSY;  Surgeon: Hollice Espy, MD;  Location: [facility] ORS;  Service: Urology;  Laterality: Right;  . UVULOPALATOPHARYNGOPLASTY     and tongue surgery    Home Medications:    Medication List       Accurate as of 01/08/16  2:02 PM. Always use your most recent med list.  amLODipine 5 MG tablet Commonly known as:  NORVASC Take 5 mg by mouth every morning.   apixaban 5 MG Tabs tablet Commonly known as:  ELIQUIS Take 1 tablet (5 mg total) by mouth 2 (two) times daily.   calcium carbonate 1500 (600 Ca) MG Tabs tablet Commonly known as:  OSCAL Take 1,500 mg by mouth 2 (two) times daily with a meal.   DORZOLAMIDE HCL-TIMOLOL MAL OP Place 1 drop into both eyes 2 (two) times  daily.   exemestane 25 MG tablet Commonly known as:  AROMASIN Take 1 tablet (25 mg total) by mouth daily after breakfast.   furosemide 20 MG tablet Commonly known as:  LASIX Take 20 mg by mouth daily as needed.   hydrochlorothiazide 25 MG tablet Commonly known as:  HYDRODIURIL Take 25 mg by mouth daily.   ibuprofen 200 MG tablet Commonly known as:  ADVIL,MOTRIN Take 400 mg by mouth every 6 (six) hours as needed.   levothyroxine 75 MCG tablet Commonly known as:  SYNTHROID, LEVOTHROID Take 75 mcg by mouth daily before breakfast.   loratadine 10 MG tablet Commonly known as:  CLARITIN Take 10 mg by mouth daily.   losartan 100 MG tablet Commonly known as:  COZAAR Take 100 mg by mouth every morning.   lovastatin 40 MG tablet Commonly known as:  MEVACOR Take 40 mg by mouth at bedtime.   LUMIGAN 0.01 % Soln Generic drug:  bimatoprost Place 1 drop into both eyes at bedtime.   metoprolol succinate 100 MG 24 hr tablet Commonly known as:  TOPROL-XL Take 100 mg by mouth 2 (two) times daily. Take with or immediately following a meal.   multivitamin tablet Take 1 tablet by mouth daily.   pantoprazole 40 MG tablet Commonly known as:  PROTONIX Take 40 mg by mouth daily.   potassium chloride SA 20 MEQ tablet Commonly known as:  K-DUR,KLOR-CON Take 20 mEq by mouth 2 (two) times daily.   tamsulosin 0.4 MG Caps capsule Commonly known as:  FLOMAX Take 1 capsule (0.4 mg total) by mouth daily.       Allergies:  Allergies  Allergen Reactions  . Ativan [Lorazepam]     Hysteria   . Cardura [Doxazosin] Other (See Comments)    unsure  . Clonidine Derivatives Other (See Comments)    unsure  . Enalapril Other (See Comments)    unsure  . Levaquin [Levofloxacin] Other (See Comments)    Couldn't raise her arms  . Mobic [Meloxicam] Other (See Comments)    unsure  . Sulfa Antibiotics Nausea Only  . Vicodin [Hydrocodone-Acetaminophen] Other (See Comments)    unsure     Family History: Family History  Problem Relation Age of Onset  . Breast cancer Mother 72  . Kidney cancer Father   . Kidney Stones Brother   . Prostate cancer Neg Hx     Social History:  reports that she has never smoked. She has never used smokeless tobacco. She reports that she does not drink alcohol or use drugs.     Physical Exam: BP (!) 147/79   Pulse 65   Ht 5\' 2"  (1.575 m)   Wt 197 lb (89.4 kg)   BMI 36.03 kg/m   Constitutional:  Alert and oriented, No acute distress.  Accompanied today by her husband and daughter. HEENT: [place] AT, moist mucus membranes.  Trachea midline, no masses. Cardiovascular: No clubbing, cyanosis, or edema.  Irregular rhythm. Respiratory: Normal respiratory effort, no increased work of breathing.  GI: Abdomen is soft, nontender, nondistended, no abdominal masses GU: normal urethral meatus..  Skin: No rashes, bruises or suspicious lesions. Neurologic: Grossly intact, no focal deficits, moving all 4 extremities. Psychiatric: Normal mood and affect.  Laboratory Data: Lab Results  Component Value Date   WBC 14.1 (H) 12/08/2015   HGB 13.2 12/08/2015   HCT 37.8 12/08/2015   MCV 88.3 12/08/2015   PLT 128 (L) 12/08/2015    Lab Results  Component Value Date   CREATININE 0.96 12/09/2015    See attached procedure report   Assessment & Plan:   1. Right ureteral stone S/p ureteroscopy with extraction of stone Stent removed today in the office without difficulty Stone diet reviewed, handout given  Follow-up in 4 weeks with renal ultrasound- will call with results  2. Bladder tumor LgTa TCC, ~1 cm Pathology reviewed today with patient Recommend continued bladder surveillance with every 3 month cystoscopy   Return in about 3 months (around 04/09/2016) for cysto.   Hollice Espy, MD  [facility] [address] [place], [place] [address] [phone]

## 2016-01-14 LAB — STONE ANALYSIS
CA OXALATE, DIHYDRATE: 5 %
Ca Oxalate,Monohydr.: 75 %
Ca phos cry stone ql IR: 20 %
STONE WEIGHT KSTONE: 92 mg

## 2016-02-08 ENCOUNTER — Ambulatory Visit
Admission: RE | Admit: 2016-02-08 | Discharge: 2016-02-08 | Disposition: A | Discharge status: Home / Self Care | Payer: Medicare Other | Source: Ambulatory Visit | Attending: Urology | Admitting: Urology

## 2016-02-08 DIAGNOSIS — N200 Calculus of kidney: Secondary | ICD-10-CM | POA: Diagnosis not present

## 2016-02-11 ENCOUNTER — Telehealth: Payer: Self-pay

## 2016-02-11 NOTE — Telephone Encounter (Signed)
-----   Message from Hollice Espy, MD sent at 02/08/2016  4:38 PM EDT ----- Renal ultrasound looks great. There is no evidence of residual swelling from your stone.  Hollice Espy, MD

## 2016-02-11 NOTE — Telephone Encounter (Signed)
LMOM- RUS looks great. No evidence of residual swelling.

## 2016-02-13 DIAGNOSIS — Z8551 Personal history of malignant neoplasm of bladder: Secondary | ICD-10-CM | POA: Insufficient documentation

## 2016-02-15 ENCOUNTER — Encounter: Payer: Self-pay | Admitting: Cardiovascular Disease

## 2016-02-15 ENCOUNTER — Ambulatory Visit (INDEPENDENT_AMBULATORY_CARE_PROVIDER_SITE_OTHER): Payer: Medicare Other | Admitting: Cardiovascular Disease

## 2016-02-15 VITALS — BP 140/60 | Ht 62.0 in | Wt 198.8 lb

## 2016-02-15 DIAGNOSIS — I10 Essential (primary) hypertension: Secondary | ICD-10-CM

## 2016-02-15 DIAGNOSIS — I480 Paroxysmal atrial fibrillation: Secondary | ICD-10-CM | POA: Diagnosis not present

## 2016-02-15 MED ORDER — ASPIRIN EC 81 MG PO TBEC: 81.0000 mg | DELAYED_RELEASE_TABLET | Freq: Every day | ORAL | 3 refills | Status: AC

## 2016-02-15 NOTE — Patient Instructions (Signed)
Medication Instructions:  Your physician has recommended you make the following change in your medication:  STOP taking eliquis START taking 81mg  aspirin once daily   Labwork: none  Testing/Procedures: none  Follow-Up: Your physician wants you to follow-up in: 6 months with Dr. Fletcher Anon.  You will receive a reminder letter in the mail two months in advance. If you don't receive a letter, please call our office to schedule the follow-up appointment.   Any Other Special Instructions Will Be Listed Below (If Applicable).     If you need a refill on your cardiac medications before your next appointment, please call your pharmacy.

## 2016-02-15 NOTE — Progress Notes (Signed)
Cardiology Office Note   Date:  02/15/2016   ID:  Alyssa Duncan, DOB Sep 19, 1940, MRN [id number]  PCP:  Adin Hector, MD  Cardiologist:   Kathlyn Sacramento, MD   Chief Complaint  Patient presents with  . other    2 month follow up. Meds reviewed by the pt. verbally. "doing well."       History of Present Illness: Alyssa Duncan is a 75 y.o. female who presents for a follow-up visit for paroxysmal atrial fibrillation. She has no previous cardiac history. She Had atrial fibrillation with RVR in the setting of sepsis and bacteremia due to obstructive urethral stones.  She had an echocardiogram done which showed normal LV systolic function, mild mitral and aortic regurgitation and normal atrial size. Pulmonary pressure was normal. During last visit, I discontinued amiodarone with no evidence of recurrent atrial fibrillation. She was having dry cough and shortness of breath that actually resolved after stopping the medication. She is doing well overall.   Past Medical History:  Diagnosis Date  . Arthritis   . Asthma   . Back pain   . Breast cancer ([place]) 2013   left breast ductal and lobular carcinoma  . Dysrhythmia 12/2015   A-fib  . Family history of adverse reaction to anesthesia    dad had hard time waking up from anesthesia  . GERD (gastroesophageal reflux disease)   . Hyperlipidemia   . Hypertension   . Hypothyroidism   . Kidney stones 12/2015  . Shingles   . Skin cancer 2005   left arm and nose  . Sleep apnea     Past Surgical History:  Procedure Laterality Date  . BREAST LUMPECTOMY Left 2013   with rad tx  . CARPAL TUNNEL RELEASE    . CATARACT EXTRACTION    . COLONOSCOPY WITH PROPOFOL N/A 05/10/2015   Procedure: COLONOSCOPY WITH PROPOFOL;  Surgeon: Hulen Luster, MD;  [facility] ENDOSCOPY;  Service: Gastroenterology;  Laterality: N/A;  . CYSTOSCOPY W/ RETROGRADES Bilateral 01/02/2016   Procedure: CYSTOSCOPY WITH RETROGRADE PYELOGRAM;  Surgeon: Hollice Espy,  MD;  Location: [facility] ORS;  Service: Urology;  Laterality: Bilateral;  . CYSTOSCOPY W/ URETERAL STENT PLACEMENT Right 01/02/2016   Procedure: CYSTOSCOPY WITH STENT REPLACEMENT;  Surgeon: Hollice Espy, MD;  Location: [facility] ORS;  Service: Urology;  Laterality: Right;  . CYSTOSCOPY WITH STENT PLACEMENT Right 12/04/2015   Procedure: CYSTOSCOPY WITH STENT PLACEMENT;  Surgeon: Hollice Espy, MD;  Location: [facility] ORS;  Service: Urology;  Laterality: Right;  . JOINT REPLACEMENT Left 2013   hip  . KNEE ARTHROSCOPY    . MASTECTOMY    . SEPTOPLASTY    . TRANSURETHRAL RESECTION OF BLADDER TUMOR WITH MITOMYCIN-C N/A 01/02/2016   Procedure: TRANSURETHRAL RESECTION OF BLADDER TUMOR WITH MITOMYCIN-C;  Surgeon: Hollice Espy, MD;  Location: [facility] ORS;  Service: Urology;  Laterality: N/A;  . URETEROSCOPY WITH HOLMIUM LASER LITHOTRIPSY Right 01/02/2016   Procedure: URETEROSCOPY WITH HOLMIUM LASER LITHOTRIPSY;  Surgeon: Hollice Espy, MD;  Location: [facility] ORS;  Service: Urology;  Laterality: Right;  . UVULOPALATOPHARYNGOPLASTY     and tongue surgery     Current Outpatient Prescriptions  Medication Sig Dispense Refill  . amLODipine (NORVASC) 5 MG tablet Take 5 mg by mouth every morning.     Marland Kitchen apixaban (ELIQUIS) 5 MG TABS tablet Take 1 tablet (5 mg total) by mouth 2 (two) times daily. 60 tablet 1  . calcium carbonate (OSCAL) 1500 (600 Ca) MG TABS tablet Take  1,500 mg by mouth 2 (two) times daily with a meal.    . DORZOLAMIDE HCL-TIMOLOL MAL OP Place 1 drop into both eyes 2 (two) times daily.    Marland Kitchen exemestane (AROMASIN) 25 MG tablet Take 1 tablet (25 mg total) by mouth daily after breakfast. 30 tablet 11  . furosemide (LASIX) 20 MG tablet Take 20 mg by mouth daily as needed.    . hydrochlorothiazide (HYDRODIURIL) 25 MG tablet Take 25 mg by mouth daily.    Marland Kitchen ibuprofen (ADVIL,MOTRIN) 200 MG tablet Take 400 mg by mouth every 6 (six) hours as needed.     Marland Kitchen levothyroxine (SYNTHROID, LEVOTHROID) 75 MCG tablet Take 75 mcg  by mouth daily before breakfast.    . loratadine (CLARITIN) 10 MG tablet Take 10 mg by mouth daily.    Marland Kitchen losartan (COZAAR) 100 MG tablet Take 100 mg by mouth every morning.     . lovastatin (MEVACOR) 40 MG tablet Take 40 mg by mouth at bedtime.    Marland Kitchen LUMIGAN 0.01 % SOLN Place 1 drop into both eyes at bedtime.     . metoprolol succinate (TOPROL-XL) 100 MG 24 hr tablet Take 100 mg by mouth 2 (two) times daily. Take with or immediately following a meal.    . Multiple Vitamin (MULTIVITAMIN) tablet Take 1 tablet by mouth daily.    . pantoprazole (PROTONIX) 40 MG tablet Take 40 mg by mouth daily.    . potassium chloride SA (K-DUR,KLOR-CON) 20 MEQ tablet Take 20 mEq by mouth 2 (two) times daily.     No current facility-administered medications for this visit.     Allergies:   Ativan [lorazepam]; Cardura [doxazosin]; Clonidine derivatives; Enalapril; Levaquin [levofloxacin]; Mobic [meloxicam]; Sulfa antibiotics; and Vicodin [hydrocodone-acetaminophen]    Social History:  The patient  reports that she has never smoked. She has never used smokeless tobacco. She reports that she does not drink alcohol or use drugs.   Family History:  The patient's family history includes Breast cancer (age of onset: 41) in her mother; Kidney Stones in her brother; Kidney cancer in her father.    ROS:  Please see the history of present illness.   Otherwise, review of systems are positive for none.   All other systems are reviewed and negative.    PHYSICAL EXAM: VS:  BP 140/60 (BP Location: Left Arm, Patient Position: Sitting, Cuff Size: Large)   Ht 5\' 2"  (1.575 m)   Wt 198 lb 12 oz (90.2 kg)   BMI 36.35 kg/m  , BMI Body mass index is 36.35 kg/m. GEN: Well nourished, well developed, in no acute distress  HEENT: normal  Neck: no JVD, carotid bruits, or masses Cardiac: RRR; no murmurs, rubs, or gallops,no edema  Respiratory:  clear to auscultation bilaterally, normal work of breathing GI: soft, nontender,  nondistended, + BS MS: no deformity or atrophy  Skin: warm and dry, no rash Neuro:  Strength and sensation are intact Psych: euthymic mood, full affect   EKG:  EKG is ordered today. The ekg ordered today demonstrates  normal sinus rhythm with nonspecific ST changes.  Recent Labs: 12/03/2015: ALT 31 12/06/2015: Magnesium 1.9; TSH 2.910 12/08/2015: Hemoglobin 13.2; Platelets 128 12/09/2015: BUN 21; Creatinine, Ser 0.96; Potassium 3.6; Sodium 139    Lipid Panel No results found for: CHOL, TRIG, HDL, CHOLHDL, VLDL, LDLCALC, LDLDIRECT    Wt Readings from Last 3 Encounters:  02/15/16 198 lb 12 oz (90.2 kg)  01/08/16 197 lb (89.4 kg)  01/02/16 197 lb (89.4  kg)       No flowsheet data found.    ASSESSMENT AND PLAN:  1.  Paroxysmal atrial fibrillation: The patient had atrial fibrillation in the setting of sepsis and respiratory distress . It is reassuring that echocardiogram showed no evidence of structural heart abnormalities or atrial enlargement. She had no recurrent atrial fibrillation after stopping amiodarone. Thus, I think it's reasonable to discontinue anticoagulation for now. If she develops any atrial fibrillation in the future, she should be on lifelong anticoagulation.  2. Cough and dyspnea:  Her symptoms improved after stopping amiodarone.  3. Essential hypertension: Blood pressure is controlled on medications.    Disposition:   FU with me in 2 months  Signed,  Kathlyn Sacramento, MD  02/15/2016 11:30 AM    [place]

## 2016-03-24 ENCOUNTER — Ambulatory Visit
Admission: RE | Admit: 2016-03-24 | Discharge: 2016-03-24 | Disposition: A | Discharge status: Home / Self Care | Payer: Medicare Other | Source: Ambulatory Visit | Attending: Oncology | Admitting: Oncology

## 2016-03-24 ENCOUNTER — Other Ambulatory Visit: Payer: Self-pay | Admitting: Oncology

## 2016-03-24 DIAGNOSIS — C50919 Malignant neoplasm of unspecified site of unspecified female breast: Secondary | ICD-10-CM

## 2016-03-24 HISTORY — DX: Personal history of irradiation: Z92.3

## 2016-04-07 HISTORY — PX: BREAST BIOPSY: SHX20

## 2016-04-09 ENCOUNTER — Ambulatory Visit: Payer: Medicare Other | Admitting: Urology

## 2016-04-11 ENCOUNTER — Other Ambulatory Visit: Payer: Medicare Other | Admitting: Urology

## 2016-04-24 ENCOUNTER — Other Ambulatory Visit: Payer: Medicare Other | Admitting: Urology

## 2016-05-01 ENCOUNTER — Encounter: Payer: Self-pay | Admitting: Urology

## 2016-05-01 ENCOUNTER — Ambulatory Visit: Payer: Medicare Other | Admitting: Urology

## 2016-05-01 VITALS — Ht 62.0 in | Wt 203.0 lb

## 2016-05-01 DIAGNOSIS — C673 Malignant neoplasm of anterior wall of bladder: Secondary | ICD-10-CM | POA: Diagnosis not present

## 2016-05-01 DIAGNOSIS — Z87442 Personal history of urinary calculi: Secondary | ICD-10-CM

## 2016-05-01 LAB — URINALYSIS, COMPLETE
BILIRUBIN UA: NEGATIVE
Glucose, UA: NEGATIVE
Ketones, UA: NEGATIVE
Nitrite, UA: NEGATIVE
PH UA: 7 (ref 5.0–7.5)
Protein, UA: NEGATIVE
Specific Gravity, UA: 1.015 (ref 1.005–1.030)
Urobilinogen, Ur: 0.2 mg/dL (ref 0.2–1.0)

## 2016-05-01 LAB — MICROSCOPIC EXAMINATION: BACTERIA UA: NONE SEEN

## 2016-05-01 MED ORDER — LIDOCAINE HCL 2 % EX GEL
1.0000 "application " | Freq: Once | CUTANEOUS | Status: AC
  Administered 2016-05-01: 1 via URETHRAL

## 2016-05-01 MED ORDER — CIPROFLOXACIN HCL 500 MG PO TABS
500.0000 mg | ORAL_TABLET | Freq: Once | ORAL | Status: AC
  Administered 2016-05-01: 500 mg via ORAL

## 2016-05-01 NOTE — Progress Notes (Signed)
   05/01/16  CC:  Chief Complaint  Patient presents with  . Cysto    HPI: 76 year old female admitted on 12/03/2015 with fevers, right flank pain found to have an obstructing right distal 5 mm ureteral stone. She was taken to the operating room for cystoscopy, right ureteral stent placement in the setting of worsening leukocytosis and uncontrolled pain.  Intraoperatively, the bladder tumor was identified.    She returned to the operating room on 01/02/2016 for definitive management of her stone via ureteroscopy, laser lithotripsy followed by TURBT. 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  Follow up renal ultrasound negative for any residual hydronephrosis or stones.  She does have a personal history of kidney stones and passed one approximately 10 years ago. She previously saw Dr. Jacqlyn Larsen.  She returns to the office today for cystoscopy per surveillance protocol.  Height 5\' 2"  (1.575 m), weight 203 lb (92.1 kg). NED. A&Ox3.   No respiratory distress   Abd soft, NT, ND Normal external genitalia with patent urethral meatus  Cystoscopy Procedure Note  Patient identification was confirmed, informed consent was obtained, and patient was prepped using Betadine solution.  Lidocaine jelly was administered per urethral meatus.    Preoperative abx where received prior to procedure.    Procedure: - Flexible cystoscope introduced, without any difficulty.   - Thorough search of the bladder revealed:    normal urethral meatus    normal urothelium    no stones    no ulcers     no urethral polyps    no trabeculation  - Ureteral orifices were normal in position and appearance.  Upon retroflexion, there was an approximately 0.2 mm papillary lesion on a fine stalk on the anterior wall of the bladder just adjacent to the bladder neck. This was difficult to appreciate and any other position other  than retroflexion using the flexible scope.  Post-Procedure: - Patient tolerated the procedure well  Assessment/ Plan:  1. 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 Options including returned to the OR for repeat TURBT versus office fulguration were discussed given her history of low-grade noninvasive disease. She is most interested in office fulguration. Risks and benefits were discussed. - Urinalysis, Complete - ciprofloxacin (CIPRO) tablet 500 mg; Take 1 tablet (500 mg total) by mouth once. - lidocaine (XYLOCAINE) 2 % jelly 1 application; Place 1 application into the urethra once.  2. History of kidney stones Currently asymptomatic.  RUS negative 02/08/16 negative for residual hydro or stones  Return for office fulgeration ASAP.\   Hollice Espy, MD

## 2016-05-06 ENCOUNTER — Ambulatory Visit: Payer: Medicare Other | Admitting: Urology

## 2016-05-06 ENCOUNTER — Encounter: Payer: Self-pay | Admitting: Urology

## 2016-05-06 VITALS — BP 189/45 | HR 71 | Ht 62.0 in | Wt 203.0 lb

## 2016-05-06 DIAGNOSIS — C673 Malignant neoplasm of anterior wall of bladder: Secondary | ICD-10-CM

## 2016-05-06 MED ORDER — LIDOCAINE HCL 2 % IJ SOLN
50.0000 mL | Freq: Once | INTRAMUSCULAR | Status: AC
  Administered 2016-05-06: 1000 mg

## 2016-05-06 MED ORDER — CIPROFLOXACIN HCL 500 MG PO TABS
500.0000 mg | ORAL_TABLET | Freq: Once | ORAL | Status: AC
  Administered 2016-05-06: 500 mg via ORAL

## 2016-05-06 MED ORDER — LIDOCAINE HCL 2 % EX GEL
1.0000 "application " | Freq: Once | CUTANEOUS | Status: AC
  Administered 2016-05-06: 1 via URETHRAL

## 2016-05-06 NOTE — Progress Notes (Signed)
   05/06/16  CC:  Chief Complaint  Patient presents with  . Cysto    Bladder Fulgeration    HPI: 76 year old female admitted on 12/03/2015 with fevers, right flank pain found to have an obstructing right distal 5 mm ureteral stone. She was taken to the operating room for cystoscopy, right ureteral stent placement in the setting of worsening leukocytosis and uncontrolled pain.  Intraoperatively, the bladder tumor was identified.    She returned to the operating room on 01/02/2016 for definitive management of her stone via ureteroscopy, laser lithotripsy followed by TURBT. 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  Follow up renal ultrasound negative for any residual hydronephrosis or stones.  She does have a personal history of kidney stones and passed one approximately 10 years ago. She previously saw Dr. Jacqlyn Larsen.  Most recent cystoscopy on 05/01/2016 showed a very small low-grade appearing recurrence ~2 mm, anterior bladder wall in close proximity to the bladder neck.  She returns to the office today for cystoscopy, fulguration of this lesion.  Height 5\' 2"  (1.575 m), weight 203 lb (92.1 kg). NED. A&Ox3.   No respiratory distress   Abd soft, NT, ND Normal external genitalia with patent urethral meatus  Cystoscopy/ Bladder lesion fulgeration procedure Note  Patient identification was confirmed, informed consent was obtained, and patient was prepped using Betadine solution.  Lidocaine jelly was instilled into the bladder and allowed to dwell for 30 minutes.  Preoperative abx where received prior to procedure.    Procedure: - Flexible cystoscope introduced, without any difficulty.   - Thorough search of the bladder revealed:    normal urethral meatus    normal urothelium    no stones    no ulcers     no urethral polyps    no trabeculation  - Ureteral orifices were normal in  position and appearance.  Upon retroflexion, there was an approximately 0.2 mm papillary lesion on a fine stalk on the anterior wall of the bladder just adjacent to the bladder neck. This was difficult to appreciate and any other position other than retroflexion using the flexible scope.  At this time,  Bugbee electrocautery at the setting of 40 was used to fulgurate the tumor. In order to reach the tumor, intravaginal digital pressure was applied to the bladder neck to reduce her prolapse.  She tolerated fulguration very well. At the end of the procedure, no additional fronds were identified in the base of the tumor was adequately blanched.  Post-Procedure: - Patient tolerated the procedure well  Assessment/ Plan:  1. 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 - lidocaine (XYLOCAINE) 2 % jelly 1 application; Place 1 application into the urethra once.  2. History of kidney stones Currently asymptomatic.  RUS negative 02/08/16 negative for residual hydro or stones  Return in about 3 months (around 08/04/2016) for cystoscopy.\   Hollice Espy, MD

## 2016-05-28 ENCOUNTER — Other Ambulatory Visit: Payer: Self-pay | Admitting: Surgery

## 2016-05-28 DIAGNOSIS — N63 Unspecified lump in unspecified breast: Secondary | ICD-10-CM

## 2016-06-09 ENCOUNTER — Ambulatory Visit
Admission: RE | Admit: 2016-06-09 | Discharge: 2016-06-09 | Disposition: A | Discharge status: Home / Self Care | Payer: Medicare Other | Source: Ambulatory Visit | Attending: Surgery | Admitting: Surgery

## 2016-06-09 DIAGNOSIS — N63 Unspecified lump in unspecified breast: Secondary | ICD-10-CM

## 2016-06-09 DIAGNOSIS — N6320 Unspecified lump in the left breast, unspecified quadrant: Secondary | ICD-10-CM | POA: Diagnosis not present

## 2016-06-11 ENCOUNTER — Other Ambulatory Visit: Payer: Self-pay | Admitting: *Deleted

## 2016-06-11 ENCOUNTER — Other Ambulatory Visit: Payer: Self-pay | Admitting: Surgery

## 2016-06-11 DIAGNOSIS — N6320 Unspecified lump in the left breast, unspecified quadrant: Secondary | ICD-10-CM

## 2016-06-11 DIAGNOSIS — R928 Other abnormal and inconclusive findings on diagnostic imaging of breast: Secondary | ICD-10-CM

## 2016-06-11 DIAGNOSIS — Z853 Personal history of malignant neoplasm of breast: Secondary | ICD-10-CM

## 2016-06-12 ENCOUNTER — Inpatient Hospital Stay: Payer: Medicare Other | Attending: Hematology and Oncology | Admitting: Hematology and Oncology

## 2016-06-12 ENCOUNTER — Inpatient Hospital Stay (HOSPITAL_BASED_OUTPATIENT_CLINIC_OR_DEPARTMENT_OTHER): Payer: Medicare Other

## 2016-06-12 VITALS — BP 150/73 | HR 69 | Temp 98.6°F | Resp 18 | Wt 206.1 lb

## 2016-06-12 DIAGNOSIS — J45909 Unspecified asthma, uncomplicated: Secondary | ICD-10-CM | POA: Diagnosis not present

## 2016-06-12 DIAGNOSIS — Z803 Family history of malignant neoplasm of breast: Secondary | ICD-10-CM | POA: Diagnosis not present

## 2016-06-12 DIAGNOSIS — M549 Dorsalgia, unspecified: Secondary | ICD-10-CM

## 2016-06-12 DIAGNOSIS — Z85828 Personal history of other malignant neoplasm of skin: Secondary | ICD-10-CM | POA: Insufficient documentation

## 2016-06-12 DIAGNOSIS — Z923 Personal history of irradiation: Secondary | ICD-10-CM | POA: Diagnosis not present

## 2016-06-12 DIAGNOSIS — Z79899 Other long term (current) drug therapy: Secondary | ICD-10-CM

## 2016-06-12 DIAGNOSIS — K219 Gastro-esophageal reflux disease without esophagitis: Secondary | ICD-10-CM | POA: Diagnosis not present

## 2016-06-12 DIAGNOSIS — Z7982 Long term (current) use of aspirin: Secondary | ICD-10-CM | POA: Diagnosis not present

## 2016-06-12 DIAGNOSIS — Z79811 Long term (current) use of aromatase inhibitors: Secondary | ICD-10-CM | POA: Insufficient documentation

## 2016-06-12 DIAGNOSIS — G4730 Sleep apnea, unspecified: Secondary | ICD-10-CM | POA: Diagnosis not present

## 2016-06-12 DIAGNOSIS — E785 Hyperlipidemia, unspecified: Secondary | ICD-10-CM | POA: Insufficient documentation

## 2016-06-12 DIAGNOSIS — I10 Essential (primary) hypertension: Secondary | ICD-10-CM | POA: Diagnosis not present

## 2016-06-12 DIAGNOSIS — Z170 Estrogen receptor positive status [ER+]: Secondary | ICD-10-CM

## 2016-06-12 DIAGNOSIS — I4891 Unspecified atrial fibrillation: Secondary | ICD-10-CM | POA: Diagnosis not present

## 2016-06-12 DIAGNOSIS — D0512 Intraductal carcinoma in situ of left breast: Secondary | ICD-10-CM

## 2016-06-12 DIAGNOSIS — Z87442 Personal history of urinary calculi: Secondary | ICD-10-CM | POA: Insufficient documentation

## 2016-06-12 DIAGNOSIS — E039 Hypothyroidism, unspecified: Secondary | ICD-10-CM | POA: Insufficient documentation

## 2016-06-12 DIAGNOSIS — Z853 Personal history of malignant neoplasm of breast: Secondary | ICD-10-CM

## 2016-06-12 LAB — COMPREHENSIVE METABOLIC PANEL
ALT: 32 U/L (ref 14–54)
AST: 34 U/L (ref 15–41)
Albumin: 4.3 g/dL (ref 3.5–5.0)
Alkaline Phosphatase: 109 U/L (ref 38–126)
Anion gap: 11 (ref 5–15)
BUN: 19 mg/dL (ref 6–20)
CO2: 31 mmol/L (ref 22–32)
Calcium: 9.4 mg/dL (ref 8.9–10.3)
Chloride: 99 mmol/L — ABNORMAL LOW (ref 101–111)
Creatinine, Ser: 1.09 mg/dL — ABNORMAL HIGH (ref 0.44–1.00)
GFR calc Af Amer: 55 mL/min — ABNORMAL LOW (ref >60)
GFR calc non Af Amer: 48 mL/min — ABNORMAL LOW (ref >60)
Glucose, Bld: 121 mg/dL — ABNORMAL HIGH (ref 65–99)
Potassium: 3.9 mmol/L (ref 3.5–5.1)
Sodium: 141 mmol/L (ref 135–145)
Total Bilirubin: 0.6 mg/dL (ref 0.3–1.2)
Total Protein: 7.9 g/dL (ref 6.5–8.1)

## 2016-06-12 LAB — CBC WITH DIFFERENTIAL/PLATELET
Basophils Absolute: 0.1 10*3/uL (ref 0–0.1)
Basophils Relative: 1 %
Eosinophils Absolute: 0.3 10*3/uL (ref 0–0.7)
Eosinophils Relative: 4 %
HCT: 43 % (ref 35.0–47.0)
Hemoglobin: 14.8 g/dL (ref 12.0–16.0)
Lymphocytes Relative: 20 %
Lymphs Abs: 1.6 10*3/uL (ref 1.0–3.6)
MCH: 31.3 pg (ref 26.0–34.0)
MCHC: 34.5 g/dL (ref 32.0–36.0)
MCV: 90.7 fL (ref 80.0–100.0)
Monocytes Absolute: 0.5 10*3/uL (ref 0.2–0.9)
Monocytes Relative: 6 %
Neutro Abs: 5.4 10*3/uL (ref 1.4–6.5)
Neutrophils Relative %: 69 %
Platelets: 306 10*3/uL (ref 150–440)
RBC: 4.74 MIL/uL (ref 3.80–5.20)
RDW: 14.3 % (ref 11.5–14.5)
WBC: 8 10*3/uL (ref 3.6–11.0)

## 2016-06-12 MED ORDER — EXEMESTANE 25 MG PO TABS: 25.0000 mg | ORAL_TABLET | Freq: Every day | ORAL | 11 refills | Status: DC

## 2016-06-12 NOTE — Progress Notes (Signed)
Patient saw Dr. Rochel Brome who discovered patient has a lump in her left breast at six o'clock position.  This past Friday patient has an [place] on that breast.  This coming Thursday she will have breast biopsy.  States if she is to remain on exemestane, she will need a refill.  Patient states in August 2017 neurologist found a tumor in patient's bladder.  She had intrperitoneal chemo treatment.  Five months later she had a recheck which revealed another small tumor.  That one was cauterized.

## 2016-06-12 NOTE — Progress Notes (Signed)
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  HPI:  She was diagnosed with breast cancer after presenting with an abnormal screening mammogram.  Bilateral mammogram on 02/25/2012 revealed a cluster of microcalcifications in the central retroareolar left breast.   She underwent stereotactic biopsy on 03/15/2012.  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  She underwent wide local excision on 04/05/2012 by Dr. Rochel Brome.  Pathology revealed a 3.6 cm area of grade II ductal carcinoma in situ. Margins were clear but close at 1 mm.  DCIS was ER + (> 90%), PR + (> 90%).  Pathologic stage was TisNx.  She was seen by Dr. Baruch Gouty of radiation oncology on 04/29/2012.  Plan was for 5000 cGy to her left breast and boost scar another 1600 cGy using electron beam based on the close margin.  She completed radiation in mid March 2014.    She was initially treated with Femara, but discontinued secondary to joint pains.  She tried tamoxifen, but discontinued secondary to swelling.  She has been on Aromasin since 11/16/2012.  She is tolerating it well.  The patient was last seen in the medical oncology clinic on 06/13/2015 by Dr. Oliva Bustard.  At that time, she was doing well.  Bilateral diagnostic mammogram on 03/24/2016 revealed no evidence of malignancy.  Bone density on 08/22/2015 was normal with a T-score of -1.0 in the left femur.    Recently, she noted a palpable mass at the lumpectomy site.  Diagnostic left mammogram and ultrasound on 06/09/2016 revealed an irregular hypoechogenicity in the left breast at 6 o'clock 2 cm from the nipple measuring 9 x 7 x 10 mm. This was felt likely to represent post treatment change but underlying malignancy could not be excluded. Sonographic evaluation of the left axilla did not show any  enlarged adenopathy.  Recommendation was for ultrasound guided core biopsy.  She has a history of non-muscle invasive bladder cancer.  She underwent TURBT on 01/02/2016 by Dr. Hollice Espy.  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  Cystoscopy on 05/01/2016 revealed a very small low-grade appearing recurrence.  She underwent cystoscopy and fulguration on 05/06/2016.  Plan is for follow-up cystoscopy on 08/04/2016.  Symptomatically, she feels "normal".  She notes some aches in her knee.  She denies any breast concerns.   Past Medical History:  Diagnosis Date  . Arthritis   . Asthma   . Back pain   . Breast cancer ([facility]) 2013   left breast ductal and lobular carcinoma  . Dysrhythmia 12/2015   A-fib  . Family history of adverse reaction to anesthesia    dad had hard time waking up from anesthesia  . GERD (gastroesophageal reflux disease)   . Hyperlipidemia   . Hypertension   . Hypothyroidism   . Kidney stones 12/2015  . Personal history of radiation therapy 2013   BREAST CA  . Shingles   . Skin cancer 2005   left arm and nose  . Sleep apnea     Past Surgical History:  Procedure Laterality Date  . BREAST BIOPSY Left 2013   POS  . BREAST LUMPECTOMY Left 2013   with rad tx  . CARPAL TUNNEL RELEASE    . CATARACT EXTRACTION    . COLONOSCOPY WITH PROPOFOL N/A 05/10/2015   Procedure: COLONOSCOPY WITH PROPOFOL;  Surgeon: Hulen Luster, MD;  Location: [facility] ENDOSCOPY;  Service: Gastroenterology;  Laterality: N/A;  . CYSTOSCOPY W/ RETROGRADES Bilateral 01/02/2016   Procedure: CYSTOSCOPY WITH RETROGRADE PYELOGRAM;  Surgeon: Hollice Espy, MD;  Location: [facility] ORS;  Service: Urology;  Laterality: Bilateral;  . CYSTOSCOPY W/ URETERAL STENT PLACEMENT Right 01/02/2016   Procedure: CYSTOSCOPY WITH STENT REPLACEMENT;  Surgeon: Hollice Espy, MD;  Location: [facility] ORS;  Service: Urology;  Laterality: Right;  . CYSTOSCOPY WITH STENT PLACEMENT Right 12/04/2015    Procedure: CYSTOSCOPY WITH STENT PLACEMENT;  Surgeon: Hollice Espy, MD;  Location: [facility] ORS;  Service: Urology;  Laterality: Right;  . JOINT REPLACEMENT Left 2013   hip  . KNEE ARTHROSCOPY    . SEPTOPLASTY    . TRANSURETHRAL RESECTION OF BLADDER TUMOR WITH MITOMYCIN-C N/A 01/02/2016   Procedure: TRANSURETHRAL RESECTION OF BLADDER TUMOR WITH MITOMYCIN-C;  Surgeon: Hollice Espy, MD;  Location: [facility] ORS;  Service: Urology;  Laterality: N/A;  . URETEROSCOPY WITH HOLMIUM LASER LITHOTRIPSY Right 01/02/2016   Procedure: URETEROSCOPY WITH HOLMIUM LASER LITHOTRIPSY;  Surgeon: Hollice Espy, MD;  Location: [facility] ORS;  Service: Urology;  Laterality: Right;  . UVULOPALATOPHARYNGOPLASTY     and tongue surgery    Family History  Problem Relation Age of Onset  . Breast cancer Mother 39  . Kidney cancer Father   . Kidney Stones Brother   . Prostate cancer Neg Hx     Social History:  reports that she has never smoked. She has never used smokeless tobacco. She reports that she does not drink alcohol or use drugs.  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  She lives in [place].  The patient is alone today.  Allergies:  Allergies  Allergen Reactions  . Ativan [Lorazepam]     Hysteria   . Cardura [Doxazosin] Other (See Comments)    unsure  . Clonidine Derivatives Other (See Comments)    unsure  . Enalapril Other (See Comments)    unsure  . Levaquin [Levofloxacin] Other (See Comments)    Couldn't raise her arms  . Mobic [Meloxicam] Other (See Comments)    unsure  . Sulfa Antibiotics Nausea Only  . Vicodin [Hydrocodone-Acetaminophen] Other (See Comments)    unsure    Current Medications: Current Outpatient Prescriptions  Medication Sig Dispense Refill  . amLODipine (NORVASC) 5 MG tablet Take 5 mg by mouth every morning.     Marland Kitchen aspirin EC 81 MG tablet Take 1 tablet (81 mg total) by mouth daily. 90 tablet 3  . calcium carbonate (OSCAL) 1500 (600 Ca) MG TABS tablet Take 1,500 mg by mouth 2 (two) times  daily with a meal.    . DORZOLAMIDE HCL-TIMOLOL MAL OP Place 1 drop into both eyes 2 (two) times daily.    Marland Kitchen exemestane (AROMASIN) 25 MG tablet Take 1 tablet (25 mg total) by mouth daily after breakfast. 30 tablet 11  . furosemide (LASIX) 20 MG tablet Take 20 mg by mouth daily as needed.    . hydrochlorothiazide (HYDRODIURIL) 25 MG tablet Take 25 mg by mouth daily.    Marland Kitchen ibuprofen (ADVIL,MOTRIN) 200 MG tablet Take 400 mg by mouth every 6 (six) hours as needed.     Marland Kitchen levothyroxine (SYNTHROID, LEVOTHROID) 75 MCG tablet Take 75 mcg by mouth daily before breakfast.    . loratadine (CLARITIN) 10 MG tablet Take 10 mg by mouth daily.    Marland Kitchen losartan (COZAAR) 100 MG tablet Take 100 mg by mouth every morning.     . lovastatin (MEVACOR) 40 MG  tablet Take 40 mg by mouth at bedtime.    Marland Kitchen LUMIGAN 0.01 % SOLN Place 1 drop into both eyes at bedtime.     . metoprolol succinate (TOPROL-XL) 100 MG 24 hr tablet Take 100 mg by mouth 2 (two) times daily. Take with or immediately following a meal.    . Multiple Vitamin (MULTIVITAMIN) tablet Take 1 tablet by mouth daily.    . pantoprazole (PROTONIX) 40 MG tablet Take 40 mg by mouth daily.    . potassium chloride SA (K-DUR,KLOR-CON) 20 MEQ tablet Take 20 mEq by mouth 2 (two) times daily.     No current facility-administered medications for this visit.     Review of Systems:  GENERAL:  Feels good.  Active.  No fevers, sweats or weight loss. PERFORMANCE STATUS (ECOG):  0 HEENT:  No visual changes, runny nose, sore throat, mouth sores or tenderness. Lungs: No shortness of breath or cough.  No hemoptysis. Cardiac:  No chest pain, palpitations, orthopnea, or PND. GI:  No nausea, vomiting, diarrhea, constipation, melena or hematochezia. Up-to-date on colonoscopy. GU:  No urgency, frequency, dysuria, or hematuria. Musculoskeletal:  No back pain.  Knee aches.  No muscle tenderness. Extremities:  No pain or swelling. Skin:  No rashes or skin changes. Neuro:  No headache,  numbness or weakness, balance or coordination issues. Endocrine:  No diabetes, thyroid issues, hot flashes or night sweats. Psych:  No mood changes, depression or anxiety. Pain:  No focal pain. Review of systems:  All other systems reviewed and found to be negative.  Physical Exam: Blood pressure (!) 164/70, pulse 67, temperature 98.6 F (37 C), temperature source Tympanic, resp. rate 18, weight 206 lb 2.1 oz (93.5 kg). GENERAL:  Well developed, well nourished, woman sitting comfortably in the exam room in no acute distress. MENTAL STATUS:  Alert and oriented to person, place and time. HEAD:  Brown hair.  Normocephalic, atraumatic, face symmetric, no Cushingoid features. EYES:  Glasses.  Blue eyes.  Pupils equal round and reactive to light and accomodation.  No conjunctivitis or scleral icterus. ENT:  Oropharynx clear without lesion.  Tongue normal. Mucous membranes moist.  RESPIRATORY:  Clear to auscultation without rales, wheezes or rhonchi. CARDIOVASCULAR:  Regular rate and rhythm without murmur, rub or gallop. BREAST:  Right breast witn minimal fibrocystic changes.  No masses, skin changes or nipple discharge.  Left breast with post-opertative changes between 6 o'clock and 6:30 position.  No masses, skin changes or nipple discharge.  ABDOMEN:  Soft, non-tender, with active bowel sounds, and no hepatosplenomegaly.  No masses. SKIN:  No rashes, ulcers or lesions. EXTREMITIES: No edema, no skin discoloration or tenderness.  No palpable cords. LYMPH NODES: No palpable cervical, supraclavicular, axillary or inguinal adenopathy  NEUROLOGICAL: Unremarkable. PSYCH:  Appropriate.   Appointment on 06/12/2016  Component Date Value Ref Range Status  . WBC 06/12/2016 8.0  3.6 - 11.0 K/uL Final  . RBC 06/12/2016 4.74  3.80 - 5.20 MIL/uL Final  . Hemoglobin 06/12/2016 14.8  12.0 - 16.0 g/dL Final  . HCT 06/12/2016 43.0  35.0 - 47.0 % Final  . MCV 06/12/2016 90.7  80.0 - 100.0 fL Final  . MCH  06/12/2016 31.3  26.0 - 34.0 pg Final  . MCHC 06/12/2016 34.5  32.0 - 36.0 g/dL Final  . RDW 06/12/2016 14.3  11.5 - 14.5 % Final  . Platelets 06/12/2016 306  150 - 440 K/uL Final  . Neutrophils Relative % 06/12/2016 69  % Final  . Neutro  Abs 06/12/2016 5.4  1.4 - 6.5 K/uL Final  . Lymphocytes Relative 06/12/2016 20  % Final  . Lymphs Abs 06/12/2016 1.6  1.0 - 3.6 K/uL Final  . Monocytes Relative 06/12/2016 6  % Final  . Monocytes Absolute 06/12/2016 0.5  0.2 - 0.9 K/uL Final  . Eosinophils Relative 06/12/2016 4  % Final  . Eosinophils Absolute 06/12/2016 0.3  0 - 0.7 K/uL Final  . Basophils Relative 06/12/2016 1  % Final  . Basophils Absolute 06/12/2016 0.1  0 - 0.1 K/uL Final  . Sodium 06/12/2016 141  135 - 145 mmol/L Final  . Potassium 06/12/2016 3.9  3.5 - 5.1 mmol/L Final  . Chloride 06/12/2016 99* 101 - 111 mmol/L Final  . CO2 06/12/2016 31  22 - 32 mmol/L Final  . Glucose, Bld 06/12/2016 121* 65 - 99 mg/dL Final  . BUN 06/12/2016 19  6 - 20 mg/dL Final  . Creatinine, Ser 06/12/2016 1.09* 0.44 - 1.00 mg/dL Final  . Calcium 06/12/2016 9.4  8.9 - 10.3 mg/dL Final  . Total Protein 06/12/2016 7.9  6.5 - 8.1 g/dL Final  . Albumin 06/12/2016 4.3  3.5 - 5.0 g/dL Final  . AST 06/12/2016 34  15 - 41 U/L Final  . ALT 06/12/2016 32  14 - 54 U/L Final  . Alkaline Phosphatase 06/12/2016 109  38 - 126 U/L Final  . Total Bilirubin 06/12/2016 0.6  0.3 - 1.2 mg/dL Final  . GFR calc non Af Amer 06/12/2016 48* >60 mL/min Final  . GFR calc Af Amer 06/12/2016 55* >60 mL/min Final   Comment: (NOTE) The eGFR has been calculated using the CKD EPI equation. This calculation has not been validated in all clinical situations. eGFR's persistently <60 mL/min signify possible Chronic Kidney Disease.   . Anion gap 06/12/2016 11  5 - 15 Final    Assessment:  Alyssa Duncan is a 76 y.o. 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  Pathology revealed a 3.6 cm area of grade II  ductal carcinoma in situ. Margins were clear but close at 1 mm.  DCIS was ER + (> 90%), PR + (> 90%).  Pathologic stage was TisNx.  She received 5000 cGy to her left breast and boost scar another 1600 cGy using electron beam based on the close margin.  She completed radiation in 06/2012.    She was initially treated with Femara, but discontinued secondary to joint pains.  She tried tamoxifen, but discontinued secondary to swelling.  She has been on Aromasin since 11/16/2012.  She is tolerating it well.  Bilateral diagnostic mammogram on 03/24/2016 revealed no evidence of malignancy.  Diagnostic left mammogram and ultrasound on 06/09/2016 revealed an irregular hypoechogenicity in the left breast at 6 o'clock 2 cm from the nipple measuring 9 x 7 x 10 mm. This was felt likely to represent post treatment change but underlying malignancy could not be excluded.  Ultrasound of the left axilla revealed no enlarged adenopathy.   Bone density on 08/22/2015 was normal with a T-score of -1.0 in the left femur.    She has a history of non-muscle invasive bladder cancer.  She underwent TURBT on 01/02/2016.  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  Cystoscopy on 05/01/2016 revealed a very small low-grade appearing recurrence.  She underwent cystoscopy and fulguration on 05/06/2016.  Plan is for follow-up cystoscopy on 08/04/2016.  Symptomatically, she feels "normal".  Exam reveals post-operative and post-radiation changes in the  left breast.  Plan: 1.  Review entire medical history, diagnosis and management of DCIS. 2.  Discuss recent mammogram.  Discuss planned biopsy.  Suspect pathology will be negative (fat necrosis). 3.  Discuss bone density study.  Bone density is normal.  Discuss calcium and vitamin D.  Next bone density in 08/2017. 4.  Labs today:  CBC with diff, CMP. 5.  Follow-up on ultrasound guided left breast biopsy on 06/19/2016. 6.  RTC in 6 months for MD  assessment and labs (CBC with diff, CMP).   Lequita Asal, MD  06/12/2016, 3:00 PM

## 2016-06-19 ENCOUNTER — Ambulatory Visit
Admission: RE | Admit: 2016-06-19 | Discharge: 2016-06-19 | Disposition: A | Discharge status: Home / Self Care | Payer: Medicare Other | Source: Ambulatory Visit | Attending: Surgery | Admitting: Surgery

## 2016-06-19 DIAGNOSIS — N6320 Unspecified lump in the left breast, unspecified quadrant: Secondary | ICD-10-CM | POA: Insufficient documentation

## 2016-06-19 DIAGNOSIS — Z9012 Acquired absence of left breast and nipple: Secondary | ICD-10-CM | POA: Diagnosis not present

## 2016-06-19 DIAGNOSIS — N641 Fat necrosis of breast: Secondary | ICD-10-CM | POA: Insufficient documentation

## 2016-06-19 DIAGNOSIS — R928 Other abnormal and inconclusive findings on diagnostic imaging of breast: Secondary | ICD-10-CM

## 2016-06-20 LAB — SURGICAL PATHOLOGY

## 2016-06-22 ENCOUNTER — Encounter: Payer: Self-pay | Admitting: Hematology and Oncology

## 2016-07-02 ENCOUNTER — Encounter: Payer: Self-pay | Admitting: Radiation Oncology

## 2016-07-02 ENCOUNTER — Ambulatory Visit
Admission: RE | Admit: 2016-07-02 | Discharge: 2016-07-02 | Disposition: A | Discharge status: Home / Self Care | Payer: Medicare Other | Source: Ambulatory Visit | Attending: Radiation Oncology | Admitting: Radiation Oncology

## 2016-07-02 VITALS — BP 154/78 | HR 71 | Temp 98.2°F | Wt 205.5 lb

## 2016-07-02 DIAGNOSIS — Z170 Estrogen receptor positive status [ER+]: Secondary | ICD-10-CM | POA: Insufficient documentation

## 2016-07-02 DIAGNOSIS — Z923 Personal history of irradiation: Secondary | ICD-10-CM | POA: Diagnosis not present

## 2016-07-02 DIAGNOSIS — D0512 Intraductal carcinoma in situ of left breast: Secondary | ICD-10-CM | POA: Diagnosis not present

## 2016-07-02 NOTE — Progress Notes (Signed)
Radiation Oncology Follow up Note  Name: Alyssa Duncan   Date:   07/02/2016 MRN:  [id number] DOB: 1940-05-19    This 76 y.o. female presents to the clinic today for for your follow-up. 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 Seen today she is doing well. She had an area of nodularity which was investigated and biopsied and was benign. 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 She specifically denies breast tenderness cough or bone pain.  COMPLICATIONS OF TREATMENT: none  FOLLOW UP COMPLIANCE: keeps appointments   PHYSICAL EXAM:  BP (!) 154/78   Pulse 71   Temp 98.2 F (36.8 C)   Wt 205 lb 7.5 oz (93.2 kg)   BMI 37.58 kg/m  Left breast is somewhat retracted towards the nipple. There is some firmness present in the lumpectomy site consistent with known fat necrosis. No other dominant mass or nodularity is noted in either breast in 2 positions examined. No axillary or supraclavicular adenopathy is identified. Well-developed well-nourished patient in NAD. HEENT reveals PERLA, EOMI, discs not visualized.  Oral cavity is clear. No oral mucosal lesions are identified. Neck is clear without evidence of cervical or supraclavicular adenopathy. Lungs are clear to A&P. Cardiac examination is essentially unremarkable with regular rate and rhythm without murmur rub or thrill. Abdomen is benign with no organomegaly or masses noted. Motor sensory and DTR levels are equal and symmetric in the upper and lower extremities. Cranial nerves II through XII are grossly intact. Proprioception is intact. No peripheral adenopathy or edema is identified. No motor or sensory levels are  noted. Crude visual fields are within normal range.  RADIOLOGY RESULTS: Mammograms are reviewed and compatible with the above-stated findings. Pathology report is also reviewed  PLAN: Present time she is now out over 4 years and doing well with no evidence of disease. Recent biopsy showed fat necrosis. I am please were overall progress. I'm going to discontinue follow-up care. She continues close follow-up care with medical oncology and surgery. Patient is to call with any concerns. She continues on Aromasin therapy.  I would like to take this opportunity to thank you for allowing me to participate in the care of your patient.Armstead Peaks., MD

## 2016-07-30 ENCOUNTER — Encounter: Payer: Self-pay | Admitting: Urology

## 2016-07-30 ENCOUNTER — Ambulatory Visit (INDEPENDENT_AMBULATORY_CARE_PROVIDER_SITE_OTHER): Payer: Medicare Other | Admitting: Urology

## 2016-07-30 VITALS — BP 176/72 | HR 72 | Ht 62.0 in | Wt 203.0 lb

## 2016-07-30 DIAGNOSIS — C679 Malignant neoplasm of bladder, unspecified: Secondary | ICD-10-CM

## 2016-07-30 LAB — URINALYSIS, COMPLETE
Bilirubin, UA: NEGATIVE
Glucose, UA: NEGATIVE
Ketones, UA: NEGATIVE
NITRITE UA: NEGATIVE
PH UA: 7 (ref 5.0–7.5)
Specific Gravity, UA: 1.015 (ref 1.005–1.030)
UUROB: 0.2 mg/dL (ref 0.2–1.0)

## 2016-07-30 LAB — MICROSCOPIC EXAMINATION
BACTERIA UA: NONE SEEN
WBC UA: NONE SEEN /HPF (ref 0–?)

## 2016-07-30 MED ORDER — DOXYCYCLINE HYCLATE 100 MG PO TABS
100.0000 mg | ORAL_TABLET | Freq: Once | ORAL | Status: AC
  Administered 2016-07-30: 100 mg via ORAL

## 2016-07-30 MED ORDER — LIDOCAINE HCL 2 % EX GEL
1.0000 "application " | Freq: Once | CUTANEOUS | Status: AC
  Administered 2016-07-30: 1 via URETHRAL

## 2016-07-30 NOTE — Progress Notes (Signed)
   07/30/16  CC:  Chief Complaint  Patient presents with  . Cysto    HPI: 76 year old female admitted on 12/03/2015 with fevers, right flank pain found to have an obstructing right distal 5 mm ureteral stone. She was taken to the operating room for cystoscopy, right ureteral stent placement in the setting of worsening leukocytosis and uncontrolled pain.  Intraoperatively, the bladder tumor was identified.    She returned to the operating room on 01/02/2016 for definitive management of her stone via ureteroscopy, laser lithotripsy followed by TURBT. 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  Most recent cystoscopy on 05/01/2016 showed a very small low-grade appearing recurrence ~2 mm, anterior bladder wall in close proximity to the bladder neck. She underwent cystoscopy, fulguration of this lesion in the office shortly thereafter which was well tolerated.    She returns today for routine follow up.    Height 5\' 2"  (1.575 m), weight 203 lb (92.1 kg). NED. A&Ox3.   No respiratory distress   Abd soft, NT, ND Normal external genitalia with patent urethral meatus  Cystoscopy/ Bladder lesion fulgeration procedure Note  Patient identification was confirmed, informed consent was obtained, and patient was prepped using Betadine solution.  Lidocaine jelly was instilled into the bladder and allowed to dwell for 30 minutes.  Preoperative abx where received prior to procedure.    Procedure: - Flexible cystoscope introduced, without any difficulty.   - Thorough search of the bladder revealed:    normal urethral meatus    normal urothelium    no stones    no ulcers     no urethral polyps    no trabeculation  - Ureteral orifices were normal in position and appearance.  Retroflexion showed no evidence of bladder tumor and bladder neck  Post-Procedure: - Patient tolerated the procedure  well  Assessment/ Plan:  1. 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 - lidocaine (XYLOCAINE) 2 % jelly 1 application; Place 1 application into the urethra once.  2. History of kidney stones Currently asymptomatic.  RUS negative 02/08/16 negative for residual hydro or stones  Return in about 3 months (around 10/29/2016) for cystoscopy.\   Hollice Espy, MD

## 2016-08-12 DIAGNOSIS — C50912 Malignant neoplasm of unspecified site of left female breast: Secondary | ICD-10-CM | POA: Insufficient documentation

## 2016-08-12 DIAGNOSIS — Z170 Estrogen receptor positive status [ER+]: Secondary | ICD-10-CM

## 2016-08-25 ENCOUNTER — Ambulatory Visit: Payer: Medicare Other | Admitting: Cardiovascular Disease

## 2016-10-10 ENCOUNTER — Encounter: Payer: Self-pay | Admitting: Cardiovascular Disease

## 2016-10-10 ENCOUNTER — Ambulatory Visit (INDEPENDENT_AMBULATORY_CARE_PROVIDER_SITE_OTHER): Payer: Medicare Other | Admitting: Cardiovascular Disease

## 2016-10-10 ENCOUNTER — Ambulatory Visit: Payer: Medicare Other | Admitting: Cardiovascular Disease

## 2016-10-10 VITALS — BP 130/64 | HR 70 | Ht 62.0 in | Wt 205.5 lb

## 2016-10-10 DIAGNOSIS — I480 Paroxysmal atrial fibrillation: Secondary | ICD-10-CM | POA: Diagnosis not present

## 2016-10-10 DIAGNOSIS — I10 Essential (primary) hypertension: Secondary | ICD-10-CM | POA: Diagnosis not present

## 2016-10-10 NOTE — Progress Notes (Signed)
Cardiology Office Note   Date:  10/10/2016   ID:  Alyssa Duncan, DOB 09/20/1940, MRN [id number]  PCP:  Adin Hector, MD  Cardiologist:   Kathlyn Sacramento, MD   Chief Complaint  Patient presents with  . Other    6 month follow up. Meds reviewed by the pt. verbally.       History of Present Illness: Alyssa Duncan is a 76 y.o. female who presents for a follow-up visit for paroxysmal atrial fibrillation. She has no previous cardiac history. She Had atrial fibrillation with RVR in 2017  in the setting of sepsis and bacteremia due to obstructive urethral stones.  She had an echocardiogram done which showed normal LV systolic function, mild mitral and aortic regurgitation and normal atrial size. Pulmonary pressure was normal. She had no recurrent arrhythmia off amiodarone. She was taken off anticoagulation given that atrial fibrillation was in the setting of acute illness. She has been doing well and denies any chest pain, shortness of breath or palpitations.  Past Medical History:  Diagnosis Date  . Arthritis   . Asthma   . Back pain   . Breast cancer ([place]) 2013   left breast ductal and lobular carcinoma  . Dysrhythmia 12/2015   A-fib  . Family history of adverse reaction to anesthesia    dad had hard time waking up from anesthesia  . GERD (gastroesophageal reflux disease)   . Hyperlipidemia   . Hypertension   . Hypothyroidism   . Kidney stones 12/2015  . Personal history of radiation therapy 2013   BREAST CA  . Shingles   . Skin cancer 2005   left arm and nose  . Sleep apnea     Past Surgical History:  Procedure Laterality Date  . BREAST BIOPSY Left 2013   POS  . BREAST LUMPECTOMY Left 2013   with rad tx  . CARPAL TUNNEL RELEASE    . CATARACT EXTRACTION    . COLONOSCOPY WITH PROPOFOL N/A 05/10/2015   Procedure: COLONOSCOPY WITH PROPOFOL;  Surgeon: Hulen Luster, MD;  Location: [facility] ENDOSCOPY;  Service: Gastroenterology;  Laterality: N/A;  . CYSTOSCOPY W/  RETROGRADES Bilateral 01/02/2016   Procedure: CYSTOSCOPY WITH RETROGRADE PYELOGRAM;  Surgeon: Hollice Espy, MD;  Location: [facility] ORS;  Service: Urology;  Laterality: Bilateral;  . CYSTOSCOPY W/ URETERAL STENT PLACEMENT Right 01/02/2016   Procedure: CYSTOSCOPY WITH STENT REPLACEMENT;  Surgeon: Hollice Espy, MD;  Location: [facility] ORS;  Service: Urology;  Laterality: Right;  . CYSTOSCOPY WITH STENT PLACEMENT Right 12/04/2015   Procedure: CYSTOSCOPY WITH STENT PLACEMENT;  Surgeon: Hollice Espy, MD;  Location: [facility] ORS;  Service: Urology;  Laterality: Right;  . JOINT REPLACEMENT Left 2013   hip  . KNEE ARTHROSCOPY    . SEPTOPLASTY    . TRANSURETHRAL RESECTION OF BLADDER TUMOR WITH MITOMYCIN-C N/A 01/02/2016   Procedure: TRANSURETHRAL RESECTION OF BLADDER TUMOR WITH MITOMYCIN-C;  Surgeon: Hollice Espy, MD;  Location: [facility] ORS;  Service: Urology;  Laterality: N/A;  . URETEROSCOPY WITH HOLMIUM LASER LITHOTRIPSY Right 01/02/2016   Procedure: URETEROSCOPY WITH HOLMIUM LASER LITHOTRIPSY;  Surgeon: Hollice Espy, MD;  Location: [facility] ORS;  Service: Urology;  Laterality: Right;  . UVULOPALATOPHARYNGOPLASTY     and tongue surgery     Current Outpatient Prescriptions  Medication Sig Dispense Refill  . amLODipine (NORVASC) 5 MG tablet Take 5 mg by mouth every morning.     Marland Kitchen aspirin EC 81 MG tablet Take 1 tablet (81 mg total) by mouth  daily. 90 tablet 3  . calcium carbonate (OSCAL) 1500 (600 Ca) MG TABS tablet Take 1,500 mg by mouth 2 (two) times daily with a meal.    . DORZOLAMIDE HCL-TIMOLOL MAL OP Place 1 drop into both eyes 2 (two) times daily.    Marland Kitchen exemestane (AROMASIN) 25 MG tablet Take 1 tablet (25 mg total) by mouth daily after breakfast. 30 tablet 11  . furosemide (LASIX) 20 MG tablet Take 20 mg by mouth daily as needed.    . hydrochlorothiazide (HYDRODIURIL) 25 MG tablet Take 25 mg by mouth daily.    Marland Kitchen ibuprofen (ADVIL,MOTRIN) 200 MG tablet Take 400 mg by mouth every 6 (six) hours as needed.       Marland Kitchen levothyroxine (SYNTHROID, LEVOTHROID) 75 MCG tablet Take 75 mcg by mouth daily before breakfast.    . loratadine (CLARITIN) 10 MG tablet Take 10 mg by mouth daily.    Marland Kitchen losartan (COZAAR) 100 MG tablet Take 100 mg by mouth every morning.     . lovastatin (MEVACOR) 40 MG tablet Take 40 mg by mouth at bedtime.    Marland Kitchen LUMIGAN 0.01 % SOLN Place 1 drop into both eyes at bedtime.     . metoprolol succinate (TOPROL-XL) 100 MG 24 hr tablet Take 100 mg by mouth 2 (two) times daily. Take with or immediately following a meal.    . Multiple Vitamin (MULTIVITAMIN) tablet Take 1 tablet by mouth daily.    . pantoprazole (PROTONIX) 40 MG tablet Take 40 mg by mouth daily.    . potassium chloride SA (K-DUR,KLOR-CON) 20 MEQ tablet Take 20 mEq by mouth 2 (two) times daily.    . Prenatal Vit-Fe Fumarate-FA (PRENATAL MULTIVITAMIN) TABS tablet Take 1 tablet by mouth daily at 12 noon.     No current facility-administered medications for this visit.     Allergies:   Ativan [lorazepam]; Cardura [doxazosin]; Clonidine derivatives; Enalapril; Levaquin [levofloxacin]; Mobic [meloxicam]; Sulfa antibiotics; and Vicodin [hydrocodone-acetaminophen]    Social History:  The patient  reports that she has never smoked. She has never used smokeless tobacco. She reports that she does not drink alcohol or use drugs.   Family History:  The patient's family history includes Breast cancer (age of onset: 23) in her mother; Kidney Stones in her brother; Kidney cancer in her father.    ROS:  Please see the history of present illness.   Otherwise, review of systems are positive for none.   All other systems are reviewed and negative.    PHYSICAL EXAM: VS:  BP 130/64 (BP Location: Right Arm, Patient Position: Sitting, Cuff Size: Large)   Pulse 70   Ht 5\' 2"  (1.575 m)   Wt 205 lb 8 oz (93.2 kg)   BMI 37.59 kg/m  , BMI Body mass index is 37.59 kg/m. GEN: Well nourished, well developed, in no acute distress  HEENT: normal  Neck:  no JVD, carotid bruits, or masses Cardiac: RRR; no murmurs, rubs, or gallops,no edema  Respiratory:  clear to auscultation bilaterally, normal work of breathing GI: soft, nontender, nondistended, + BS MS: no deformity or atrophy  Skin: warm and dry, no rash Neuro:  Strength and sensation are intact Psych: euthymic mood, full affect   EKG:  EKG is ordered today. The ekg ordered today demonstrates  normal sinus rhythm with  Possible left atrial enlargement.  Recent Labs: 12/06/2015: Magnesium 1.9; TSH 2.910 06/12/2016: ALT 32; BUN 19; Creatinine, Ser 1.09; Hemoglobin 14.8; Platelets 306; Potassium 3.9; Sodium 141  Lipid Panel No results found for: CHOL, TRIG, HDL, CHOLHDL, VLDL, LDLCALC, LDLDIRECT    Wt Readings from Last 3 Encounters:  10/10/16 205 lb 8 oz (93.2 kg)  07/30/16 203 lb (92.1 kg)  07/02/16 205 lb 7.5 oz (93.2 kg)       No flowsheet data found.    ASSESSMENT AND PLAN:  1.  Paroxysmal atrial fibrillation: The patient had One episode of atrial fibrillation in the setting of sepsis and respiratory distress .  No recurrent episodes since then. Continue observation and resume anticoagulation if she develops recurrent atrial fibrillation.   2. Essential hypertension: Blood pressure is controlled on medications.    Disposition:   FU with me in 1 year.   Signed,  Kathlyn Sacramento, MD  10/10/2016 3:39 PM    [place]

## 2016-10-10 NOTE — Patient Instructions (Signed)
Medication Instructions: Continue same medications.   Labwork: None.   Procedures/Testing: None.   Follow-Up: 1 year with Dr. Arida.   Any Additional Special Instructions Will Be Listed Below (If Applicable).     If you need a refill on your cardiac medications before your next appointment, please call your pharmacy.   

## 2016-10-29 ENCOUNTER — Encounter: Payer: Self-pay | Admitting: Urology

## 2016-10-29 ENCOUNTER — Ambulatory Visit (INDEPENDENT_AMBULATORY_CARE_PROVIDER_SITE_OTHER): Payer: Medicare Other | Admitting: Urology

## 2016-10-29 VITALS — BP 169/83 | HR 73 | Ht 62.0 in | Wt 205.0 lb

## 2016-10-29 DIAGNOSIS — C679 Malignant neoplasm of bladder, unspecified: Secondary | ICD-10-CM | POA: Diagnosis not present

## 2016-10-29 LAB — URINALYSIS, COMPLETE
BILIRUBIN UA: NEGATIVE
Glucose, UA: NEGATIVE
Ketones, UA: NEGATIVE
NITRITE UA: NEGATIVE
PH UA: 7 (ref 5.0–7.5)
SPEC GRAV UA: 1.01 (ref 1.005–1.030)
UUROB: 0.2 mg/dL (ref 0.2–1.0)

## 2016-10-29 LAB — MICROSCOPIC EXAMINATION: BACTERIA UA: NONE SEEN

## 2016-10-29 MED ORDER — LIDOCAINE HCL 2 % EX GEL
1.0000 "application " | Freq: Once | CUTANEOUS | Status: AC
  Administered 2016-10-29: 1 via URETHRAL

## 2016-10-29 MED ORDER — DOXYCYCLINE HYCLATE 100 MG PO TABS
100.0000 mg | ORAL_TABLET | Freq: Once | ORAL | Status: AC
  Administered 2016-10-29: 100 mg via ORAL

## 2016-10-29 NOTE — Progress Notes (Signed)
   10/29/16  CC:  Chief Complaint  Patient presents with  . Cysto    HPI: 76 year old female admitted on 12/03/2015 with fevers, right flank pain found to have an obstructing right distal 5 mm ureteral stone. She was taken to the operating room for cystoscopy, right ureteral stent placement in the setting of worsening leukocytosis and uncontrolled pain.  Intraoperatively, the bladder tumor was identified.    She returned to the operating room on 01/02/2016 for definitive management of her stone via ureteroscopy, laser lithotripsy followed by TURBT. 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  Most recent cystoscopy on 05/01/2016 showed a very small low-grade appearing recurrence ~2 mm, anterior bladder wall in close proximity to the bladder neck. She underwent cystoscopy, fulguration of this lesion in the office shortly thereafter which was well tolerated.    She returns today for routine follow up.  Last cystoscopy 4/18 negative.  Height 5\' 2"  (1.575 m), weight 203 lb (92.1 kg). NED. A&Ox3.   No respiratory distress   Abd soft, NT, ND Normal external genitalia with patent urethral meatus  Cystoscopy/ Bladder lesion fulgeration procedure Note  Patient identification was confirmed, informed consent was obtained, and patient was prepped using Betadine solution.  Lidocaine jelly was instilled into the bladder and allowed to dwell for 30 minutes.  Preoperative abx where received prior to procedure.    Procedure: - Flexible cystoscope introduced, without any difficulty.   - Thorough search of the bladder revealed:    normal urethral meatus    normal urothelium    no stones    no ulcers     no urethral polyps    no trabeculation  - Ureteral orifices were normal in position and appearance.  Retroflexion showed no evidence of bladder tumor and bladder neck  Post-Procedure: - Patient  tolerated the procedure well  Assessment/ Plan:  1. 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 - lidocaine (XYLOCAINE) 2 % jelly 1 application; Place 1 application into the urethra once.  2. History of kidney stones Currently asymptomatic.  RUS negative 02/08/16 negative for residual hydro or stones  Return in about 3 months (around 01/29/2017) for cysto.\   Alyssa Espy, MD

## 2016-12-15 ENCOUNTER — Ambulatory Visit: Payer: Medicare Other | Admitting: Hematology and Oncology

## 2016-12-15 ENCOUNTER — Other Ambulatory Visit: Payer: Medicare Other

## 2016-12-18 ENCOUNTER — Inpatient Hospital Stay: Payer: Medicare Other | Attending: Hematology and Oncology

## 2016-12-18 ENCOUNTER — Other Ambulatory Visit: Payer: Self-pay | Admitting: *Deleted

## 2016-12-18 ENCOUNTER — Inpatient Hospital Stay (HOSPITAL_BASED_OUTPATIENT_CLINIC_OR_DEPARTMENT_OTHER): Payer: Medicare Other | Admitting: Hematology and Oncology

## 2016-12-18 ENCOUNTER — Encounter: Payer: Self-pay | Admitting: Hematology and Oncology

## 2016-12-18 VITALS — BP 153/83 | HR 65 | Temp 98.6°F | Resp 12 | Ht 62.0 in | Wt 203.0 lb

## 2016-12-18 DIAGNOSIS — K219 Gastro-esophageal reflux disease without esophagitis: Secondary | ICD-10-CM | POA: Diagnosis not present

## 2016-12-18 DIAGNOSIS — Z79899 Other long term (current) drug therapy: Secondary | ICD-10-CM | POA: Diagnosis not present

## 2016-12-18 DIAGNOSIS — Z803 Family history of malignant neoplasm of breast: Secondary | ICD-10-CM | POA: Insufficient documentation

## 2016-12-18 DIAGNOSIS — E785 Hyperlipidemia, unspecified: Secondary | ICD-10-CM | POA: Insufficient documentation

## 2016-12-18 DIAGNOSIS — E039 Hypothyroidism, unspecified: Secondary | ICD-10-CM | POA: Insufficient documentation

## 2016-12-18 DIAGNOSIS — Z853 Personal history of malignant neoplasm of breast: Secondary | ICD-10-CM

## 2016-12-18 DIAGNOSIS — Z800 Family history of malignant neoplasm of digestive organs: Secondary | ICD-10-CM | POA: Insufficient documentation

## 2016-12-18 DIAGNOSIS — J45909 Unspecified asthma, uncomplicated: Secondary | ICD-10-CM | POA: Insufficient documentation

## 2016-12-18 DIAGNOSIS — Z85828 Personal history of other malignant neoplasm of skin: Secondary | ICD-10-CM | POA: Insufficient documentation

## 2016-12-18 DIAGNOSIS — D0512 Intraductal carcinoma in situ of left breast: Secondary | ICD-10-CM | POA: Insufficient documentation

## 2016-12-18 DIAGNOSIS — I10 Essential (primary) hypertension: Secondary | ICD-10-CM

## 2016-12-18 DIAGNOSIS — Z170 Estrogen receptor positive status [ER+]: Secondary | ICD-10-CM | POA: Diagnosis not present

## 2016-12-18 DIAGNOSIS — Z87442 Personal history of urinary calculi: Secondary | ICD-10-CM | POA: Diagnosis not present

## 2016-12-18 DIAGNOSIS — I4891 Unspecified atrial fibrillation: Secondary | ICD-10-CM | POA: Diagnosis not present

## 2016-12-18 DIAGNOSIS — M129 Arthropathy, unspecified: Secondary | ICD-10-CM | POA: Diagnosis not present

## 2016-12-18 DIAGNOSIS — Z79811 Long term (current) use of aromatase inhibitors: Secondary | ICD-10-CM | POA: Insufficient documentation

## 2016-12-18 DIAGNOSIS — Z7982 Long term (current) use of aspirin: Secondary | ICD-10-CM | POA: Insufficient documentation

## 2016-12-18 DIAGNOSIS — Z923 Personal history of irradiation: Secondary | ICD-10-CM

## 2016-12-18 DIAGNOSIS — G4730 Sleep apnea, unspecified: Secondary | ICD-10-CM

## 2016-12-18 LAB — COMPREHENSIVE METABOLIC PANEL
ALT: 32 U/L (ref 14–54)
AST: 34 U/L (ref 15–41)
Albumin: 4.2 g/dL (ref 3.5–5.0)
Alkaline Phosphatase: 93 U/L (ref 38–126)
Anion gap: 10 (ref 5–15)
BUN: 18 mg/dL (ref 6–20)
CO2: 30 mmol/L (ref 22–32)
Calcium: 9.5 mg/dL (ref 8.9–10.3)
Chloride: 99 mmol/L — ABNORMAL LOW (ref 101–111)
Creatinine, Ser: 1.08 mg/dL — ABNORMAL HIGH (ref 0.44–1.00)
GFR calc Af Amer: 55 mL/min — ABNORMAL LOW (ref >60)
GFR calc non Af Amer: 48 mL/min — ABNORMAL LOW (ref >60)
Glucose, Bld: 122 mg/dL — ABNORMAL HIGH (ref 65–99)
Potassium: 3.9 mmol/L (ref 3.5–5.1)
Sodium: 139 mmol/L (ref 135–145)
Total Bilirubin: 0.7 mg/dL (ref 0.3–1.2)
Total Protein: 7.5 g/dL (ref 6.5–8.1)

## 2016-12-18 LAB — CBC WITH DIFFERENTIAL/PLATELET
Basophils Absolute: 0.1 10*3/uL (ref 0–0.1)
Basophils Relative: 1 %
Eosinophils Absolute: 0.4 10*3/uL (ref 0–0.7)
Eosinophils Relative: 5 %
HCT: 42.5 % (ref 35.0–47.0)
Hemoglobin: 14.7 g/dL (ref 12.0–16.0)
Lymphocytes Relative: 21 %
Lymphs Abs: 1.8 10*3/uL (ref 1.0–3.6)
MCH: 31.5 pg (ref 26.0–34.0)
MCHC: 34.6 g/dL (ref 32.0–36.0)
MCV: 91 fL (ref 80.0–100.0)
Monocytes Absolute: 0.6 10*3/uL (ref 0.2–0.9)
Monocytes Relative: 8 %
Neutro Abs: 5.5 10*3/uL (ref 1.4–6.5)
Neutrophils Relative %: 65 %
Platelets: 309 10*3/uL (ref 150–440)
RBC: 4.67 MIL/uL (ref 3.80–5.20)
RDW: 13.7 % (ref 11.5–14.5)
WBC: 8.3 10*3/uL (ref 3.6–11.0)

## 2016-12-18 NOTE — Progress Notes (Signed)
[facility] day:  12/18/2016  Chief Complaint: Alyssa Duncan is a 76 y.o. 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  HPI:  The patient was last seen in the medical oncology clinic on 06/12/2016.  At that time, she was seen for initial assessment by me.  She denied any complaint.  Exam revealed post-operative and post-radiation changes in the left breast.  Diagnostic left mammogram and ultrasound on 06/09/2016 revealed an irregular hypoechogenicity in the left breast at 6 o'clock 2 cm from the nipple measuring 9 x 7 x 10 mm. This was felt likely to represent post treatment change but underlying malignancy could not be excluded.  She was scheduled for biopsy.  Ultrasound guided biopsy on 06/19/2016 revealed changes c/w organizing fat necrosis.  Symptomatically, she is doing "great". Patient denies any physical symptoms. She has not had any fevers, sweats, weight loss, or bleeding. Diet is good; denies significant weight loss. Patient continues on Aromasin with no perceived side effects. Patient is scheduled with Dr. Rochel Brome next week.    Past Medical History:  Diagnosis Date  . Arthritis   . Asthma   . Back pain   . Breast cancer ([place]) 2013   left breast ductal and lobular carcinoma  . Dysrhythmia 12/2015   A-fib  . Family history of adverse reaction to anesthesia    dad had hard time waking up from anesthesia  . GERD (gastroesophageal reflux disease)   . Hyperlipidemia   . Hypertension   . Hypothyroidism   . Kidney stones 12/2015  . Personal history of radiation therapy 2013   BREAST CA  . Shingles   . Skin cancer 2005   left arm and nose  . Sleep apnea     Past Surgical History:  Procedure Laterality Date  . BREAST BIOPSY Left 2013   POS  . BREAST LUMPECTOMY Left 2013   with rad tx  . CARPAL TUNNEL RELEASE    . CATARACT EXTRACTION    . COLONOSCOPY WITH PROPOFOL N/A 05/10/2015   Procedure:  COLONOSCOPY WITH PROPOFOL;  Surgeon: Hulen Luster, MD;  Location: [facility] ENDOSCOPY;  Service: Gastroenterology;  Laterality: N/A;  . CYSTOSCOPY W/ RETROGRADES Bilateral 01/02/2016   Procedure: CYSTOSCOPY WITH RETROGRADE PYELOGRAM;  Surgeon: Hollice Espy, MD;  Location: [facility] ORS;  Service: Urology;  Laterality: Bilateral;  . CYSTOSCOPY W/ URETERAL STENT PLACEMENT Right 01/02/2016   Procedure: CYSTOSCOPY WITH STENT REPLACEMENT;  Surgeon: Hollice Espy, MD;  Location: [facility] ORS;  Service: Urology;  Laterality: Right;  . CYSTOSCOPY WITH STENT PLACEMENT Right 12/04/2015   Procedure: CYSTOSCOPY WITH STENT PLACEMENT;  Surgeon: Hollice Espy, MD;  Location: [facility] ORS;  Service: Urology;  Laterality: Right;  . JOINT REPLACEMENT Left 2013   hip  . KNEE ARTHROSCOPY    . SEPTOPLASTY    . TRANSURETHRAL RESECTION OF BLADDER TUMOR WITH MITOMYCIN-C N/A 01/02/2016   Procedure: TRANSURETHRAL RESECTION OF BLADDER TUMOR WITH MITOMYCIN-C;  Surgeon: Hollice Espy, MD;  Location: [facility] ORS;  Service: Urology;  Laterality: N/A;  . URETEROSCOPY WITH HOLMIUM LASER LITHOTRIPSY Right 01/02/2016   Procedure: URETEROSCOPY WITH HOLMIUM LASER LITHOTRIPSY;  Surgeon: Hollice Espy, MD;  Location: [facility] ORS;  Service: Urology;  Laterality: Right;  . UVULOPALATOPHARYNGOPLASTY     and tongue surgery    Family History  Problem Relation Age of Onset  . Breast cancer Mother 17  . Kidney cancer Father   . Kidney Stones Brother   .  Prostate cancer Neg Hx     Social History:  reports that she has never smoked. She has never used smokeless tobacco. She reports that she does not drink alcohol or use drugs.  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  She lives in [place].  The patient is alone today.  Allergies:  Allergies  Allergen Reactions  . Ativan [Lorazepam]     Hysteria   . Cardura [Doxazosin] Other (See Comments)    unsure  . Clonidine Derivatives Other (See Comments)    unsure  . Enalapril Other (See Comments)    unsure  . Levaquin  [Levofloxacin] Other (See Comments)    Couldn't raise her arms  . Mobic [Meloxicam] Other (See Comments)    unsure  . Sulfa Antibiotics Nausea Only  . Vicodin [Hydrocodone-Acetaminophen] Other (See Comments)    unsure    Current Medications: Current Outpatient Prescriptions  Medication Sig Dispense Refill  . amLODipine (NORVASC) 5 MG tablet Take 5 mg by mouth every morning.     Marland Kitchen aspirin EC 81 MG tablet Take 1 tablet (81 mg total) by mouth daily. 90 tablet 3  . calcium carbonate (OSCAL) 1500 (600 Ca) MG TABS tablet Take 1,500 mg by mouth 2 (two) times daily with a meal.    . DORZOLAMIDE HCL-TIMOLOL MAL OP Place 1 drop into both eyes 2 (two) times daily.    Marland Kitchen exemestane (AROMASIN) 25 MG tablet Take 1 tablet (25 mg total) by mouth daily after breakfast. 30 tablet 11  . furosemide (LASIX) 20 MG tablet Take 20 mg by mouth daily as needed.    . hydrochlorothiazide (HYDRODIURIL) 25 MG tablet Take 25 mg by mouth daily.    Marland Kitchen ibuprofen (ADVIL,MOTRIN) 200 MG tablet Take 400 mg by mouth every 6 (six) hours as needed.     Marland Kitchen levothyroxine (SYNTHROID, LEVOTHROID) 75 MCG tablet Take 75 mcg by mouth daily before breakfast.    . loratadine (CLARITIN) 10 MG tablet Take 10 mg by mouth daily.    Marland Kitchen losartan (COZAAR) 100 MG tablet Take 100 mg by mouth every morning.     . lovastatin (MEVACOR) 40 MG tablet Take 40 mg by mouth at bedtime.    Marland Kitchen LUMIGAN 0.01 % SOLN Place 1 drop into both eyes at bedtime.     . metoprolol succinate (TOPROL-XL) 100 MG 24 hr tablet Take 100 mg by mouth 2 (two) times daily. Take with or immediately following a meal.    . Multiple Vitamin (MULTIVITAMIN) tablet Take 1 tablet by mouth daily.    . pantoprazole (PROTONIX) 40 MG tablet Take 40 mg by mouth daily.    . potassium chloride (K-DUR) 10 MEQ tablet Take by mouth.    . potassium chloride SA (K-DUR,KLOR-CON) 20 MEQ tablet Take 20 mEq by mouth 2 (two) times daily.    . Prenatal Vit-Fe Fumarate-FA (PRENATAL MULTIVITAMIN) TABS tablet  Take 1 tablet by mouth daily at 12 noon.     No current facility-administered medications for this visit.     Review of Systems:  GENERAL:  Feels "fine"  "No problems".  Active.  No fevers or sweats. Weight down 2 pounds.  PERFORMANCE STATUS (ECOG):  0 HEENT:  No visual changes, runny nose, sore throat, mouth sores or tenderness. Lungs: No shortness of breath or cough.  No hemoptysis. Cardiac:  No chest pain, palpitations, orthopnea, or PND. GI:  No nausea, vomiting, diarrhea, constipation, melena or hematochezia. Up-to-date on colonoscopy. GU:  No urgency, frequency, dysuria, or hematuria. Musculoskeletal:  No  back pain.  Knee aches.  No muscle tenderness. Extremities:  No pain or swelling. Skin:  No rashes or skin changes. Neuro:  No headache, numbness or weakness, balance or coordination issues. Endocrine:  No diabetes, thyroid issues, hot flashes or night sweats. Psych:  No mood changes, depression or anxiety. Pain:  No focal pain. Review of systems:  All other systems reviewed and found to be negative.  Physical Exam: Blood pressure (!) 153/83, pulse 65, temperature 98.6 F (37 C), temperature source Tympanic, resp. rate 12, height 5' 2" (1.575 m), weight 203 lb (92.1 kg). GENERAL:  Well developed, well nourished, woman sitting comfortably in the exam room in no acute distress. MENTAL STATUS:  Alert and oriented to person, place and time. HEAD:  Brown hair.  Normocephalic, atraumatic, face symmetric, no Cushingoid features. EYES:  Glasses.  Blue eyes.  Pupils equal round and reactive to light and accomodation.  No conjunctivitis or scleral icterus. ENT:  Oropharynx clear without lesion.  Tongue normal. Mucous membranes moist.  RESPIRATORY:  Clear to auscultation without rales, wheezes or rhonchi. CARDIOVASCULAR:  Regular rate and rhythm without murmur, rub or gallop. BREAST:  Right breast with minimal fibrocystic changes.  No masses, skin changes or nipple discharge.  Left breast  with post-operative changes between 6 o'clock and 6:30 position.  Left nipple indentation with scar.  Inferior edema.  No masses, skin changes or nipple discharge.  ABDOMEN:  Soft, non-tender, with active bowel sounds, and no hepatosplenomegaly.  No masses. SKIN:  No rashes, ulcers or lesions. EXTREMITIES: No edema, no skin discoloration or tenderness.  No palpable cords. LYMPH NODES: No palpable cervical, supraclavicular, axillary or inguinal adenopathy  NEUROLOGICAL: Unremarkable. PSYCH:  Appropriate.   Appointment on 12/18/2016  Component Date Value Ref Range Status  . WBC 12/18/2016 8.3  3.6 - 11.0 K/uL Final  . RBC 12/18/2016 4.67  3.80 - 5.20 MIL/uL Final  . Hemoglobin 12/18/2016 14.7  12.0 - 16.0 g/dL Final  . HCT 12/18/2016 42.5  35.0 - 47.0 % Final  . MCV 12/18/2016 91.0  80.0 - 100.0 fL Final  . MCH 12/18/2016 31.5  26.0 - 34.0 pg Final  . MCHC 12/18/2016 34.6  32.0 - 36.0 g/dL Final  . RDW 12/18/2016 13.7  11.5 - 14.5 % Final  . Platelets 12/18/2016 309  150 - 440 K/uL Final  . Neutrophils Relative % 12/18/2016 65  % Final  . Neutro Abs 12/18/2016 5.5  1.4 - 6.5 K/uL Final  . Lymphocytes Relative 12/18/2016 21  % Final  . Lymphs Abs 12/18/2016 1.8  1.0 - 3.6 K/uL Final  . Monocytes Relative 12/18/2016 8  % Final  . Monocytes Absolute 12/18/2016 0.6  0.2 - 0.9 K/uL Final  . Eosinophils Relative 12/18/2016 5  % Final  . Eosinophils Absolute 12/18/2016 0.4  0 - 0.7 K/uL Final  . Basophils Relative 12/18/2016 1  % Final  . Basophils Absolute 12/18/2016 0.1  0 - 0.1 K/uL Final  . Sodium 12/18/2016 139  135 - 145 mmol/L Final  . Potassium 12/18/2016 3.9  3.5 - 5.1 mmol/L Final  . Chloride 12/18/2016 99* 101 - 111 mmol/L Final  . CO2 12/18/2016 30  22 - 32 mmol/L Final  . Glucose, Bld 12/18/2016 122* 65 - 99 mg/dL Final  . BUN 12/18/2016 18  6 - 20 mg/dL Final  . Creatinine, Ser 12/18/2016 1.08* 0.44 - 1.00 mg/dL Final  . Calcium 12/18/2016 9.5  8.9 - 10.3 mg/dL Final  .  Total  Protein 12/18/2016 7.5  6.5 - 8.1 g/dL Final  . Albumin 12/18/2016 4.2  3.5 - 5.0 g/dL Final  . AST 12/18/2016 34  15 - 41 U/L Final  . ALT 12/18/2016 32  14 - 54 U/L Final  . Alkaline Phosphatase 12/18/2016 93  38 - 126 U/L Final  . Total Bilirubin 12/18/2016 0.7  0.3 - 1.2 mg/dL Final  . GFR calc non Af Amer 12/18/2016 48* >60 mL/min Final  . GFR calc Af Amer 12/18/2016 55* >60 mL/min Final   Comment: (NOTE) The eGFR has been calculated using the CKD EPI equation. This calculation has not been validated in all clinical situations. eGFR's persistently <60 mL/min signify possible Chronic Kidney Disease.   . Anion gap 12/18/2016 10  5 - 15 Final    Assessment:  Alyssa Duncan is a 76 y.o. 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  Pathology revealed a 3.6 cm area of grade II ductal carcinoma in situ. Margins were clear but close at 1 mm.  DCIS was ER + (> 90%), PR + (> 90%).  Pathologic stage was TisNx.  She received 5000 cGy to her left breast and boost scar another 1600 cGy using electron beam based on the close margin.  She completed radiation in 06/2012.    She was initially treated with Femara, but discontinued secondary to joint pains.  She tried tamoxifen, but discontinued secondary to swelling.  She has been on Aromasin since 11/16/2012.  She is tolerating it well.  Bilateral diagnostic mammogram on 03/24/2016 revealed no evidence of malignancy.  Diagnostic left mammogram and ultrasound on 06/09/2016 revealed an irregular hypoechogenicity in the left breast at 6 o'clock 2 cm from the nipple measuring 9 x 7 x 10 mm. This was felt likely to represent post treatment change but underlying malignancy could not be excluded.  Ultrasound of the left axilla revealed no enlarged adenopathy.   Ultrasound guided biopsy on 06/19/2016 revealed changes c/w organizing fat necrosis.  Bone density on 08/22/2015 was normal with a T-score of -1.0 in the left femur.     She has a history of non-muscle invasive bladder cancer.  She underwent TURBT on 01/02/2016.  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  Cystoscopy on 05/01/2016 revealed a very small low-grade appearing recurrence.  She underwent cystoscopy and fulguration on 05/06/2016.  Plan is for follow-up cystoscopy on 08/04/2016.  Symptomatically, she feels "great".  Exam reveals post-operative and post-radiation changes in the left breast. Labs are unremarkable.   Plan: 1.  Labs today:  CBC with diff, CMP. 2.  Discuss interval breast biopsy- fat necrosis. 3.  Continue Aromasin.  4.  RTC in 6 months for MD assessment and labs (CBC with diff, CMP).   Honor Loh, NP  12/18/2016, 2:50 PM   I saw and evaluated the patient, participating in the key portions of the service and reviewing pertinent diagnostic studies and records.  I reviewed the nurse practitioner's note and agree with the findings and the plan.  The assessment and plan were discussed with the patient.  A few questions were asked by the patient and answered.   Lequita Asal, MD 12/18/2016

## 2016-12-18 NOTE — Progress Notes (Signed)
Patient here for follow up she has no complaints today no changes in her left breast.

## 2017-01-28 ENCOUNTER — Ambulatory Visit: Payer: Medicare Other | Admitting: Urology

## 2017-01-28 ENCOUNTER — Encounter: Payer: Self-pay | Admitting: Urology

## 2017-01-28 VITALS — BP 149/72 | HR 71 | Ht 62.0 in | Wt 205.0 lb

## 2017-01-28 DIAGNOSIS — C679 Malignant neoplasm of bladder, unspecified: Secondary | ICD-10-CM

## 2017-01-28 DIAGNOSIS — Z87442 Personal history of urinary calculi: Secondary | ICD-10-CM

## 2017-01-28 LAB — URINALYSIS, COMPLETE
BILIRUBIN UA: NEGATIVE
GLUCOSE, UA: NEGATIVE
NITRITE UA: NEGATIVE
PROTEIN UA: NEGATIVE
Specific Gravity, UA: 1.02 (ref 1.005–1.030)
UUROB: 0.2 mg/dL (ref 0.2–1.0)
pH, UA: 6 (ref 5.0–7.5)

## 2017-01-28 LAB — MICROSCOPIC EXAMINATION

## 2017-01-28 MED ORDER — DOXYCYCLINE HYCLATE 100 MG PO TABS: 100.0000 mg | ORAL_TABLET | Freq: Once | ORAL | Status: DC

## 2017-01-28 MED ORDER — LIDOCAINE HCL 2 % EX GEL
1.0000 "application " | Freq: Once | CUTANEOUS | Status: AC
  Administered 2017-01-28: 1 via URETHRAL

## 2017-01-28 NOTE — Progress Notes (Signed)
   01/28/17  CC:  Chief Complaint  Patient presents with  . Cysto    HPI: 76 year old female admitted on 12/03/2015 with fevers, right flank pain found to have an obstructing right distal 5 mm ureteral stone. She was taken to the operating room for cystoscopy, right ureteral stent placement in the setting of worsening leukocytosis and uncontrolled pain.  Intraoperatively, the bladder tumor was identified.    She returned to the operating room on 01/02/2016 for definitive management of her stone via ureteroscopy, laser lithotripsy followed by TURBT. 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  Most recent cystoscopy on 05/01/2016 showed a very small low-grade appearing recurrence ~2 mm, anterior bladder wall in close proximity to the bladder neck. She underwent cystoscopy, fulguration of this lesion in the office shortly thereafter which was well tolerated.    She returns today for routine follow up.  Last cystoscopy 7/18 negative.  Height 5\' 2"  (1.575 m), weight 203 lb (92.1 kg). NED. A&Ox3.   No respiratory distress   Abd soft, NT, ND Normal external genitalia with patent urethral meatus  Cystoscopy/ Bladder lesion fulgeration procedure Note  Patient identification was confirmed, informed consent was obtained, and patient was prepped using Betadine solution.  Lidocaine jelly was instilled into the bladder and allowed to dwell for 30 minutes.  Preoperative abx where received prior to procedure.    Procedure: - Flexible cystoscope introduced, without any difficulty.   - Thorough search of the bladder revealed:    normal urethral meatus    normal urothelium    no stones    no ulcers     no urethral polyps    no trabeculation  - Ureteral orifices were normal in position and appearance.  Retroflexion showed no evidence of bladder tumor and bladder neck  Post-Procedure: - Patient  tolerated the procedure well  Assessment/ Plan:  1. 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 - lidocaine (XYLOCAINE) 2 % jelly 1 application; Place 1 application into the urethra once.  2. History of kidney stones Currently asymptomatic.  RUS negative 02/08/16 negative for residual hydro or stones  Return in about 3 months (around 04/30/2017) for cysto.\   Hollice Espy, MD

## 2017-02-19 ENCOUNTER — Other Ambulatory Visit: Payer: Self-pay | Admitting: Internal Medicine

## 2017-02-19 DIAGNOSIS — Z1231 Encounter for screening mammogram for malignant neoplasm of breast: Secondary | ICD-10-CM

## 2017-03-13 ENCOUNTER — Ambulatory Visit
Admission: RE | Admit: 2017-03-13 | Discharge: 2017-03-13 | Disposition: A | Discharge status: Home / Self Care | Payer: Medicare Other | Source: Ambulatory Visit | Attending: Nurse Practitioner | Admitting: Nurse Practitioner

## 2017-03-13 ENCOUNTER — Encounter: Payer: Self-pay | Admitting: Nurse Practitioner

## 2017-03-13 ENCOUNTER — Other Ambulatory Visit: Payer: Self-pay | Admitting: Urgent Care

## 2017-03-13 ENCOUNTER — Telehealth: Payer: Self-pay | Admitting: *Deleted

## 2017-03-13 ENCOUNTER — Inpatient Hospital Stay: Payer: Medicare Other | Attending: Nurse Practitioner | Admitting: Nurse Practitioner

## 2017-03-13 ENCOUNTER — Telehealth: Payer: Self-pay | Admitting: Nurse Practitioner

## 2017-03-13 VITALS — BP 145/78 | HR 69 | Temp 97.7°F | Resp 16 | Wt 206.0 lb

## 2017-03-13 DIAGNOSIS — N6320 Unspecified lump in the left breast, unspecified quadrant: Secondary | ICD-10-CM | POA: Diagnosis present

## 2017-03-13 DIAGNOSIS — Z85828 Personal history of other malignant neoplasm of skin: Secondary | ICD-10-CM | POA: Insufficient documentation

## 2017-03-13 DIAGNOSIS — Z79899 Other long term (current) drug therapy: Secondary | ICD-10-CM | POA: Insufficient documentation

## 2017-03-13 DIAGNOSIS — Z803 Family history of malignant neoplasm of breast: Secondary | ICD-10-CM | POA: Diagnosis not present

## 2017-03-13 DIAGNOSIS — G4730 Sleep apnea, unspecified: Secondary | ICD-10-CM

## 2017-03-13 DIAGNOSIS — E785 Hyperlipidemia, unspecified: Secondary | ICD-10-CM | POA: Diagnosis not present

## 2017-03-13 DIAGNOSIS — I4891 Unspecified atrial fibrillation: Secondary | ICD-10-CM

## 2017-03-13 DIAGNOSIS — I10 Essential (primary) hypertension: Secondary | ICD-10-CM | POA: Insufficient documentation

## 2017-03-13 DIAGNOSIS — J45909 Unspecified asthma, uncomplicated: Secondary | ICD-10-CM | POA: Insufficient documentation

## 2017-03-13 DIAGNOSIS — Z79811 Long term (current) use of aromatase inhibitors: Secondary | ICD-10-CM | POA: Diagnosis not present

## 2017-03-13 DIAGNOSIS — D0512 Intraductal carcinoma in situ of left breast: Secondary | ICD-10-CM

## 2017-03-13 DIAGNOSIS — N6321 Unspecified lump in the left breast, upper outer quadrant: Secondary | ICD-10-CM | POA: Insufficient documentation

## 2017-03-13 DIAGNOSIS — Z170 Estrogen receptor positive status [ER+]: Secondary | ICD-10-CM | POA: Diagnosis not present

## 2017-03-13 DIAGNOSIS — Z87442 Personal history of urinary calculi: Secondary | ICD-10-CM | POA: Insufficient documentation

## 2017-03-13 DIAGNOSIS — Z853 Personal history of malignant neoplasm of breast: Secondary | ICD-10-CM | POA: Diagnosis not present

## 2017-03-13 DIAGNOSIS — E039 Hypothyroidism, unspecified: Secondary | ICD-10-CM | POA: Diagnosis not present

## 2017-03-13 DIAGNOSIS — Z7982 Long term (current) use of aspirin: Secondary | ICD-10-CM

## 2017-03-13 DIAGNOSIS — M129 Arthropathy, unspecified: Secondary | ICD-10-CM | POA: Insufficient documentation

## 2017-03-13 DIAGNOSIS — K219 Gastro-esophageal reflux disease without esophagitis: Secondary | ICD-10-CM | POA: Diagnosis not present

## 2017-03-13 DIAGNOSIS — R928 Other abnormal and inconclusive findings on diagnostic imaging of breast: Secondary | ICD-10-CM

## 2017-03-13 NOTE — Telephone Encounter (Signed)
Phoned patient with results from mammogram and ultrasound. New lump was identified as bi-rads cat 4: suspicious. Discussed that next step is for biopsy. Patient verbalized understanding and appreciation for timely imaging. No additional questions at this time.   Discussed results of imaging with Honor Loh, NP for Dr. Mike Gip (patient's primary med-oncologist). Gaspar Bidding will place order for biopsy, arrange scheduling and follow-up appointments. Thank you for the opportunity to participate in the care of this very pleasant patient.

## 2017-03-13 NOTE — Progress Notes (Addendum)
Symptom Management Consult note [facility]  [phone] [phone]  Patient Care Team: Adin Hector, MD as PCP - General (Internal Medicine)   Name of the patient: Alyssa Duncan  [id number]  Jan 26, 1941   Date of visit: 03/13/17  Diagnosis- left breast DCIS  Chief complaint/ Reason for visit- new breast mass   Heme/Onc history: Patient last seen by primary oncologist, Dr. Mike Gip, 12/18/16.  Currently, she continues Aromasin.  06/09/16 MM and u/s revealed irregular hypoechogenicity in left breast at 6:00 2cm from nipple measuring 9x7x45mm. 06/19/16 biopsy revealed changes consistent with fat necrosis.   Interval history- Patient presents to [facility] today for evaluation of new breast mass which presented 2 days ago in left breast at approximately 3:00. She describes feeling a dime sized lump with mild 'discomfort'. She routinely performs self breast exams and noted the change on a self breast exam. She is currently taking Aromasin and anticipates completing 5 years of therapy in January 2019. Denies nipple discharge, swelling, or redness. She has a personal history of DCIS. Reports feeling well. Denies fevers, sweats, weight loss, and/or bleeding. Appetite is good. Denies unexplained weight loss. Tolerating Aromasin well.    ECOG FS:0 - Asymptomatic  Review of systems- Review of Systems  Constitutional: Negative for chills, fever, malaise/fatigue and weight loss.  Respiratory: Negative for cough and shortness of breath.   Cardiovascular: Negative for chest pain and palpitations.  Gastrointestinal: Negative for abdominal pain, constipation, diarrhea and nausea.  Musculoskeletal: Negative for myalgias.  Skin: Negative for itching and rash.  Neurological: Negative for sensory change, weakness and headaches.  Endo/Heme/Allergies: Does not bruise/bleed easily.  Psychiatric/Behavioral: The patient is nervous/anxious.       Current treatment- Aromasin- completes 5 years of therapy January 2019  Allergies  Allergen Reactions  . Ativan [Lorazepam]     Hysteria   . Cardura [Doxazosin] Other (See Comments)    unsure  . Clonidine Derivatives Other (See Comments)    unsure  . Enalapril Other (See Comments)    unsure  . Levaquin [Levofloxacin] Other (See Comments)    Couldn't raise her arms  . Mobic [Meloxicam] Other (See Comments)    unsure  . Sulfa Antibiotics Nausea Only  . Vicodin [Hydrocodone-Acetaminophen] Other (See Comments)    unsure     Past Medical History:  Diagnosis Date  . Arthritis   . Asthma   . Back pain   . Breast cancer ([place]) 2013   left breast ductal and lobular carcinoma  . Dysrhythmia 12/2015   A-fib  . Family history of adverse reaction to anesthesia    dad had hard time waking up from anesthesia  . GERD (gastroesophageal reflux disease)   . Hyperlipidemia   . Hypertension   . Hypothyroidism   . Kidney stones 12/2015  . Personal history of radiation therapy 2013   BREAST CA  . Shingles   . Skin cancer 2005   left arm and nose  . Sleep apnea      Past Surgical History:  Procedure Laterality Date  . BREAST BIOPSY Left 2013   POS  . BREAST LUMPECTOMY Left 2013   with rad tx  . CARPAL TUNNEL RELEASE    . CATARACT EXTRACTION    . COLONOSCOPY WITH PROPOFOL N/A 05/10/2015   Procedure: COLONOSCOPY WITH PROPOFOL;  Surgeon: Hulen Luster, MD;  Location: [facility] ENDOSCOPY;  Service: Gastroenterology;  Laterality: N/A;  . CYSTOSCOPY W/ RETROGRADES Bilateral 01/02/2016  Procedure: CYSTOSCOPY WITH RETROGRADE PYELOGRAM;  Surgeon: Hollice Espy, MD;  Location: [facility] ORS;  Service: Urology;  Laterality: Bilateral;  . CYSTOSCOPY W/ URETERAL STENT PLACEMENT Right 01/02/2016   Procedure: CYSTOSCOPY WITH STENT REPLACEMENT;  Surgeon: Hollice Espy, MD;  Location: [facility] ORS;  Service: Urology;  Laterality: Right;  . CYSTOSCOPY WITH STENT PLACEMENT Right 12/04/2015   Procedure: CYSTOSCOPY  WITH STENT PLACEMENT;  Surgeon: Hollice Espy, MD;  Location: [facility] ORS;  Service: Urology;  Laterality: Right;  . JOINT REPLACEMENT Left 2013   hip  . KNEE ARTHROSCOPY    . SEPTOPLASTY    . TRANSURETHRAL RESECTION OF BLADDER TUMOR WITH MITOMYCIN-C N/A 01/02/2016   Procedure: TRANSURETHRAL RESECTION OF BLADDER TUMOR WITH MITOMYCIN-C;  Surgeon: Hollice Espy, MD;  Location: [facility] ORS;  Service: Urology;  Laterality: N/A;  . URETEROSCOPY WITH HOLMIUM LASER LITHOTRIPSY Right 01/02/2016   Procedure: URETEROSCOPY WITH HOLMIUM LASER LITHOTRIPSY;  Surgeon: Hollice Espy, MD;  Location: [facility] ORS;  Service: Urology;  Laterality: Right;  . UVULOPALATOPHARYNGOPLASTY     and tongue surgery    Social History   Socioeconomic History  . Marital status: Married    Spouse name: Not on file  . Number of children: Not on file  . Years of education: Not on file  . Highest education level: Not on file  Social Needs  . Financial resource strain: Not on file  . Food insecurity - worry: Not on file  . Food insecurity - inability: Not on file  . Transportation needs - medical: Not on file  . Transportation needs - non-medical: Not on file  Occupational History  . Not on file  Tobacco Use  . Smoking status: Never Smoker  . Smokeless tobacco: Never Used  Substance and Sexual Activity  . Alcohol use: No  . Drug use: No  . Sexual activity: No  Other Topics Concern  . Not on file  Social History Narrative  . Not on file    Family History  Problem Relation Age of Onset  . Breast cancer Mother 64  . Kidney cancer Father   . Kidney Stones Brother   . Prostate cancer Neg Hx      Current Outpatient Medications:  .  amLODipine (NORVASC) 5 MG tablet, Take 5 mg by mouth every morning. , Disp: , Rfl:  .  aspirin EC 81 MG tablet, Take 1 tablet (81 mg total) by mouth daily., Disp: 90 tablet, Rfl: 3 .  calcium carbonate (OSCAL) 1500 (600 Ca) MG TABS tablet, Take 1,500 mg by mouth 2 (two) times daily with  a meal., Disp: , Rfl:  .  DORZOLAMIDE HCL-TIMOLOL MAL OP, Place 1 drop into both eyes 2 (two) times daily., Disp: , Rfl:  .  exemestane (AROMASIN) 25 MG tablet, Take 1 tablet (25 mg total) by mouth daily after breakfast., Disp: 30 tablet, Rfl: 11 .  furosemide (LASIX) 20 MG tablet, Take 20 mg by mouth daily as needed., Disp: , Rfl:  .  hydrochlorothiazide (HYDRODIURIL) 25 MG tablet, Take 25 mg by mouth daily., Disp: , Rfl:  .  ibuprofen (ADVIL,MOTRIN) 200 MG tablet, Take 400 mg by mouth every 6 (six) hours as needed. , Disp: , Rfl:  .  levothyroxine (SYNTHROID, LEVOTHROID) 75 MCG tablet, Take 75 mcg by mouth daily before breakfast., Disp: , Rfl:  .  loratadine (CLARITIN) 10 MG tablet, Take 10 mg by mouth daily., Disp: , Rfl:  .  losartan (COZAAR) 100 MG tablet, Take 100 mg by mouth every morning. ,  Disp: , Rfl:  .  lovastatin (MEVACOR) 40 MG tablet, Take 40 mg by mouth at bedtime., Disp: , Rfl:  .  LUMIGAN 0.01 % SOLN, Place 1 drop into both eyes at bedtime. , Disp: , Rfl:  .  metoprolol succinate (TOPROL-XL) 100 MG 24 hr tablet, Take 100 mg by mouth 2 (two) times daily. Take with or immediately following a meal., Disp: , Rfl:  .  Multiple Vitamin (MULTIVITAMIN) tablet, Take 1 tablet by mouth daily., Disp: , Rfl:  .  pantoprazole (PROTONIX) 40 MG tablet, Take 40 mg by mouth daily., Disp: , Rfl:  .  potassium chloride (K-DUR) 10 MEQ tablet, Take by mouth., Disp: , Rfl:  .  potassium chloride SA (K-DUR,KLOR-CON) 20 MEQ tablet, Take 20 mEq by mouth 2 (two) times daily., Disp: , Rfl:   Current Facility-Administered Medications:  .  doxycycline (VIBRA-TABS) tablet 100 mg, 100 mg, Oral, Once, Hollice Espy, MD  Physical exam:  Vitals:   03/13/17 1039  BP: (!) 145/78  Pulse: 69  Resp: 16  Temp: 97.7 F (36.5 C)  TempSrc: Tympanic  Weight: 206 lb (93.4 kg)   Physical Exam  Constitutional: She is oriented to person, place, and time.  Pleasant, well developed, well nourished, well appearing  elderly female. Obese. Alone.  HENT:  Head: Normocephalic.  Eyes: No scleral icterus.  Neck: Normal range of motion. Neck supple.  Cardiovascular: Normal rate, regular rhythm and normal heart sounds.  Pulmonary/Chest: Breath sounds normal. She has no wheezes. Right breast exhibits no inverted nipple, no mass, no nipple discharge, no skin change and no tenderness. Left breast exhibits mass, skin change and tenderness. Left breast exhibits no inverted nipple and no nipple discharge. Breasts are asymmetrical.    Abdominal: Soft. She exhibits no distension. There is no tenderness.  Musculoskeletal: Normal range of motion.  Lymphadenopathy:    She has no cervical adenopathy.  Neurological: She is alert and oriented to person, place, and time.  Skin: Skin is warm and dry.  Psychiatric: Her mood appears anxious.     CMP Latest Ref Rng & Units 12/18/2016  Glucose 65 - 99 mg/dL 122(H)  BUN 6 - 20 mg/dL 18  Creatinine 0.44 - 1.00 mg/dL 1.08(H)  Sodium 135 - 145 mmol/L 139  Potassium 3.5 - 5.1 mmol/L 3.9  Chloride 101 - 111 mmol/L 99(L)  CO2 22 - 32 mmol/L 30  Calcium 8.9 - 10.3 mg/dL 9.5  Total Protein 6.5 - 8.1 g/dL 7.5  Total Bilirubin 0.3 - 1.2 mg/dL 0.7  Alkaline Phos 38 - 126 U/L 93  AST 15 - 41 U/L 34  ALT 14 - 54 U/L 32   CBC Latest Ref Rng & Units 12/18/2016  WBC 3.6 - 11.0 K/uL 8.3  Hemoglobin 12.0 - 16.0 g/dL 14.7  Hematocrit 35.0 - 47.0 % 42.5  Platelets 150 - 440 K/uL 309    No images are attached to the encounter.  Mm Diag Breast Tomo Bilateral  Result Date: 03/13/2017 CLINICAL DATA:  Patient presents with a small palpable abnormality in the upper outer left breast recently noted. Patient has history of a left lumpectomy for DCIS treated with adjuvant radiation therapy. Patient has also undergone a subsequent biopsy of the left breast in the posterior lower aspect, which revealed fat necrosis. EXAM: 2D DIGITAL DIAGNOSTIC BILATERAL MAMMOGRAM WITH CAD AND ADJUNCT TOMO  ULTRASOUND LEFT BREAST COMPARISON:  Previous exam(s). ACR Breast Density Category b: There are scattered areas of fibroglandular density. FINDINGS: The left breast, there  is a small irregular mass with central calcifications in the upper outer quadrant, superficial depth, new from prior exams. It lies anterior and slightly superior to the palpable abnormality. There is no mammographic abnormality to correspond to the palpable abnormality. There are no other masses. There is architectural distortion in the inferior left breast reflecting the postsurgical scarring, stable. There are no other areas of architectural distortion. There are no new or suspicious calcifications. Mammographic images were processed with CAD. On physical exam, no discrete mass is palpated in the upper outer left breast. Targeted ultrasound is performed, showing a hypoechoic irregular mass with surrounding increased echogenicity in the left breast at 2 o'clock, 10 cm the nipple, just below the dermis, measuring 6 x 5 x 6 mm. Some internal blood flow was detected on color Doppler analysis. This lies anterior and superior to the reported palpable abnormality. There is no sonographic abnormality to correspond to the reported palpable abnormality. Sonographic evaluation of the left axilla shows no enlarged or abnormal lymph nodes. IMPRESSION: 1. Suspicious 6 mm mass in the superficial upper outer left breast. Biopsy is indicated. RECOMMENDATION: 1. Ultrasound-guided core needle biopsy. I have discussed the findings and recommendations with the patient. Results were also provided in writing at the conclusion of the visit. If applicable, a reminder letter will be sent to the patient regarding the next appointment. BI-RADS CATEGORY  4: Suspicious. Electronically Signed   By: Lajean Manes M.D.   On: 03/13/2017 16:23     Assessment and plan- Patient is a 76 y.o. female with history of DCIS who presents to [facility] with concerns of a  newly palpable left breast mass.   1. Left Breast Mass- approximately 32mm left breast mass palpable with overlying retraction of skin at approximately 3 o'clock. She has a hemangioma slightly more medially to mass. Palpable radiation changes. Well healed surgical scar at 6 o'clock. Concerns for malignancy based on presentation and history of DCIS. Annual mammogram scheduled for 04/01/17. Will perform bilateral diagnostic mammogram asap with ultrasound. Discussed presentation and plan with Dr. Mike Gip who agreed with plan. Will call patient with results when available.    Visit Diagnosis 1. Mass of breast, left   2. Ductal carcinoma in situ (DCIS) of left breast     Patient expressed understanding and was in agreement with this plan. She also understands that She can call clinic at any time with any questions, concerns, or complaints.    Beckey Rutter, DNP AGNP-C [facility] Pager- [place]- [phone] 03/13/2017 8:55 PM   Addendum: Imaging revealed suspicious 31mm mass. Recommendation for ultrasound-guided core needle biopsy. Called patient with results. Honor Loh, NP to order and schedule follow-up.

## 2017-03-13 NOTE — Progress Notes (Signed)
Patient here as an acute add on today. She states that she found a lump in her left breast last night, which is also sore. She otherwise feels well and denies having any pain.

## 2017-03-13 NOTE — Telephone Encounter (Signed)
Reports that she is 5 years out from left breast cancer and has found a new lump in her left breast the size of her fingertip and it hurts.  Appointment accepted with Alease Medina NP at 10:30

## 2017-03-17 ENCOUNTER — Other Ambulatory Visit: Payer: Self-pay | Admitting: Urgent Care

## 2017-03-17 DIAGNOSIS — R928 Other abnormal and inconclusive findings on diagnostic imaging of breast: Secondary | ICD-10-CM

## 2017-03-24 ENCOUNTER — Ambulatory Visit
Admission: RE | Admit: 2017-03-24 | Discharge: 2017-03-24 | Disposition: A | Discharge status: Home / Self Care | Payer: Medicare Other | Source: Ambulatory Visit | Attending: Urgent Care | Admitting: Urgent Care

## 2017-03-24 DIAGNOSIS — R928 Other abnormal and inconclusive findings on diagnostic imaging of breast: Secondary | ICD-10-CM

## 2017-03-25 LAB — SURGICAL PATHOLOGY

## 2017-04-20 ENCOUNTER — Other Ambulatory Visit: Payer: Self-pay | Admitting: Urgent Care

## 2017-04-20 MED ORDER — ANASTROZOLE 1 MG PO TABS: 1.0000 mg | ORAL_TABLET | Freq: Every day | ORAL | 0 refills | Status: DC

## 2017-04-29 ENCOUNTER — Encounter: Payer: Self-pay | Admitting: Urology

## 2017-04-29 ENCOUNTER — Ambulatory Visit: Payer: Medicare Other | Admitting: Urology

## 2017-04-29 VITALS — BP 142/76 | HR 71 | Ht 62.0 in | Wt 203.0 lb

## 2017-04-29 DIAGNOSIS — C679 Malignant neoplasm of bladder, unspecified: Secondary | ICD-10-CM | POA: Diagnosis not present

## 2017-04-29 LAB — MICROSCOPIC EXAMINATION

## 2017-04-29 LAB — URINALYSIS, COMPLETE
Bilirubin, UA: NEGATIVE
GLUCOSE, UA: NEGATIVE
Nitrite, UA: NEGATIVE
PH UA: 6 (ref 5.0–7.5)
Specific Gravity, UA: 1.02 (ref 1.005–1.030)
UUROB: 0.2 mg/dL (ref 0.2–1.0)

## 2017-04-29 MED ORDER — LIDOCAINE HCL 2 % EX GEL
1.0000 "application " | Freq: Once | CUTANEOUS | Status: AC
  Administered 2017-04-29: 1 via URETHRAL

## 2017-04-29 MED ORDER — DOXYCYCLINE HYCLATE 100 MG PO TABS
100.0000 mg | ORAL_TABLET | Freq: Once | ORAL | Status: AC
  Administered 2017-04-29: 100 mg via ORAL

## 2017-04-29 NOTE — Progress Notes (Signed)
   04/29/17  CC:  Chief Complaint  Patient presents with  . Cysto    HPI: 77 year old female admitted on 12/03/2015 with fevers, right flank pain found to have an obstructing right distal 5 mm ureteral stone. She was taken to the operating room for cystoscopy, right ureteral stent placement in the setting of worsening leukocytosis and uncontrolled pain.  Intraoperatively, the bladder tumor was identified.    She returned to the operating room on 01/02/2016 for definitive management of her stone via ureteroscopy, laser lithotripsy followed by TURBT. 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  Cysto in office on 05/01/2016 showed a very small low-grade appearing recurrence ~2 mm, anterior bladder wall in close proximity to the bladder neck. She underwent cystoscopy, fulguration of this lesion in the office shortly thereafter which was well tolerated.    She returns today for routine follow up.  Last cystoscopy 10/18 negative.  Height 5\' 2"  (1.575 m), weight 203 lb (92.1 kg). NED. A&Ox3.   No respiratory distress   Abd soft, NT, ND Normal external genitalia with patent urethral meatus  Cystoscopy/ Bladder lesion fulgeration procedure Note  Patient identification was confirmed, informed consent was obtained, and patient was prepped using Betadine solution.  Lidocaine jelly was instilled into the bladder and allowed to dwell for 30 minutes.  Preoperative abx where received prior to procedure.    Procedure: - Flexible cystoscope introduced, without any difficulty.   - Thorough search of the bladder revealed:    normal urethral meatus    normal urothelium other than a subtle area of mild erythema on the posterior wall ?cystitis    no stones    no ulcers     no urethral polyps    no trabeculation  - Ureteral orifices were normal in position and appearance.  Retroflexion showed no  evidence of bladder tumor and bladder neck  Post-Procedure: - Patient tolerated the procedure well  Assessment/ Plan:  1. 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 - lidocaine (XYLOCAINE) 2 % jelly 1 application; Place 1 application into the urethra once.  2. History of kidney stones Currently asymptomatic.  RUS negative 02/08/16 negative for residual hydro or stones  Return in about 3 months (around 07/28/2017) for cysto.   Hollice Espy, MD

## 2017-05-04 ENCOUNTER — Other Ambulatory Visit: Payer: Self-pay | Admitting: Urology

## 2017-05-08 DIAGNOSIS — T884XXA Failed or difficult intubation, initial encounter: Secondary | ICD-10-CM

## 2017-05-08 HISTORY — DX: Failed or difficult intubation, initial encounter: T88.4XXA

## 2017-05-12 ENCOUNTER — Telehealth: Payer: Self-pay | Admitting: Radiology

## 2017-05-12 ENCOUNTER — Emergency Department: Payer: Medicare Other

## 2017-05-12 ENCOUNTER — Other Ambulatory Visit: Payer: Self-pay

## 2017-05-12 ENCOUNTER — Inpatient Hospital Stay
Admission: EM | Admit: 2017-05-12 | Discharge: 2017-05-16 | DRG: 417 | Disposition: A | Discharge status: Home / Self Care | Payer: Medicare Other | Attending: Internal Medicine | Admitting: Internal Medicine

## 2017-05-12 DIAGNOSIS — Z853 Personal history of malignant neoplasm of breast: Secondary | ICD-10-CM

## 2017-05-12 DIAGNOSIS — K219 Gastro-esophageal reflux disease without esophagitis: Secondary | ICD-10-CM | POA: Diagnosis present

## 2017-05-12 DIAGNOSIS — Z79899 Other long term (current) drug therapy: Secondary | ICD-10-CM

## 2017-05-12 DIAGNOSIS — K8066 Calculus of gallbladder and bile duct with acute and chronic cholecystitis without obstruction: Principal | ICD-10-CM | POA: Diagnosis present

## 2017-05-12 DIAGNOSIS — Z7982 Long term (current) use of aspirin: Secondary | ICD-10-CM

## 2017-05-12 DIAGNOSIS — E785 Hyperlipidemia, unspecified: Secondary | ICD-10-CM | POA: Diagnosis present

## 2017-05-12 DIAGNOSIS — Z85828 Personal history of other malignant neoplasm of skin: Secondary | ICD-10-CM

## 2017-05-12 DIAGNOSIS — K8050 Calculus of bile duct without cholangitis or cholecystitis without obstruction: Secondary | ICD-10-CM

## 2017-05-12 DIAGNOSIS — E039 Hypothyroidism, unspecified: Secondary | ICD-10-CM | POA: Diagnosis present

## 2017-05-12 DIAGNOSIS — Z96642 Presence of left artificial hip joint: Secondary | ICD-10-CM | POA: Diagnosis present

## 2017-05-12 DIAGNOSIS — K8071 Calculus of gallbladder and bile duct without cholecystitis with obstruction: Secondary | ICD-10-CM

## 2017-05-12 DIAGNOSIS — Z923 Personal history of irradiation: Secondary | ICD-10-CM

## 2017-05-12 DIAGNOSIS — J45909 Unspecified asthma, uncomplicated: Secondary | ICD-10-CM | POA: Diagnosis present

## 2017-05-12 DIAGNOSIS — I4891 Unspecified atrial fibrillation: Secondary | ICD-10-CM | POA: Diagnosis present

## 2017-05-12 DIAGNOSIS — K8020 Calculus of gallbladder without cholecystitis without obstruction: Secondary | ICD-10-CM

## 2017-05-12 DIAGNOSIS — K851 Biliary acute pancreatitis without necrosis or infection: Secondary | ICD-10-CM | POA: Diagnosis present

## 2017-05-12 DIAGNOSIS — R52 Pain, unspecified: Secondary | ICD-10-CM

## 2017-05-12 DIAGNOSIS — G4733 Obstructive sleep apnea (adult) (pediatric): Secondary | ICD-10-CM | POA: Diagnosis present

## 2017-05-12 DIAGNOSIS — R1011 Right upper quadrant pain: Secondary | ICD-10-CM | POA: Diagnosis not present

## 2017-05-12 DIAGNOSIS — I10 Essential (primary) hypertension: Secondary | ICD-10-CM | POA: Diagnosis present

## 2017-05-12 DIAGNOSIS — Z87442 Personal history of urinary calculi: Secondary | ICD-10-CM

## 2017-05-12 HISTORY — DX: Failed or difficult intubation, initial encounter: T88.4XXA

## 2017-05-12 LAB — LIPASE, BLOOD: LIPASE: 113 U/L — AB (ref 11–51)

## 2017-05-12 LAB — COMPREHENSIVE METABOLIC PANEL
ALT: 167 U/L — AB (ref 14–54)
AST: 155 U/L — AB (ref 15–41)
Albumin: 3.7 g/dL (ref 3.5–5.0)
Alkaline Phosphatase: 225 U/L — ABNORMAL HIGH (ref 38–126)
Anion gap: 13 (ref 5–15)
BUN: 18 mg/dL (ref 6–20)
CHLORIDE: 98 mmol/L — AB (ref 101–111)
CO2: 29 mmol/L (ref 22–32)
CREATININE: 1.17 mg/dL — AB (ref 0.44–1.00)
Calcium: 9.4 mg/dL (ref 8.9–10.3)
GFR calc Af Amer: 50 mL/min — ABNORMAL LOW (ref >60)
GFR calc non Af Amer: 43 mL/min — ABNORMAL LOW (ref >60)
Glucose, Bld: 169 mg/dL — ABNORMAL HIGH (ref 65–99)
Potassium: 3.5 mmol/L (ref 3.5–5.1)
SODIUM: 140 mmol/L (ref 135–145)
Total Bilirubin: 1.6 mg/dL — ABNORMAL HIGH (ref 0.3–1.2)
Total Protein: 7.5 g/dL (ref 6.5–8.1)

## 2017-05-12 LAB — CBC
HCT: 43.9 % (ref 35.0–47.0)
Hemoglobin: 14.9 g/dL (ref 12.0–16.0)
MCH: 31.3 pg (ref 26.0–34.0)
MCHC: 33.9 g/dL (ref 32.0–36.0)
MCV: 92.3 fL (ref 80.0–100.0)
PLATELETS: 319 10*3/uL (ref 150–440)
RBC: 4.76 MIL/uL (ref 3.80–5.20)
RDW: 13.7 % (ref 11.5–14.5)
WBC: 8.1 10*3/uL (ref 3.6–11.0)

## 2017-05-12 MED ORDER — LEVOTHYROXINE SODIUM 50 MCG PO TABS
75.0000 ug | ORAL_TABLET | Freq: Every day | ORAL | Status: DC
  Administered 2017-05-13 – 2017-05-16 (×3): 75 ug via ORAL
  Filled 2017-05-12 (×3): qty 2

## 2017-05-12 MED ORDER — ALBUTEROL SULFATE (2.5 MG/3ML) 0.083% IN NEBU: 2.5000 mg | INHALATION_SOLUTION | RESPIRATORY_TRACT | Status: DC | PRN

## 2017-05-12 MED ORDER — ENOXAPARIN SODIUM 40 MG/0.4ML ~~LOC~~ SOLN
40.0000 mg | SUBCUTANEOUS | Status: DC
  Administered 2017-05-12: 40 mg via SUBCUTANEOUS
  Filled 2017-05-12: qty 0.4

## 2017-05-12 MED ORDER — METOPROLOL TARTRATE 50 MG PO TABS
100.0000 mg | ORAL_TABLET | Freq: Two times a day (BID) | ORAL | Status: DC
  Administered 2017-05-12 – 2017-05-16 (×8): 100 mg via ORAL
  Filled 2017-05-12 (×8): qty 2

## 2017-05-12 MED ORDER — ACETAMINOPHEN 650 MG RE SUPP: 650.0000 mg | Freq: Four times a day (QID) | RECTAL | Status: DC | PRN

## 2017-05-12 MED ORDER — LORATADINE 10 MG PO TABS
10.0000 mg | ORAL_TABLET | Freq: Every day | ORAL | Status: DC
  Administered 2017-05-12 – 2017-05-16 (×5): 10 mg via ORAL
  Filled 2017-05-12 (×5): qty 1

## 2017-05-12 MED ORDER — ONDANSETRON HCL 4 MG/2ML IJ SOLN
4.0000 mg | Freq: Once | INTRAMUSCULAR | Status: AC
  Administered 2017-05-12: 4 mg via INTRAVENOUS
  Filled 2017-05-12: qty 2

## 2017-05-12 MED ORDER — PANTOPRAZOLE SODIUM 40 MG PO TBEC
40.0000 mg | DELAYED_RELEASE_TABLET | Freq: Every day | ORAL | Status: DC
  Administered 2017-05-12 – 2017-05-16 (×5): 40 mg via ORAL
  Filled 2017-05-12 (×5): qty 1

## 2017-05-12 MED ORDER — SODIUM CHLORIDE 0.9 % IV BOLUS (SEPSIS)
1000.0000 mL | Freq: Once | INTRAVENOUS | Status: AC
  Administered 2017-05-12: 1000 mL via INTRAVENOUS

## 2017-05-12 MED ORDER — POTASSIUM CHLORIDE IN NACL 20-0.9 MEQ/L-% IV SOLN
INTRAVENOUS | Status: AC
  Administered 2017-05-12: 23:00:00 via INTRAVENOUS
  Filled 2017-05-12: qty 1000

## 2017-05-12 MED ORDER — LATANOPROST 0.005 % OP SOLN
1.0000 [drp] | Freq: Every day | OPHTHALMIC | Status: DC
  Administered 2017-05-12 – 2017-05-15 (×4): 1 [drp] via OPHTHALMIC
  Filled 2017-05-12: qty 2.5

## 2017-05-12 MED ORDER — HYDROCHLOROTHIAZIDE 25 MG PO TABS
25.0000 mg | ORAL_TABLET | Freq: Every day | ORAL | Status: DC
  Administered 2017-05-12 – 2017-05-16 (×5): 25 mg via ORAL
  Filled 2017-05-12 (×5): qty 1

## 2017-05-12 MED ORDER — POTASSIUM CHLORIDE CRYS ER 20 MEQ PO TBCR
20.0000 meq | EXTENDED_RELEASE_TABLET | Freq: Two times a day (BID) | ORAL | Status: DC
  Administered 2017-05-12 – 2017-05-16 (×7): 20 meq via ORAL
  Filled 2017-05-12 (×7): qty 1

## 2017-05-12 MED ORDER — HYDRALAZINE HCL 20 MG/ML IJ SOLN
10.0000 mg | Freq: Four times a day (QID) | INTRAMUSCULAR | Status: DC | PRN
Start: 2017-05-12 — End: 2017-05-16

## 2017-05-12 MED ORDER — ACETAMINOPHEN 325 MG PO TABS: 650.0000 mg | ORAL_TABLET | Freq: Four times a day (QID) | ORAL | Status: DC | PRN

## 2017-05-12 MED ORDER — ANASTROZOLE 1 MG PO TABS
1.0000 mg | ORAL_TABLET | Freq: Every day | ORAL | Status: DC
  Administered 2017-05-13 – 2017-05-16 (×4): 1 mg via ORAL
  Filled 2017-05-12 (×5): qty 1

## 2017-05-12 MED ORDER — ASPIRIN EC 81 MG PO TBEC
81.0000 mg | DELAYED_RELEASE_TABLET | Freq: Every day | ORAL | Status: DC
  Administered 2017-05-12 – 2017-05-16 (×5): 81 mg via ORAL
  Filled 2017-05-12 (×5): qty 1

## 2017-05-12 MED ORDER — DORZOLAMIDE HCL-TIMOLOL MAL 2-0.5 % OP SOLN
1.0000 [drp] | Freq: Two times a day (BID) | OPHTHALMIC | Status: DC
  Administered 2017-05-12 – 2017-05-16 (×7): 1 [drp] via OPHTHALMIC
  Filled 2017-05-12: qty 10

## 2017-05-12 MED ORDER — ONDANSETRON HCL 4 MG PO TABS: 4.0000 mg | ORAL_TABLET | Freq: Four times a day (QID) | ORAL | Status: DC | PRN

## 2017-05-12 MED ORDER — ONDANSETRON HCL 4 MG/2ML IJ SOLN
4.0000 mg | Freq: Four times a day (QID) | INTRAMUSCULAR | Status: DC | PRN
Start: 2017-05-12 — End: 2017-05-16
  Administered 2017-05-14 – 2017-05-15 (×2): 4 mg via INTRAVENOUS

## 2017-05-12 MED ORDER — IBUPROFEN 400 MG PO TABS
400.0000 mg | ORAL_TABLET | Freq: Four times a day (QID) | ORAL | Status: DC | PRN
  Administered 2017-05-12 – 2017-05-15 (×4): 400 mg via ORAL
  Filled 2017-05-12 (×4): qty 1

## 2017-05-12 MED ORDER — AMLODIPINE BESYLATE 5 MG PO TABS
5.0000 mg | ORAL_TABLET | ORAL | Status: DC
  Administered 2017-05-13 – 2017-05-16 (×4): 5 mg via ORAL
  Filled 2017-05-12 (×4): qty 1

## 2017-05-12 MED ORDER — KETOROLAC TROMETHAMINE 30 MG/ML IJ SOLN
15.0000 mg | Freq: Once | INTRAMUSCULAR | Status: AC
  Administered 2017-05-12: 15 mg via INTRAVENOUS
  Filled 2017-05-12: qty 1

## 2017-05-12 MED ORDER — PRAVASTATIN SODIUM 20 MG PO TABS: 40.0000 mg | ORAL_TABLET | Freq: Every day | ORAL | Status: DC

## 2017-05-12 MED ORDER — POLYETHYLENE GLYCOL 3350 17 G PO PACK: 17.0000 g | PACK | Freq: Every day | ORAL | Status: DC | PRN

## 2017-05-12 NOTE — ED Provider Notes (Signed)
[facility] Emergency Department Provider Note  ____________________________________________   First MD Initiated Contact with Patient 05/12/17 1751     (approximate)  I have reviewed the triage vital signs and the nursing notes.   HISTORY  Chief Complaint Abdominal Pain   HPI Alyssa Duncan is a 77 y.o. female who self presents the emergency department with roughly 1 day of postprandial right upper quadrant abdominal pain nausea and one episode of vomiting.  No fevers or chills.  Symptoms began suddenly when she had a peanut butter and banana sandwich today.  No history of the same.  She denies diarrhea.  No constipation.  No fevers or chills.  No chest pain or shortness of breath.  The pain is worse with eating and improved when not eating.  The pain radiates from her right upper quadrant to her back.  Past Medical History:  Diagnosis Date  . Arthritis   . Asthma   . Back pain   . Breast cancer ([place]) 2013   left breast ductal and lobular carcinoma  . Dysrhythmia 12/2015   A-fib  . Family history of adverse reaction to anesthesia    dad had hard time waking up from anesthesia  . GERD (gastroesophageal reflux disease)   . Hyperlipidemia   . Hypertension   . Hypothyroidism   . Kidney stones 12/2015  . Personal history of radiation therapy 2013   BREAST CA  . Shingles   . Skin cancer 2005   left arm and nose  . Sleep apnea     Patient Active Problem List   Diagnosis Date Noted  . Gallstone pancreatitis 05/13/2017  . Acute biliary pancreatitis   . Cholelithiasis 05/12/2017  . 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 UTI (urinary tract infection) 12/07/2015  . Sepsis ([place]) 12/06/2015  . Bacteremia 12/06/2015  . Acute respiratory distress 12/06/2015  . New onset atrial fibrillation ([place]) 12/06/2015  . Acute delirium   . AKI (acute kidney injury) ([place])   . Hypokalemia   . Ureteral stone 12/03/2015  . Cholelithiasis  without cholecystitis 12/03/2015  . Nephrolithiasis 12/03/2015  . Allergic rhinitis 06/13/2015  . Arthritis 06/13/2015  . Asthma without status asthmaticus 06/13/2015  . Back ache 06/13/2015  . Malignant neoplastic disease ([place]) 06/13/2015  . Edema leg 06/13/2015  . Benign essential HTN 06/13/2015  . Acid reflux 06/13/2015  . Blood glucose elevated 06/13/2015  . HLD (hyperlipidemia) 06/13/2015  . Hypersomnia with sleep apnea 06/13/2015  . Adult hypothyroidism 06/13/2015  . Encounter for surgical follow-up care 05/17/2015  . History of repair of hip joint 05/17/2015    Past Surgical History:  Procedure Laterality Date  . BREAST BIOPSY Left 2013   POS  . BREAST LUMPECTOMY Left 2013   with rad tx  . CARPAL TUNNEL RELEASE    . CATARACT EXTRACTION    . COLONOSCOPY WITH PROPOFOL N/A 05/10/2015   Procedure: COLONOSCOPY WITH PROPOFOL;  Surgeon: Hulen Luster, MD;  Location: [facility] ENDOSCOPY;  Service: Gastroenterology;  Laterality: N/A;  . CYSTOSCOPY W/ RETROGRADES Bilateral 01/02/2016   Procedure: CYSTOSCOPY WITH RETROGRADE PYELOGRAM;  Surgeon: Hollice Espy, MD;  Location: [facility] ORS;  Service: Urology;  Laterality: Bilateral;  . CYSTOSCOPY W/ URETERAL STENT PLACEMENT Right 01/02/2016   Procedure: CYSTOSCOPY WITH STENT REPLACEMENT;  Surgeon: Hollice Espy, MD;  Location: [facility] ORS;  Service: Urology;  Laterality: Right;  . CYSTOSCOPY WITH STENT PLACEMENT Right 12/04/2015   Procedure: CYSTOSCOPY WITH STENT PLACEMENT;  Surgeon:  Hollice Espy, MD;  Location: [facility] ORS;  Service: Urology;  Laterality: Right;  . JOINT REPLACEMENT Left 2013   hip  . KNEE ARTHROSCOPY    . SEPTOPLASTY    . TRANSURETHRAL RESECTION OF BLADDER TUMOR WITH MITOMYCIN-C N/A 01/02/2016   Procedure: TRANSURETHRAL RESECTION OF BLADDER TUMOR WITH MITOMYCIN-C;  Surgeon: Hollice Espy, MD;  Location: [facility] ORS;  Service: Urology;  Laterality: N/A;  . URETEROSCOPY WITH HOLMIUM LASER LITHOTRIPSY Right 01/02/2016   Procedure:  URETEROSCOPY WITH HOLMIUM LASER LITHOTRIPSY;  Surgeon: Hollice Espy, MD;  Location: [facility] ORS;  Service: Urology;  Laterality: Right;  . UVULOPALATOPHARYNGOPLASTY     and tongue surgery    Prior to Admission medications   Medication Sig Start Date End Date Taking? Authorizing Provider  amLODipine (NORVASC) 5 MG tablet Take 5 mg by mouth every morning.    Yes [provider]  aspirin EC 81 MG tablet Take 1 tablet (81 mg total) by mouth daily. 02/15/16  Yes Wellington Hampshire, MD  Calcium Carb-Cholecalciferol (CALCIUM 600+D3) 600-800 MG-UNIT TABS Take 1 tablet by mouth 2 (two) times daily.   Yes [provider]  DORZOLAMIDE HCL-TIMOLOL MAL OP Place 1 drop into both eyes 2 (two) times daily.   Yes [provider]  hydrochlorothiazide (HYDRODIURIL) 25 MG tablet Take 25 mg by mouth daily.   Yes [provider]  ibuprofen (ADVIL,MOTRIN) 200 MG tablet Take 400 mg by mouth every 6 (six) hours as needed.    Yes [provider]  levothyroxine (SYNTHROID, LEVOTHROID) 75 MCG tablet Take 75 mcg by mouth daily before breakfast.   Yes [provider]  loratadine (CLARITIN) 10 MG tablet Take 10 mg by mouth daily.   Yes [provider]  losartan (COZAAR) 100 MG tablet Take 100 mg by mouth every morning.    Yes [provider]  lovastatin (MEVACOR) 40 MG tablet Take 40 mg by mouth at bedtime.   Yes [provider]  LUMIGAN 0.01 % SOLN Place 1 drop into both eyes at bedtime.  06/06/15  Yes [provider]  metoprolol tartrate (LOPRESSOR) 100 MG tablet Take 100 mg by mouth 2 (two) times daily.   Yes [provider]  pantoprazole (PROTONIX) 40 MG tablet Take 40 mg by mouth daily.   Yes [provider]  potassium chloride SA (K-DUR,KLOR-CON) 20 MEQ tablet Take 20 mEq by mouth 2 (two) times daily.   Yes [provider]  vitamin B-12 (CYANOCOBALAMIN) 1000 MCG tablet Take 1,000 mcg by mouth daily.   Yes  [provider]  vitamin C (ASCORBIC ACID) 500 MG tablet Take 500 mg by mouth daily.   Yes [provider]  anastrozole (ARIMIDEX) 1 MG tablet TAKE 1 TABLET DAILY 05/13/17   Karen Kitchens, NP    Allergies Ativan [lorazepam]; Cardura [doxazosin]; Clonidine derivatives; Enalapril; Levaquin [levofloxacin]; Mobic [meloxicam]; Sulfa antibiotics; and Vicodin [hydrocodone-acetaminophen]  Family History  Problem Relation Age of Onset  . Breast cancer Mother 59  . Kidney cancer Father   . Kidney Stones Brother   . Prostate cancer Neg Hx     Social History Social History   Tobacco Use  . Smoking status: Never Smoker  . Smokeless tobacco: Never Used  Substance Use Topics  . Alcohol use: No  . Drug use: No    Review of Systems Constitutional: No fever/chills Eyes: No visual changes. ENT: No sore throat. Cardiovascular: Denies chest pain. Respiratory: Denies shortness of breath. Gastrointestinal: Positive for abdominal pain.  Positive  for nausea, no vomiting.  No diarrhea.  No constipation. Genitourinary: Negative for dysuria. Musculoskeletal: Negative for back pain. Skin: Negative for rash. Neurological: Negative for headaches, focal weakness or numbness.   ____________________________________________   PHYSICAL EXAM:  VITAL SIGNS: ED Triage Vitals  Enc Vitals Group     BP 05/12/17 1555 117/78     Pulse Rate 05/12/17 1555 74     Resp 05/12/17 1555 18     Temp 05/12/17 1555 97.8 F (36.6 C)     Temp Source 05/12/17 1555 Oral     SpO2 05/12/17 1555 97 %     Weight 05/12/17 1556 200 lb (90.7 kg)     Height 05/12/17 1556 5\' 2"  (1.575 m)     Head Circumference --      Peak Flow --      Pain Score 05/12/17 1556 4     Pain Loc --      Pain Edu? --      Excl. in [place]? --     Constitutional: Alert and oriented x4 somewhat uncomfortable appearing nontoxic no diaphoresis speaks in full clear sentences Eyes: PERRL EOMI. Head: Atraumatic. Nose: No  congestion/rhinnorhea. Mouth/Throat: No trismus Neck: No stridor.   Cardiovascular: Normal rate, regular rhythm. Grossly normal heart sounds.  Good peripheral circulation. Respiratory: Normal respiratory effort.  No retractions. Lungs CTAB and moving good air Gastrointestinal: Soft nondistended she is tender in the right upper quadrant with no Murphy's.  No rebound or guarding no peritonitis Musculoskeletal: No lower extremity edema   Neurologic:  Normal speech and language. No gross focal neurologic deficits are appreciated. Skin:  Skin is warm, dry and intact. No rash noted. Psychiatric: Mood and affect are normal. Speech and behavior are normal.    ____________________________________________   DIFFERENTIAL includes but not limited to  Biliary colic, cholecystitis, cholangitis, choledocholithiasis, appendicitis, diverticulitis, bowel obstruction ____________________________________________   LABS (all labs ordered are listed, but only abnormal results are displayed)  Labs Reviewed  LIPASE, BLOOD - Abnormal; Notable for the following components:      Result Value   Lipase 113 (*)    All other components within normal limits  COMPREHENSIVE METABOLIC PANEL - Abnormal; Notable for the following components:   Chloride 98 (*)    Glucose, Bld 169 (*)    Creatinine, Ser 1.17 (*)    AST 155 (*)    ALT 167 (*)    Alkaline Phosphatase 225 (*)    Total Bilirubin 1.6 (*)    GFR calc non Af Amer 43 (*)    GFR calc Af Amer 50 (*)    All other components within normal limits  URINALYSIS, COMPLETE (UACMP) WITH MICROSCOPIC - Abnormal; Notable for the following components:   Color, Urine YELLOW (*)    APPearance CLEAR (*)    Hgb urine dipstick MODERATE (*)    Leukocytes, UA MODERATE (*)    Bacteria, UA RARE (*)    Squamous Epithelial / LPF 0-5 (*)    All other components within normal limits  COMPREHENSIVE METABOLIC PANEL - Abnormal; Notable for the following components:   Glucose,  Bld 118 (*)    Calcium 8.8 (*)    Albumin 3.4 (*)    AST 220 (*)    ALT 264 (*)    Alkaline Phosphatase 255 (*)    Total Bilirubin 3.8 (*)    All other components within normal limits  CBC  CBC  LIPASE, BLOOD    Lab work reviewed by me with  elevated transaminases concerning for obstruction __________________________________________  EKG ED ECG REPORT I, Darel Hong, the attending physician, personally viewed and interpreted this ECG.  Date: 05/13/2017 EKG Time:  Rate: 75 Rhythm: normal sinus rhythm QRS Axis: normal Intervals: normal ST/T Wave abnormalities: normal Narrative Interpretation: no evidence of acute ischemia  ____________________________________________  RADIOLOGY  Right upper quadrant ultrasound reviewed by me shows gallstones with no evidence of cholecystitis ____________________________________________   PROCEDURES  Procedure(s) performed: no  Procedures  Critical Care performed: no  Observation: no ____________________________________________   INITIAL IMPRESSION / ASSESSMENT AND PLAN / ED COURSE  Pertinent labs & imaging results that were available during my care of the patient were reviewed by me and considered in my medical decision making (see chart for details).      ----------------------------------------- 6:28 PM on 05/12/2017 -----------------------------------------  Discussed the case with on-call general surgeon Dr. Rosana Hoes who recommends inpatient admission to surgical consultation.  He recommends the patient be made n.p.o. and give a dose of Toradol.  Concern is that the patient may have recently passed a stone although her bilirubin very well may be on the rise instead of getting better.  I discussed with the family who agrees with admission.  I then discussed with the hospitalist who is graciously agreed to admit the patient to his service.  The patient will be n.p.o. for  now.  ____________________________________________   FINAL CLINICAL IMPRESSION(S) / ED DIAGNOSES  Final diagnoses:  Pain  Choledocholithiasis  Acute biliary pancreatitis, unspecified complication status      NEW MEDICATIONS STARTED DURING THIS VISIT:  Current Discharge Medication List       Note:  This document was prepared using Dragon voice recognition software and may include unintentional dictation errors.     Darel Hong, MD 05/13/17 1724

## 2017-05-12 NOTE — ED Notes (Signed)
Admitting at bedside 

## 2017-05-12 NOTE — ED Notes (Signed)
Patient transported to 214 

## 2017-05-12 NOTE — ED Notes (Signed)
Pt c/o RUQ belly pain with N&V that began today around 2pm after eating peanut butter banana sandwich. Pt denies having gallbladder problems in past. Alert, oriented, no distress noted currently. Husband at bedside.

## 2017-05-12 NOTE — Telephone Encounter (Signed)
Pt c/o pain underneath the rib cage & asks if she should go to the ER. Per Dr Erlene Quan, pt was advised that a CT can be ordered if the pain is tolerable, if pain is severe she should go to the ER. Pt states she will go to the ER. Dr Erlene Quan aware.

## 2017-05-12 NOTE — ED Notes (Signed)
Family states she is getting more nauseated

## 2017-05-12 NOTE — ED Triage Notes (Signed)
Pt comes into the ED via EMS from home with c/o RUQ pain with nausea since 2pm today about 43min after eating lunch.Marland Kitchen

## 2017-05-12 NOTE — ED Notes (Signed)
Surgeon at bedside.  

## 2017-05-12 NOTE — ED Notes (Signed)
First nurse note  Presents to ED via ems states she had a sharp pain under right breat ..and now having pain to back   ..states pain started about 2 pm

## 2017-05-12 NOTE — H&P (Signed)
[facility] [place] NAME: Alyssa Duncan    MR#:  [id number]  DATE OF BIRTH:  08/27/1940  DATE OF ADMISSION:  05/12/2017  PRIMARY CARE PHYSICIAN: Adin Hector, MD   REQUESTING/REFERRING PHYSICIAN: Dr. Mable Paris  CHIEF COMPLAINT:   Chief Complaint  Patient presents with  . Abdominal Pain    HISTORY OF PRESENT ILLNESS:  Alyssa Duncan  is a 77 y.o. female with a known history of A. fib, hypertension, history of breast cancer presents to the hospital complaining of acute onset of right upper quadrant pain, nausea after eating a meal.  Patient had milder symptoms last week after a meal which resolved on its own with taking Tums.  Today patient presented to the ER has elevated LFTs, bilirubin.  Ultrasound of the abdomen showed multiple gallstones.  CBD 6 mm.  Patient is being admitted for observation.  PAST MEDICAL HISTORY:   Past Medical History:  Diagnosis Date  . Arthritis   . Asthma   . Back pain   . Breast cancer ([place]) 2013   left breast ductal and lobular carcinoma  . Dysrhythmia 12/2015   A-fib  . Family history of adverse reaction to anesthesia    dad had hard time waking up from anesthesia  . GERD (gastroesophageal reflux disease)   . Hyperlipidemia   . Hypertension   . Hypothyroidism   . Kidney stones 12/2015  . Personal history of radiation therapy 2013   BREAST CA  . Shingles   . Skin cancer 2005   left arm and nose  . Sleep apnea     PAST SURGICAL HISTORY:   Past Surgical History:  Procedure Laterality Date  . BREAST BIOPSY Left 2013   POS  . BREAST LUMPECTOMY Left 2013   with rad tx  . CARPAL TUNNEL RELEASE    . CATARACT EXTRACTION    . COLONOSCOPY WITH PROPOFOL N/A 05/10/2015   Procedure: COLONOSCOPY WITH PROPOFOL;  Surgeon: Hulen Luster, MD;  Location: [facility] ENDOSCOPY;  Service: Gastroenterology;  Laterality: N/A;  . CYSTOSCOPY W/ RETROGRADES Bilateral 01/02/2016   Procedure: CYSTOSCOPY WITH RETROGRADE  PYELOGRAM;  Surgeon: Hollice Espy, MD;  Location: [facility] ORS;  Service: Urology;  Laterality: Bilateral;  . CYSTOSCOPY W/ URETERAL STENT PLACEMENT Right 01/02/2016   Procedure: CYSTOSCOPY WITH STENT REPLACEMENT;  Surgeon: Hollice Espy, MD;  Location: [facility] ORS;  Service: Urology;  Laterality: Right;  . CYSTOSCOPY WITH STENT PLACEMENT Right 12/04/2015   Procedure: CYSTOSCOPY WITH STENT PLACEMENT;  Surgeon: Hollice Espy, MD;  Location: [facility] ORS;  Service: Urology;  Laterality: Right;  . JOINT REPLACEMENT Left 2013   hip  . KNEE ARTHROSCOPY    . SEPTOPLASTY    . TRANSURETHRAL RESECTION OF BLADDER TUMOR WITH MITOMYCIN-C N/A 01/02/2016   Procedure: TRANSURETHRAL RESECTION OF BLADDER TUMOR WITH MITOMYCIN-C;  Surgeon: Hollice Espy, MD;  Location: [facility] ORS;  Service: Urology;  Laterality: N/A;  . URETEROSCOPY WITH HOLMIUM LASER LITHOTRIPSY Right 01/02/2016   Procedure: URETEROSCOPY WITH HOLMIUM LASER LITHOTRIPSY;  Surgeon: Hollice Espy, MD;  Location: [facility] ORS;  Service: Urology;  Laterality: Right;  . UVULOPALATOPHARYNGOPLASTY     and tongue surgery    SOCIAL HISTORY:   Social History   Tobacco Use  . Smoking status: Never Smoker  . Smokeless tobacco: Never Used  Substance Use Topics  . Alcohol use: No    FAMILY HISTORY:   Family History  Problem Relation Age of Onset  . Breast cancer Mother 20  .  Kidney cancer Father   . Kidney Stones Brother   . Prostate cancer Neg Hx     DRUG ALLERGIES:   Allergies  Allergen Reactions  . Ativan [Lorazepam]     Hysteria   . Cardura [Doxazosin] Other (See Comments)    unsure  . Clonidine Derivatives Other (See Comments)    unsure  . Enalapril Other (See Comments)    unsure  . Levaquin [Levofloxacin] Other (See Comments)    Couldn't raise her arms  . Mobic [Meloxicam] Other (See Comments)    unsure  . Sulfa Antibiotics Nausea Only  . Vicodin [Hydrocodone-Acetaminophen] Other (See Comments)    unsure    REVIEW OF SYSTEMS:    Review of Systems  Constitutional: Positive for malaise/fatigue. Negative for chills and fever.  HENT: Negative for sore throat.   Eyes: Negative for blurred vision, double vision and pain.  Respiratory: Negative for cough, hemoptysis, shortness of breath and wheezing.   Cardiovascular: Negative for chest pain, palpitations, orthopnea and leg swelling.  Gastrointestinal: Positive for abdominal pain and nausea. Negative for constipation, diarrhea, heartburn and vomiting.  Genitourinary: Negative for dysuria and hematuria.  Musculoskeletal: Negative for back pain and joint pain.  Skin: Negative for rash.  Neurological: Positive for weakness. Negative for sensory change, speech change, focal weakness and headaches.  Endo/Heme/Allergies: Does not bruise/bleed easily.  Psychiatric/Behavioral: Negative for depression. The patient is not nervous/anxious.     MEDICATIONS AT HOME:   Prior to Admission medications   Medication Sig Start Date End Date Taking? Authorizing Provider  amLODipine (NORVASC) 5 MG tablet Take 5 mg by mouth every morning.    Yes [provider]  anastrozole (ARIMIDEX) 1 MG tablet Take 1 tablet (1 mg total) by mouth daily. 04/20/17  Yes Karen Kitchens, NP  aspirin EC 81 MG tablet Take 1 tablet (81 mg total) by mouth daily. 02/15/16  Yes Wellington Hampshire, MD  Calcium Carb-Cholecalciferol (CALCIUM 600+D3) 600-800 MG-UNIT TABS Take 1 tablet by mouth 2 (two) times daily.   Yes [provider]  DORZOLAMIDE HCL-TIMOLOL MAL OP Place 1 drop into both eyes 2 (two) times daily.   Yes [provider]  hydrochlorothiazide (HYDRODIURIL) 25 MG tablet Take 25 mg by mouth daily.   Yes [provider]  ibuprofen (ADVIL,MOTRIN) 200 MG tablet Take 400 mg by mouth every 6 (six) hours as needed.    Yes [provider]  levothyroxine (SYNTHROID, LEVOTHROID) 75 MCG tablet Take 75 mcg by mouth daily before breakfast.   Yes [provider]   loratadine (CLARITIN) 10 MG tablet Take 10 mg by mouth daily.   Yes [provider]  losartan (COZAAR) 100 MG tablet Take 100 mg by mouth every morning.    Yes [provider]  lovastatin (MEVACOR) 40 MG tablet Take 40 mg by mouth at bedtime.   Yes [provider]  LUMIGAN 0.01 % SOLN Place 1 drop into both eyes at bedtime.  06/06/15  Yes [provider]  metoprolol tartrate (LOPRESSOR) 100 MG tablet Take 100 mg by mouth 2 (two) times daily.   Yes [provider]  pantoprazole (PROTONIX) 40 MG tablet Take 40 mg by mouth daily.   Yes [provider]  potassium chloride SA (K-DUR,KLOR-CON) 20 MEQ tablet Take 20 mEq by mouth 2 (two) times daily.   Yes [provider]  vitamin B-12 (CYANOCOBALAMIN) 1000 MCG tablet Take 1,000 mcg by mouth daily.   Yes [provider]  vitamin C (ASCORBIC  ACID) 500 MG tablet Take 500 mg by mouth daily.   Yes [provider]     VITAL SIGNS:  Blood pressure (!) 163/62, pulse 91, temperature 97.8 F (36.6 C), temperature source Oral, resp. rate 16, height 5\' 2"  (1.575 m), weight 90.7 kg (200 lb), SpO2 98 %.  PHYSICAL EXAMINATION:  Physical Exam  GENERAL:  77 y.o.-year-old patient lying in the bed with no acute distress.  EYES: Pupils equal, round, reactive to light and accommodation. No scleral icterus. Extraocular muscles intact.  HEENT: Head atraumatic, normocephalic. Oropharynx and nasopharynx clear. No oropharyngeal erythema, moist oral mucosa  NECK:  Supple, no jugular venous distention. No thyroid enlargement, no tenderness.  LUNGS: Normal breath sounds bilaterally, no wheezing, rales, rhonchi. No use of accessory muscles of respiration.  CARDIOVASCULAR: S1, S2 normal. No murmurs, rubs, or gallops.  ABDOMEN: Soft, nontender, nondistended. Bowel sounds present. No organomegaly or mass.  EXTREMITIES: No pedal edema, cyanosis, or clubbing. + 2 pedal & radial pulses b/l.    NEUROLOGIC: Cranial nerves II through XII are intact. No focal Motor or sensory deficits appreciated b/l PSYCHIATRIC: The patient is alert and oriented x 3. Good affect.  SKIN: No obvious rash, lesion, or ulcer.   LABORATORY PANEL:   CBC Recent Labs  Lab 05/12/17 1554  WBC 8.1  HGB 14.9  HCT 43.9  PLT 319   ------------------------------------------------------------------------------------------------------------------  Chemistries  Recent Labs  Lab 05/12/17 1554  NA 140  K 3.5  CL 98*  CO2 29  GLUCOSE 169*  BUN 18  CREATININE 1.17*  CALCIUM 9.4  AST 155*  ALT 167*  ALKPHOS 225*  BILITOT 1.6*   ------------------------------------------------------------------------------------------------------------------  Cardiac Enzymes No results for input(s): TROPONINI in the last 168 hours. ------------------------------------------------------------------------------------------------------------------  RADIOLOGY:  US Abdomen Limited Ruq  Result Date: 05/12/2017 CLINICAL DATA:  77 year old female with right upper quadrant pain and nausea. Initial encounter. EXAM: ULTRASOUND ABDOMEN LIMITED RIGHT UPPER QUADRANT COMPARISON:  12/03/2015 CT. FINDINGS: Gallbladder: Multiple mobile gallstones measuring up to 1.1 cm. No gallbladder wall thickening or pericholecystic fluid. Patient was not tender over this region during scanning per ultrasound technologist. Common bile duct: Diameter: 6 mm Liver: Diffuse fatty infiltration with focal sparing adjacent to the gallbladder fossa. Portal vein is patent on color Doppler imaging with normal direction of blood flow towards the liver. IMPRESSION: Multiple mobile gallstones measuring up to 1.1 cm without ultrasound findings to suggest acute cholecystitis. Fatty liver with focal fatty sparing adjacent to the gallbladder fossa. Electronically Signed   By: Genia Del M.D.   On: 05/12/2017 16:59     IMPRESSION AND PLAN:   *Cholelithiasis with  elevated LFTs.  Likely has symptomatic gallstones.  With normal CBD unlikely to have a stone there.  Will repeat LFTs in the morning.  Discussed with Dr. Rosana Hoes of surgery.  If LFTs are trending down can follow-up in the office for cholecystectomy.  Added nausea and pain medications as needed.  Low-fat diet.  *Atrial fibrillation.  Continue aspirin and rate control medications.  *Hypertension.  On home medications.  Add IV hydralazine.  DVT prophylaxis with Lovenox  All the records are reviewed and case discussed with ED provider. Management plans discussed with the patient, family and they are in agreement.  CODE STATUS: Full code  TOTAL TIME TAKING CARE OF THIS PATIENT: 40 minutes.   Leia Alf Bland Rudzinski M.D on 05/12/2017 at 7:32 PM  Between 7am to 6pm - Pager - [phone]  After 6pm go to [email or web address] - password [email or web address]  [facility]  Office  [phone]  CC: Primary care physician; Adin Hector, MD  Note: This dictation was prepared with Dragon dictation along with smaller phrase technology. Any transcriptional errors that result from this process are unintentional.

## 2017-05-13 ENCOUNTER — Encounter
Admission: EM | Disposition: A | Discharge status: Home / Self Care | Payer: Self-pay | Source: Home / Self Care | Attending: Internal Medicine

## 2017-05-13 ENCOUNTER — Other Ambulatory Visit: Payer: Self-pay | Admitting: Urgent Care

## 2017-05-13 ENCOUNTER — Inpatient Hospital Stay: Payer: Medicare Other

## 2017-05-13 DIAGNOSIS — Z87442 Personal history of urinary calculi: Secondary | ICD-10-CM | POA: Diagnosis not present

## 2017-05-13 DIAGNOSIS — K8066 Calculus of gallbladder and bile duct with acute and chronic cholecystitis without obstruction: Secondary | ICD-10-CM | POA: Diagnosis present

## 2017-05-13 DIAGNOSIS — G4733 Obstructive sleep apnea (adult) (pediatric): Secondary | ICD-10-CM | POA: Diagnosis present

## 2017-05-13 DIAGNOSIS — K8063 Calculus of gallbladder and bile duct with acute cholecystitis with obstruction: Secondary | ICD-10-CM | POA: Diagnosis not present

## 2017-05-13 DIAGNOSIS — K851 Biliary acute pancreatitis without necrosis or infection: Secondary | ICD-10-CM | POA: Diagnosis present

## 2017-05-13 DIAGNOSIS — J45909 Unspecified asthma, uncomplicated: Secondary | ICD-10-CM | POA: Diagnosis present

## 2017-05-13 DIAGNOSIS — E785 Hyperlipidemia, unspecified: Secondary | ICD-10-CM | POA: Diagnosis present

## 2017-05-13 DIAGNOSIS — I4891 Unspecified atrial fibrillation: Secondary | ICD-10-CM | POA: Diagnosis present

## 2017-05-13 DIAGNOSIS — Z853 Personal history of malignant neoplasm of breast: Secondary | ICD-10-CM | POA: Diagnosis not present

## 2017-05-13 DIAGNOSIS — K8020 Calculus of gallbladder without cholecystitis without obstruction: Secondary | ICD-10-CM

## 2017-05-13 DIAGNOSIS — Z96642 Presence of left artificial hip joint: Secondary | ICD-10-CM | POA: Diagnosis present

## 2017-05-13 DIAGNOSIS — K8071 Calculus of gallbladder and bile duct without cholecystitis with obstruction: Secondary | ICD-10-CM

## 2017-05-13 DIAGNOSIS — Z923 Personal history of irradiation: Secondary | ICD-10-CM | POA: Diagnosis not present

## 2017-05-13 DIAGNOSIS — I10 Essential (primary) hypertension: Secondary | ICD-10-CM | POA: Diagnosis present

## 2017-05-13 DIAGNOSIS — K8050 Calculus of bile duct without cholangitis or cholecystitis without obstruction: Secondary | ICD-10-CM | POA: Diagnosis not present

## 2017-05-13 DIAGNOSIS — E039 Hypothyroidism, unspecified: Secondary | ICD-10-CM | POA: Diagnosis present

## 2017-05-13 DIAGNOSIS — R1011 Right upper quadrant pain: Secondary | ICD-10-CM | POA: Diagnosis present

## 2017-05-13 DIAGNOSIS — K219 Gastro-esophageal reflux disease without esophagitis: Secondary | ICD-10-CM | POA: Diagnosis present

## 2017-05-13 DIAGNOSIS — Z7982 Long term (current) use of aspirin: Secondary | ICD-10-CM | POA: Diagnosis not present

## 2017-05-13 DIAGNOSIS — Z8719 Personal history of other diseases of the digestive system: Secondary | ICD-10-CM | POA: Insufficient documentation

## 2017-05-13 DIAGNOSIS — Z85828 Personal history of other malignant neoplasm of skin: Secondary | ICD-10-CM | POA: Diagnosis not present

## 2017-05-13 DIAGNOSIS — Z79899 Other long term (current) drug therapy: Secondary | ICD-10-CM | POA: Diagnosis not present

## 2017-05-13 HISTORY — DX: Biliary acute pancreatitis without necrosis or infection: K85.10

## 2017-05-13 LAB — CBC
HCT: 40.8 % (ref 35.0–47.0)
Hemoglobin: 13.7 g/dL (ref 12.0–16.0)
MCH: 31.2 pg (ref 26.0–34.0)
MCHC: 33.6 g/dL (ref 32.0–36.0)
MCV: 92.9 fL (ref 80.0–100.0)
PLATELETS: 248 10*3/uL (ref 150–440)
RBC: 4.39 MIL/uL (ref 3.80–5.20)
RDW: 13.6 % (ref 11.5–14.5)
WBC: 10.2 10*3/uL (ref 3.6–11.0)

## 2017-05-13 LAB — COMPREHENSIVE METABOLIC PANEL
ALK PHOS: 255 U/L — AB (ref 38–126)
ALT: 264 U/L — AB (ref 14–54)
AST: 220 U/L — AB (ref 15–41)
Albumin: 3.4 g/dL — ABNORMAL LOW (ref 3.5–5.0)
Anion gap: 12 (ref 5–15)
BILIRUBIN TOTAL: 3.8 mg/dL — AB (ref 0.3–1.2)
BUN: 15 mg/dL (ref 6–20)
CHLORIDE: 102 mmol/L (ref 101–111)
CO2: 28 mmol/L (ref 22–32)
CREATININE: 0.88 mg/dL (ref 0.44–1.00)
Calcium: 8.8 mg/dL — ABNORMAL LOW (ref 8.9–10.3)
GFR calc Af Amer: >60 mL/min (ref >60)
Glucose, Bld: 118 mg/dL — ABNORMAL HIGH (ref 65–99)
Potassium: 3.6 mmol/L (ref 3.5–5.1)
Sodium: 142 mmol/L (ref 135–145)
Total Protein: 7 g/dL (ref 6.5–8.1)

## 2017-05-13 LAB — URINALYSIS, COMPLETE (UACMP) WITH MICROSCOPIC
Bilirubin Urine: NEGATIVE
Glucose, UA: NEGATIVE mg/dL
Ketones, ur: NEGATIVE mg/dL
Nitrite: NEGATIVE
PH: 7 (ref 5.0–8.0)
PROTEIN: NEGATIVE mg/dL
SPECIFIC GRAVITY, URINE: 1.006 (ref 1.005–1.030)

## 2017-05-13 LAB — LIPASE, BLOOD: Lipase: 46 U/L (ref 11–51)

## 2017-05-13 SURGERY — LAPAROSCOPIC CHOLECYSTECTOMY WITH INTRAOPERATIVE CHOLANGIOGRAM
Anesthesia: Choice

## 2017-05-13 MED ORDER — DIPHENHYDRAMINE HCL 25 MG PO CAPS
25.0000 mg | ORAL_CAPSULE | Freq: Two times a day (BID) | ORAL | Status: DC | PRN
  Administered 2017-05-13: 25 mg via ORAL

## 2017-05-13 MED ORDER — DEXTROSE 5 % IV SOLN
2.0000 g | Freq: Every day | INTRAVENOUS | Status: DC
  Administered 2017-05-13: 2 g via INTRAVENOUS
  Filled 2017-05-13 (×2): qty 2

## 2017-05-13 MED ORDER — DIPHENHYDRAMINE HCL 25 MG PO CAPS
ORAL_CAPSULE | ORAL | Status: AC
  Filled 2017-05-13: qty 1

## 2017-05-13 MED ORDER — GADOBENATE DIMEGLUMINE 529 MG/ML IV SOLN
20.0000 mL | Freq: Once | INTRAVENOUS | Status: AC | PRN
  Administered 2017-05-13: 19 mL via INTRAVENOUS

## 2017-05-13 NOTE — Progress Notes (Addendum)
SURGICAL PROGRESS NOTE (cpt [phone])  Hospital Day(s): 0.   Post op day(s):  Marland Kitchen   Interval History: Patient seen and examined, no acute events or new complaints since admission overnight. Patient reports RUQ abdominal pain, fevers, and nausea without emesis this past Thursday night, 4 days prior to yesterday's ED presentation. By Friday morning, she felt better without pain or N/V until severe RUQ abdominal pain developed yesterday, prompting her ED presentation. She denies any prior similar symptoms or any N/V, fever/chills, CP, or SOB. However, patient states she is unable to walk from the parking lot to the hospital or up a single flight of steps without SOB without CP. She states she underwent a stress test for her SOB >2 - 3 years ago, but does not recall the results.  Review of Systems:  Constitutional: denies fever, chills  HEENT: denies cough or congestion  Respiratory: denies any shortness of breath  Cardiovascular: denies chest pain or palpitations  Gastrointestinal: abdominal pain, N/V, and bowel function as per interval history Genitourinary: denies burning with urination or urinary frequency Musculoskeletal: denies pain, decreased motor or sensation Integumentary: denies any other rashes or skin discolorations Neurological: denies HA or vision/hearing changes   Vital signs in last 24 hours: [min-max] current  Temp:  [97.8 F (36.6 C)-99.8 F (37.7 C)] 98.4 F (36.9 C) (02/06 0502) Pulse Rate:  [74-94] 77 (02/06 0502) Resp:  [16-20] 20 (02/06 0502) BP: (117-164)/(61-78) 153/61 (02/06 0502) SpO2:  [93 %-98 %] 97 % (02/06 0502) Weight:  [200 lb (90.7 kg)-200 lb 6.4 oz (90.9 kg)] 200 lb 6.4 oz (90.9 kg) (02/05 2157)     Height: 5\' 2"  (157.5 cm) Weight: 200 lb 6.4 oz (90.9 kg) BMI (Calculated): 36.64   Intake/Output this shift:  No intake/output data recorded.   Intake/Output last 2 shifts:  @IOLAST2SHIFTS @   Physical Exam:  Constitutional: alert, cooperative and no  distress  HENT: normocephalic without obvious abnormality  Eyes: PERRL, EOM's grossly intact and symmetric  Neuro: CN II - XII grossly intact and symmetric without deficit  Respiratory: breathing non-labored at rest  Cardiovascular: regular rate and sinus rhythm  Gastrointestinal: soft, non-tender, and non-distended Musculoskeletal: UE and LE FROM, no edema or wounds, motor and sensation grossly intact, NT   Labs:  CBC Latest Ref Rng & Units 05/13/2017 05/12/2017 12/18/2016  WBC 3.6 - 11.0 K/uL 10.2 8.1 8.3  Hemoglobin 12.0 - 16.0 g/dL 13.7 14.9 14.7  Hematocrit 35.0 - 47.0 % 40.8 43.9 42.5  Platelets 150 - 440 K/uL 248 319 309   CMP Latest Ref Rng & Units 05/13/2017 05/12/2017 12/18/2016  Glucose 65 - 99 mg/dL 118(H) 169(H) 122(H)  BUN 6 - 20 mg/dL 15 18 18   Creatinine 0.44 - 1.00 mg/dL 0.88 1.17(H) 1.08(H)  Sodium 135 - 145 mmol/L 142 140 139  Potassium 3.5 - 5.1 mmol/L 3.6 3.5 3.9  Chloride 101 - 111 mmol/L 102 98(L) 99(L)  CO2 22 - 32 mmol/L 28 29 30   Calcium 8.9 - 10.3 mg/dL 8.8(L) 9.4 9.5  Total Protein 6.5 - 8.1 g/dL 7.0 7.5 7.5  Total Bilirubin 0.3 - 1.2 mg/dL 3.8(H) 1.6(H) 0.7  Alkaline Phos 38 - 126 U/L 255(H) 225(H) 93  AST 15 - 41 U/L 220(H) 155(H) 34  ALT 14 - 54 U/L 264(H) 167(H) 32   Lipase (05/13/2017): 46 (from 113)  Imaging studies: No new pertinent imaging studies, though admission ultrasound personally reviewed and discussed with patient   Assessment/Plan: (ICD-10's: K80.71, K85.10) 77 y.o. female with  choledocholithiasis and mild gallstone pancreatitis with further elevated hyperbilirubinemia and transaminasemia this morning despite improved lipase and no significant ductal dilatation on RUQ abdominal ultrasound, complicated by pertinent comorbidities including HTN, HLD, history of atrial fibrillation no longer on therapeutic anticoagulation per cardiology, hypothyroidism, nephrolithiasis, OSA, GERD, and a history of Left breast cancer and skin cancer (Left arm and nose,  not otherwise specified).   - NPO for now with IV fluids and antibiotics  - follow-up/trend repeat LFT's tomorrow morning  - GI consultation for consideration of ERCP vs MRCP  - medical risk stratification and optimization for anticipated cholecystectomy prior to discharge  - medical management of comorbidities as per primary medical team  - DVT prophylaxis, ambulation encouraged  - please call if any questions  All of the above findings and recommendations were discussed with the patient, patient's family, medical physician, and patient's RN, and all of patient's and family's questions were answered to their expressed satisfaction.  Thank you for the opportunity to participate in this patient's care.  -- Marilynne Drivers Rosana Hoes, MD, [place]: [facility] - Partnering for exceptional care. Office: [phone]

## 2017-05-13 NOTE — Consult Note (Signed)
Alyssa Lame, MD [facility]  [address], [place], [place] [address] Phone: [phone] Fax : [phone]  Consultation  Referring Provider:     Dr. Posey Pronto Primary Care Physician:  Adin Hector, MD Primary Gastroenterologist:  Althia Forts         Reason for Consultation:     Abnormal liver enzymes and abdominal pain  Date of Admission:  05/12/2017 Date of Consultation:  05/13/2017         HPI:   Alyssa Duncan is a 77 y.o. female who reports that at the end of last week she had some severe nausea with what she states to be the feeling of a bubble on her right side. The patient states that her symptoms had resolved on its own but did not change when she took Tums or Zantac. The patient then presented to the emergency room yesterday because of severe abdominal pain in the right upper quadrant underneath her ribs and nausea. The patient states that her symptoms were present all day then resolve that at approximately 8:00 at night. The patient had a ultrasound of the right upper quadrant in the ER. The ultrasound showed diffuse fatty infiltration with focal sparing adjacent to the gallbladder fossa. There were multiple mobile gallstones with the largest measuring 1.1 cm in the gallbladder. The patient also reports that after the nausea started she had a bowel movement which she states appear to have blood in it although she cannot be sure if there was some blood in her urine because she has a history of kidney stones. She spoke to her urologist who recommended her to go to the ER which precipitated her admission to the hospital. The patient's lipase was normal but her LFTs showed the AST to be 155 that went up to 210 today. The patient's bilirubin went from 1.6-3.8 today. Her alkaline phosphatase was also elevated this morning at 255 with the ALT of 264. The patient is completely asymptomatic at this time.  Past Medical History:  Diagnosis Date  . Arthritis   . Asthma   . Back pain   .  Breast cancer ([place]) 2013   left breast ductal and lobular carcinoma  . Dysrhythmia 12/2015   A-fib  . Family history of adverse reaction to anesthesia    dad had hard time waking up from anesthesia  . GERD (gastroesophageal reflux disease)   . Hyperlipidemia   . Hypertension   . Hypothyroidism   . Kidney stones 12/2015  . Personal history of radiation therapy 2013   BREAST CA  . Shingles   . Skin cancer 2005   left arm and nose  . Sleep apnea     Past Surgical History:  Procedure Laterality Date  . BREAST BIOPSY Left 2013   POS  . BREAST LUMPECTOMY Left 2013   with rad tx  . CARPAL TUNNEL RELEASE    . CATARACT EXTRACTION    . COLONOSCOPY WITH PROPOFOL N/A 05/10/2015   Procedure: COLONOSCOPY WITH PROPOFOL;  Surgeon: Hulen Luster, MD;  Location: [facility] ENDOSCOPY;  Service: Gastroenterology;  Laterality: N/A;  . CYSTOSCOPY W/ RETROGRADES Bilateral 01/02/2016   Procedure: CYSTOSCOPY WITH RETROGRADE PYELOGRAM;  Surgeon: Hollice Espy, MD;  Location: [facility] ORS;  Service: Urology;  Laterality: Bilateral;  . CYSTOSCOPY W/ URETERAL STENT PLACEMENT Right 01/02/2016   Procedure: CYSTOSCOPY WITH STENT REPLACEMENT;  Surgeon: Hollice Espy, MD;  Location: [facility] ORS;  Service: Urology;  Laterality: Right;  . CYSTOSCOPY WITH STENT PLACEMENT Right 12/04/2015  Procedure: CYSTOSCOPY WITH STENT PLACEMENT;  Surgeon: Hollice Espy, MD;  Location: [facility] ORS;  Service: Urology;  Laterality: Right;  . JOINT REPLACEMENT Left 2013   hip  . KNEE ARTHROSCOPY    . SEPTOPLASTY    . TRANSURETHRAL RESECTION OF BLADDER TUMOR WITH MITOMYCIN-C N/A 01/02/2016   Procedure: TRANSURETHRAL RESECTION OF BLADDER TUMOR WITH MITOMYCIN-C;  Surgeon: Hollice Espy, MD;  Location: [facility] ORS;  Service: Urology;  Laterality: N/A;  . URETEROSCOPY WITH HOLMIUM LASER LITHOTRIPSY Right 01/02/2016   Procedure: URETEROSCOPY WITH HOLMIUM LASER LITHOTRIPSY;  Surgeon: Hollice Espy, MD;  Location: [facility] ORS;  Service: Urology;  Laterality:  Right;  . UVULOPALATOPHARYNGOPLASTY     and tongue surgery    Prior to Admission medications   Medication Sig Start Date End Date Taking? Authorizing Provider  amLODipine (NORVASC) 5 MG tablet Take 5 mg by mouth every morning.    Yes [provider]  aspirin EC 81 MG tablet Take 1 tablet (81 mg total) by mouth daily. 02/15/16  Yes Wellington Hampshire, MD  Calcium Carb-Cholecalciferol (CALCIUM 600+D3) 600-800 MG-UNIT TABS Take 1 tablet by mouth 2 (two) times daily.   Yes [provider]  DORZOLAMIDE HCL-TIMOLOL MAL OP Place 1 drop into both eyes 2 (two) times daily.   Yes [provider]  hydrochlorothiazide (HYDRODIURIL) 25 MG tablet Take 25 mg by mouth daily.   Yes [provider]  ibuprofen (ADVIL,MOTRIN) 200 MG tablet Take 400 mg by mouth every 6 (six) hours as needed.    Yes [provider]  levothyroxine (SYNTHROID, LEVOTHROID) 75 MCG tablet Take 75 mcg by mouth daily before breakfast.   Yes [provider]  loratadine (CLARITIN) 10 MG tablet Take 10 mg by mouth daily.   Yes [provider]  losartan (COZAAR) 100 MG tablet Take 100 mg by mouth every morning.    Yes [provider]  lovastatin (MEVACOR) 40 MG tablet Take 40 mg by mouth at bedtime.   Yes [provider]  LUMIGAN 0.01 % SOLN Place 1 drop into both eyes at bedtime.  06/06/15  Yes [provider]  metoprolol tartrate (LOPRESSOR) 100 MG tablet Take 100 mg by mouth 2 (two) times daily.   Yes [provider]  pantoprazole (PROTONIX) 40 MG tablet Take 40 mg by mouth daily.   Yes [provider]  potassium chloride SA (K-DUR,KLOR-CON) 20 MEQ tablet Take 20 mEq by mouth 2 (two) times daily.   Yes [provider]  vitamin B-12 (CYANOCOBALAMIN) 1000 MCG tablet Take 1,000 mcg by mouth daily.   Yes [provider]  vitamin C (ASCORBIC ACID) 500 MG tablet Take 500 mg by mouth daily.   Yes [provider]    anastrozole (ARIMIDEX) 1 MG tablet TAKE 1 TABLET DAILY 05/13/17   Karen Kitchens, NP    Family History  Problem Relation Age of Onset  . Breast cancer Mother 43  . Kidney cancer Father   . Kidney Stones Brother   . Prostate cancer Neg Hx      Social History   Tobacco Use  . Smoking status: Never Smoker  . Smokeless tobacco: Never Used  Substance Use Topics  . Alcohol use: No  . Drug use: No    Allergies as of 05/12/2017 - Review Complete 05/12/2017  Allergen Reaction Noted  . Ativan [lorazepam]  12/26/2015  . Cardura [doxazosin] Other (See Comments) 05/09/2015  . Clonidine derivatives Other (See Comments) 05/09/2015  . Enalapril Other (See Comments) 05/09/2015  .  Levaquin [levofloxacin] Other (See Comments) 05/09/2015  . Mobic [meloxicam] Other (See Comments) 05/09/2015  . Sulfa antibiotics Nausea Only 05/09/2015  . Vicodin [hydrocodone-acetaminophen] Other (See Comments) 05/09/2015    Review of Systems:    All systems reviewed and negative except where noted in HPI.   Physical Exam:  Vital signs in last 24 hours: Temp:  [97.8 F (36.6 C)-99.8 F (37.7 C)] 98.5 F (36.9 C) (02/06 1242) Pulse Rate:  [70-94] 70 (02/06 1242) Resp:  [15-20] 18 (02/06 1242) BP: (117-164)/(56-78) 155/62 (02/06 1242) SpO2:  [93 %-98 %] 97 % (02/06 1242) Weight:  [200 lb (90.7 kg)-200 lb 6.4 oz (90.9 kg)] 200 lb 6.4 oz (90.9 kg) (02/05 2157) Last BM Date: 05/13/17 General:   Pleasant, cooperative in NAD Head:  Normocephalic and atraumatic. Eyes:   No icterus.   Conjunctiva pink. PERRLA. Ears:  Normal auditory acuity. Neck:  Supple; no masses or thyroidomegaly Lungs: Respirations even and unlabored. Lungs clear to auscultation bilaterally.   No wheezes, crackles, or rhonchi.  Heart:  Regular rate and rhythm;  Without murmur, clicks, rubs or gallops Abdomen:  Soft, nondistended, nontender. Normal bowel sounds. No appreciable masses or hepatomegaly.  No rebound or guarding.  Rectal:  Not  performed. Msk:  Symmetrical without gross deformities.   Extremities:  Without edema, cyanosis or clubbing. Neurologic:  Alert and oriented x3;  grossly normal neurologically. Skin:  Intact without significant lesions or rashes. Cervical Nodes:  No significant cervical adenopathy. Psych:  Alert and cooperative. Normal affect.  LAB RESULTS: Recent Labs    05/12/17 1554 05/13/17 0519  WBC 8.1 10.2  HGB 14.9 13.7  HCT 43.9 40.8  PLT 319 248   BMET Recent Labs    05/12/17 1554 05/13/17 0519  NA 140 142  K 3.5 3.6  CL 98* 102  CO2 29 28  GLUCOSE 169* 118*  BUN 18 15  CREATININE 1.17* 0.88  CALCIUM 9.4 8.8*   LFT Recent Labs    05/13/17 0519  PROT 7.0  ALBUMIN 3.4*  AST 220*  ALT 264*  ALKPHOS 255*  BILITOT 3.8*   PT/INR No results for input(s): LABPROT, INR in the last 72 hours.  STUDIES: US Abdomen Limited Ruq  Result Date: 05/12/2017 CLINICAL DATA:  77 year old female with right upper quadrant pain and nausea. Initial encounter. EXAM: ULTRASOUND ABDOMEN LIMITED RIGHT UPPER QUADRANT COMPARISON:  12/03/2015 CT. FINDINGS: Gallbladder: Multiple mobile gallstones measuring up to 1.1 cm. No gallbladder wall thickening or pericholecystic fluid. Patient was not tender over this region during scanning per ultrasound technologist. Common bile duct: Diameter: 6 mm Liver: Diffuse fatty infiltration with focal sparing adjacent to the gallbladder fossa. Portal vein is patent on color Doppler imaging with normal direction of blood flow towards the liver. IMPRESSION: Multiple mobile gallstones measuring up to 1.1 cm without ultrasound findings to suggest acute cholecystitis. Fatty liver with focal fatty sparing adjacent to the gallbladder fossa. Electronically Signed   By: Genia Del M.D.   On: 05/12/2017 16:59      Impression / Plan:   Alyssa Duncan is a 77 y.o. y/o female with abdominal pain and nausea that precipitated her admission to the hospital. The patient has had  elevated liver enzymes with an elevated bilirubin of 3.8 this morning. The patient states that her symptoms stopped at approximately 8:00 last night. The patient has been seen by surgery and she has had an MRCP ordered. The patient is supposed to have her MRCP today. If the MRCP shows  a stone in the common bile duct she will need an ERCP. If the MRCP is negative then a laparoscopic cholecystectomy with intra operative cholangiogram would be recommended. The patient, her husband and her daughters have been explained the plan and agree with it.   Thank you for involving me in the care of this patient.      LOS: 0 days   Alyssa Lame, MD  05/13/2017, 3:20 PM   Note: This dictation was prepared with Dragon dictation along with smaller phrase technology. Any transcriptional errors that result from this process are unintentional.

## 2017-05-13 NOTE — H&P (View-Only) (Signed)
SURGICAL PROGRESS NOTE (cpt [phone])  Hospital Day(s): 0.   Post op day(s):  Marland Kitchen   Interval History: Patient seen and examined, no acute events or new complaints since admission overnight. Patient reports RUQ abdominal pain, fevers, and nausea without emesis this past Thursday night, 4 days prior to yesterday's ED presentation. By Friday morning, she felt better without pain or N/V until severe RUQ abdominal pain developed yesterday, prompting her ED presentation. She denies any prior similar symptoms or any N/V, fever/chills, CP, or SOB. However, patient states she is unable to walk from the parking lot to the hospital or up a single flight of steps without SOB without CP. She states she underwent a stress test for her SOB >2 - 3 years ago, but does not recall the results.  Review of Systems:  Constitutional: denies fever, chills  HEENT: denies cough or congestion  Respiratory: denies any shortness of breath  Cardiovascular: denies chest pain or palpitations  Gastrointestinal: abdominal pain, N/V, and bowel function as per interval history Genitourinary: denies burning with urination or urinary frequency Musculoskeletal: denies pain, decreased motor or sensation Integumentary: denies any other rashes or skin discolorations Neurological: denies HA or vision/hearing changes   Vital signs in last 24 hours: [min-max] current  Temp:  [97.8 F (36.6 C)-99.8 F (37.7 C)] 98.4 F (36.9 C) (02/06 0502) Pulse Rate:  [74-94] 77 (02/06 0502) Resp:  [16-20] 20 (02/06 0502) BP: (117-164)/(61-78) 153/61 (02/06 0502) SpO2:  [93 %-98 %] 97 % (02/06 0502) Weight:  [200 lb (90.7 kg)-200 lb 6.4 oz (90.9 kg)] 200 lb 6.4 oz (90.9 kg) (02/05 2157)     Height: 5\' 2"  (157.5 cm) Weight: 200 lb 6.4 oz (90.9 kg) BMI (Calculated): 36.64   Intake/Output this shift:  No intake/output data recorded.   Intake/Output last 2 shifts:  @IOLAST2SHIFTS @   Physical Exam:  Constitutional: alert, cooperative and no  distress  HENT: normocephalic without obvious abnormality  Eyes: PERRL, EOM's grossly intact and symmetric  Neuro: CN II - XII grossly intact and symmetric without deficit  Respiratory: breathing non-labored at rest  Cardiovascular: regular rate and sinus rhythm  Gastrointestinal: soft, non-tender, and non-distended Musculoskeletal: UE and LE FROM, no edema or wounds, motor and sensation grossly intact, NT   Labs:  CBC Latest Ref Rng & Units 05/13/2017 05/12/2017 12/18/2016  WBC 3.6 - 11.0 K/uL 10.2 8.1 8.3  Hemoglobin 12.0 - 16.0 g/dL 13.7 14.9 14.7  Hematocrit 35.0 - 47.0 % 40.8 43.9 42.5  Platelets 150 - 440 K/uL 248 319 309   CMP Latest Ref Rng & Units 05/13/2017 05/12/2017 12/18/2016  Glucose 65 - 99 mg/dL 118(H) 169(H) 122(H)  BUN 6 - 20 mg/dL 15 18 18   Creatinine 0.44 - 1.00 mg/dL 0.88 1.17(H) 1.08(H)  Sodium 135 - 145 mmol/L 142 140 139  Potassium 3.5 - 5.1 mmol/L 3.6 3.5 3.9  Chloride 101 - 111 mmol/L 102 98(L) 99(L)  CO2 22 - 32 mmol/L 28 29 30   Calcium 8.9 - 10.3 mg/dL 8.8(L) 9.4 9.5  Total Protein 6.5 - 8.1 g/dL 7.0 7.5 7.5  Total Bilirubin 0.3 - 1.2 mg/dL 3.8(H) 1.6(H) 0.7  Alkaline Phos 38 - 126 U/L 255(H) 225(H) 93  AST 15 - 41 U/L 220(H) 155(H) 34  ALT 14 - 54 U/L 264(H) 167(H) 32   Lipase (05/13/2017): 46 (from 113)  Imaging studies: No new pertinent imaging studies, though admission ultrasound personally reviewed and discussed with patient   Assessment/Plan: (ICD-10's: K80.71, K85.10) 77 y.o. female with  choledocholithiasis and mild gallstone pancreatitis with further elevated hyperbilirubinemia and transaminasemia this morning despite improved lipase and no significant ductal dilatation on RUQ abdominal ultrasound, complicated by pertinent comorbidities including HTN, HLD, history of atrial fibrillation no longer on therapeutic anticoagulation per cardiology, hypothyroidism, nephrolithiasis, OSA, GERD, and a history of Left breast cancer and skin cancer (Left arm and nose,  not otherwise specified).   - NPO for now with IV fluids and antibiotics  - follow-up/trend repeat LFT's tomorrow morning  - GI consultation for consideration of ERCP vs MRCP  - medical risk stratification and optimization for anticipated cholecystectomy prior to discharge  - medical management of comorbidities as per primary medical team  - DVT prophylaxis, ambulation encouraged  - please call if any questions  All of the above findings and recommendations were discussed with the patient, patient's family, medical physician, and patient's RN, and all of patient's and family's questions were answered to their expressed satisfaction.  Thank you for the opportunity to participate in this patient's care.  -- Marilynne Drivers Rosana Hoes, MD, [place]: [facility] - Partnering for exceptional care. Office: [phone]

## 2017-05-13 NOTE — Progress Notes (Signed)
Patient complained of generalized itching during shift. Skin looked well only redness from where she was scratching. Patient stated that this was new for her. Changed gown to cotton material, notified Dr. Posey Pronto and order for PRN benadryl was given.

## 2017-05-13 NOTE — Progress Notes (Signed)
[facility] [place] NAME: Naziya Hegwood    MR#:  [id number]  DATE OF BIRTH:  Jul 07, 1940  SUBJECTIVE:  Patient came in with right upper quadrant abdominal pain.  She is pain-free right now.  No nausea vomiting  REVIEW OF SYSTEMS:   Review of Systems  Constitutional: Negative for chills, fever and weight loss.  HENT: Negative for ear discharge, ear pain and nosebleeds.   Eyes: Negative for blurred vision, pain and discharge.  Respiratory: Negative for sputum production, shortness of breath, wheezing and stridor.   Cardiovascular: Negative for chest pain, palpitations, orthopnea and PND.  Gastrointestinal: Negative for abdominal pain, diarrhea, nausea and vomiting.  Genitourinary: Negative for frequency and urgency.  Musculoskeletal: Negative for back pain and joint pain.  Neurological: Positive for weakness. Negative for sensory change, speech change and focal weakness.  Psychiatric/Behavioral: Negative for depression and hallucinations. The patient is not nervous/anxious.    Tolerating Diet:npo Tolerating PT: ambulaotry  DRUG ALLERGIES:   Allergies  Allergen Reactions  . Ativan [Lorazepam]     Hysteria   . Cardura [Doxazosin] Other (See Comments)    unsure  . Clonidine Derivatives Other (See Comments)    unsure  . Enalapril Other (See Comments)    unsure  . Levaquin [Levofloxacin] Other (See Comments)    Couldn't raise her arms  . Mobic [Meloxicam] Other (See Comments)    unsure  . Sulfa Antibiotics Nausea Only  . Vicodin [Hydrocodone-Acetaminophen] Other (See Comments)    unsure    VITALS:  Blood pressure (!) 155/56, pulse 73, temperature 98.5 F (36.9 C), temperature source Oral, resp. rate 15, height 5\' 2"  (1.575 m), weight 90.9 kg (200 lb 6.4 oz), SpO2 95 %.  PHYSICAL EXAMINATION:   Physical Exam  GENERAL:  77 y.o.-year-old patient lying in the bed with no acute distress.  EYES: Pupils equal, round, reactive  to light and accommodation. No scleral icterus. Extraocular muscles intact.  HEENT: Head atraumatic, normocephalic. Oropharynx and nasopharynx clear.  NECK:  Supple, no jugular venous distention. No thyroid enlargement, no tenderness.  LUNGS: Normal breath sounds bilaterally, no wheezing, rales, rhonchi. No use of accessory muscles of respiration.  CARDIOVASCULAR: S1, S2 normal. No murmurs, rubs, or gallops.  ABDOMEN: Soft, nontender, nondistended. Bowel sounds present. No organomegaly or mass.  EXTREMITIES: No cyanosis, clubbing or edema b/l.    NEUROLOGIC: Cranial nerves II through XII are intact. No focal Motor or sensory deficits b/l.   PSYCHIATRIC:  patient is alert and oriented x 3.  SKIN: No obvious rash, lesion, or ulcer.   LABORATORY PANEL:  CBC Recent Labs  Lab 05/13/17 0519  WBC 10.2  HGB 13.7  HCT 40.8  PLT 248    Chemistries  Recent Labs  Lab 05/13/17 0519  NA 142  K 3.6  CL 102  CO2 28  GLUCOSE 118*  BUN 15  CREATININE 0.88  CALCIUM 8.8*  AST 220*  ALT 264*  ALKPHOS 255*  BILITOT 3.8*   Cardiac Enzymes No results for input(s): TROPONINI in the last 168 hours. RADIOLOGY:  US Abdomen Limited Ruq  Result Date: 05/12/2017 CLINICAL DATA:  77 year old female with right upper quadrant pain and nausea. Initial encounter. EXAM: ULTRASOUND ABDOMEN LIMITED RIGHT UPPER QUADRANT COMPARISON:  12/03/2015 CT. FINDINGS: Gallbladder: Multiple mobile gallstones measuring up to 1.1 cm. No gallbladder wall thickening or pericholecystic fluid. Patient was not tender over this region during scanning per ultrasound technologist. Common bile duct: Diameter: 6 mm Liver:  Diffuse fatty infiltration with focal sparing adjacent to the gallbladder fossa. Portal vein is patent on color Doppler imaging with normal direction of blood flow towards the liver. IMPRESSION: Multiple mobile gallstones measuring up to 1.1 cm without ultrasound findings to suggest acute cholecystitis. Fatty liver with  focal fatty sparing adjacent to the gallbladder fossa. Electronically Signed   By: Genia Del M.D.   On: 05/12/2017 16:59   ASSESSMENT AND PLAN:  Ashritha Desrosiers  is a 77 y.o. female with a known history of A. fib, hypertension, history of breast cancer presents to the hospital complaining of acute onset of right upper quadrant pain, nausea after eating a meal.  Patient had milder symptoms last week after a meal which resolved on its own with taking Tums  *Cholelithiasis with elevated LFTs.  Likely has symptomatic gallstones.   -With normal CBD unlikely to have a stone there.  - repeat LFTs show increased bilirubin---MRCP today -Dr Marius Ditch to see, pt may need ERCP -  appreciate surgical input  *Atrial fibrillation.  Continue aspirin and rate control medications.  *Hypertension.  On home medications.  Add IV hydralazine.  DVT prophylaxis with Lovenox--change to SCD  D/c pt and family  Case discussed with Care Management/Social Worker. Management plans discussed with the patient, family and they are in agreement.  CODE STATUS: full  DVT Prophylaxis: lovenox d/c--SCD for now since pt may need ERCP/GB sx  TOTAL TIME TAKING CARE OF THIS PATIENT: 25 minutes.  >50% time spent on counselling and coordination of care  POSSIBLE D/C IN 1-2 DAYS, DEPENDING ON CLINICAL CONDITION.  Note: This dictation was prepared with Dragon dictation along with smaller phrase technology. Any transcriptional errors that result from this process are unintentional.  Fritzi Mandes M.D on 05/13/2017 at 11:38 AM  Between 7am to 6pm - Pager - [phone]  After 6pm go to [email or web address] - password [email or web address] [facility]  Office  [phone]  CC: Primary care physician; Adin Hector, MDPatient ID: Waymon Budge, female   DOB: 30-Sep-1940, 77 y.o.   MRN: [id number]

## 2017-05-13 NOTE — Consult Note (Addendum)
Date of Consultation:  05/13/2017  Requesting Physician:  Hillary Bow, MD  Reason for Consultation:  Gallstone pancreatitis  History of Present Illness: Alyssa Duncan is a 77 y.o. female who presents with a one day history of abdominal pain.  Patient reports that her pain started after having peanut butter sandwich for lunch.  Her pain has been in the right upper quadrant.  Associated with nausea but no vomiting.  The pain radiates to her back.  She had a similar episode of pain last week on 1/31 which improved on its own with some Tums and Zantac.  Today the pain was more severe.  She has had kidney stones in the past but this pain did not feel like that.  She had some constipation recently.  Denies any fevers but felt cold today.  Denies any chest pain or shortness of breath.  Denies other areas of abdominal pain.  In the ED she had workup including ultrasound and labs which showed a mildly elevated total bilirubin of 1.6, with AST 155, ALT 167, AP 225, and lipase of 113.  Ultrasound showed gallstones with normal CBD size.  Currently she is asymptomatic and her pain resolved.  Past Medical History: Past Medical History:  Diagnosis Date  . Arthritis   . Asthma   . Back pain   . Breast cancer ([place]) 2013   left breast ductal and lobular carcinoma  . Dysrhythmia 12/2015   A-fib  . Family history of adverse reaction to anesthesia    dad had hard time waking up from anesthesia  . GERD (gastroesophageal reflux disease)   . Hyperlipidemia   . Hypertension   . Hypothyroidism   . Kidney stones 12/2015  . Personal history of radiation therapy 2013   BREAST CA  . Shingles   . Skin cancer 2005   left arm and nose  . Sleep apnea      Past Surgical History: Past Surgical History:  Procedure Laterality Date  . BREAST BIOPSY Left 2013   POS  . BREAST LUMPECTOMY Left 2013   with rad tx  . CARPAL TUNNEL RELEASE    . CATARACT EXTRACTION    . COLONOSCOPY WITH PROPOFOL N/A 05/10/2015    Procedure: COLONOSCOPY WITH PROPOFOL;  Surgeon: Hulen Luster, MD;  Location: [facility] ENDOSCOPY;  Service: Gastroenterology;  Laterality: N/A;  . CYSTOSCOPY W/ RETROGRADES Bilateral 01/02/2016   Procedure: CYSTOSCOPY WITH RETROGRADE PYELOGRAM;  Surgeon: Hollice Espy, MD;  Location: [facility] ORS;  Service: Urology;  Laterality: Bilateral;  . CYSTOSCOPY W/ URETERAL STENT PLACEMENT Right 01/02/2016   Procedure: CYSTOSCOPY WITH STENT REPLACEMENT;  Surgeon: Hollice Espy, MD;  Location: [facility] ORS;  Service: Urology;  Laterality: Right;  . CYSTOSCOPY WITH STENT PLACEMENT Right 12/04/2015   Procedure: CYSTOSCOPY WITH STENT PLACEMENT;  Surgeon: Hollice Espy, MD;  Location: [facility] ORS;  Service: Urology;  Laterality: Right;  . JOINT REPLACEMENT Left 2013   hip  . KNEE ARTHROSCOPY    . SEPTOPLASTY    . TRANSURETHRAL RESECTION OF BLADDER TUMOR WITH MITOMYCIN-C N/A 01/02/2016   Procedure: TRANSURETHRAL RESECTION OF BLADDER TUMOR WITH MITOMYCIN-C;  Surgeon: Hollice Espy, MD;  Location: [facility] ORS;  Service: Urology;  Laterality: N/A;  . URETEROSCOPY WITH HOLMIUM LASER LITHOTRIPSY Right 01/02/2016   Procedure: URETEROSCOPY WITH HOLMIUM LASER LITHOTRIPSY;  Surgeon: Hollice Espy, MD;  Location: [facility] ORS;  Service: Urology;  Laterality: Right;  . UVULOPALATOPHARYNGOPLASTY     and tongue surgery    Home Medications: Prior to Admission medications  Medication Sig Start Date End Date Taking? Authorizing Provider  amLODipine (NORVASC) 5 MG tablet Take 5 mg by mouth every morning.    Yes [provider]  anastrozole (ARIMIDEX) 1 MG tablet Take 1 tablet (1 mg total) by mouth daily. 04/20/17  Yes Karen Kitchens, NP  aspirin EC 81 MG tablet Take 1 tablet (81 mg total) by mouth daily. 02/15/16  Yes Wellington Hampshire, MD  Calcium Carb-Cholecalciferol (CALCIUM 600+D3) 600-800 MG-UNIT TABS Take 1 tablet by mouth 2 (two) times daily.   Yes [provider]  DORZOLAMIDE HCL-TIMOLOL MAL OP Place 1 drop into both eyes  2 (two) times daily.   Yes [provider]  hydrochlorothiazide (HYDRODIURIL) 25 MG tablet Take 25 mg by mouth daily.   Yes [provider]  ibuprofen (ADVIL,MOTRIN) 200 MG tablet Take 400 mg by mouth every 6 (six) hours as needed.    Yes [provider]  levothyroxine (SYNTHROID, LEVOTHROID) 75 MCG tablet Take 75 mcg by mouth daily before breakfast.   Yes [provider]  loratadine (CLARITIN) 10 MG tablet Take 10 mg by mouth daily.   Yes [provider]  losartan (COZAAR) 100 MG tablet Take 100 mg by mouth every morning.    Yes [provider]  lovastatin (MEVACOR) 40 MG tablet Take 40 mg by mouth at bedtime.   Yes [provider]  LUMIGAN 0.01 % SOLN Place 1 drop into both eyes at bedtime.  06/06/15  Yes [provider]  metoprolol tartrate (LOPRESSOR) 100 MG tablet Take 100 mg by mouth 2 (two) times daily.   Yes [provider]  pantoprazole (PROTONIX) 40 MG tablet Take 40 mg by mouth daily.   Yes [provider]  potassium chloride SA (K-DUR,KLOR-CON) 20 MEQ tablet Take 20 mEq by mouth 2 (two) times daily.   Yes [provider]  vitamin B-12 (CYANOCOBALAMIN) 1000 MCG tablet Take 1,000 mcg by mouth daily.   Yes [provider]  vitamin C (ASCORBIC ACID) 500 MG tablet Take 500 mg by mouth daily.   Yes [provider]    Allergies: Allergies  Allergen Reactions  . Ativan [Lorazepam]     Hysteria   . Cardura [Doxazosin] Other (See Comments)    unsure  . Clonidine Derivatives Other (See Comments)    unsure  . Enalapril Other (See Comments)    unsure  . Levaquin [Levofloxacin] Other (See Comments)    Couldn't raise her arms  . Mobic [Meloxicam] Other (See Comments)    unsure  . Sulfa Antibiotics Nausea Only  . Vicodin [Hydrocodone-Acetaminophen] Other (See Comments)    unsure    Social History:  reports that  has never smoked. she has never used smokeless tobacco. She  reports that she does not drink alcohol or use drugs.   Family History: Family History  Problem Relation Age of Onset  . Breast cancer Mother 72  . Kidney cancer Father   . Kidney Stones Brother   . Prostate cancer Neg Hx     Review of Systems: Review of Systems  Constitutional: Positive for chills. Negative for fever.  HENT: Negative for hearing loss.   Respiratory: Negative for shortness of breath.   Cardiovascular: Negative for chest pain.  Gastrointestinal: Positive for abdominal pain, constipation and nausea. Negative for blood in stool, diarrhea and vomiting.  Genitourinary: Negative for dysuria.  Musculoskeletal: Negative for myalgias.  Skin: Negative for rash.  Neurological: Negative for dizziness.  Psychiatric/Behavioral: Negative for depression.  All other systems reviewed and are negative.   Physical Exam BP (!) 164/68 (BP Location: Left Arm)   Pulse 94   Temp 99.8 F (37.7 C) (Oral)   Resp 20   Ht 5\' 2"  (1.575 m)   Wt 90.7 kg (200 lb)   SpO2 96%   BMI 36.58 kg/m  CONSTITUTIONAL: No acute distress HEENT:  Normocephalic, atraumatic, extraocular motion intact. NECK: Trachea is midline, and there is no jugular venous distension.  RESPIRATORY:  Lungs are clear, and breath sounds are equal bilaterally. Normal respiratory effort without pathologic use of accessory muscles. CARDIOVASCULAR: Heart is regular without murmurs, gallops, or rubs. GI: The abdomen is soft, nondistended, obese, non-tender to palpation.  Her prior RUQ pain has resolved and there is no tenderness and negative Murphy's sign.  MUSCULOSKELETAL:  Normal muscle strength and tone in all four extremities.  No peripheral edema or cyanosis. SKIN: Skin turgor is normal. There are no pathologic skin lesions.  NEUROLOGIC:  Motor and sensation is grossly normal.  Cranial nerves are grossly intact. PSYCH:  Alert and oriented to person, place and time. Affect is normal.  Laboratory Analysis: Results for  orders placed or performed during the hospital encounter of 05/12/17 (from the past 24 hour(s))  Lipase, blood     Status: Abnormal   Collection Time: 05/12/17  3:54 PM  Result Value Ref Range   Lipase 113 (H) 11 - 51 U/L  Comprehensive metabolic panel     Status: Abnormal   Collection Time: 05/12/17  3:54 PM  Result Value Ref Range   Sodium 140 135 - 145 mmol/L   Potassium 3.5 3.5 - 5.1 mmol/L   Chloride 98 (L) 101 - 111 mmol/L   CO2 29 22 - 32 mmol/L   Glucose, Bld 169 (H) 65 - 99 mg/dL   BUN 18 6 - 20 mg/dL   Creatinine, Ser 1.17 (H) 0.44 - 1.00 mg/dL   Calcium 9.4 8.9 - 10.3 mg/dL   Total Protein 7.5 6.5 - 8.1 g/dL   Albumin 3.7 3.5 - 5.0 g/dL   AST 155 (H) 15 - 41 U/L   ALT 167 (H) 14 - 54 U/L   Alkaline Phosphatase 225 (H) 38 - 126 U/L   Total Bilirubin 1.6 (H) 0.3 - 1.2 mg/dL   GFR calc non Af Amer 43 (L) >60 mL/min   GFR calc Af Amer 50 (L) >60 mL/min   Anion gap 13 5 - 15  CBC     Status: None   Collection Time: 05/12/17  3:54 PM  Result Value Ref Range   WBC 8.1 3.6 - 11.0 K/uL   RBC 4.76 3.80 - 5.20 MIL/uL   Hemoglobin 14.9 12.0 - 16.0 g/dL   HCT 43.9 35.0 - 47.0 %   MCV 92.3 80.0 - 100.0 fL   MCH 31.3 26.0 - 34.0 pg   MCHC 33.9 32.0 - 36.0 g/dL   RDW 13.7 11.5 - 14.5 %   Platelets 319 150 - 440 K/uL    Imaging: US Abdomen Limited Ruq  Result Date: 05/12/2017 CLINICAL DATA:  77 year old female with right upper quadrant pain and nausea. Initial encounter. EXAM: ULTRASOUND ABDOMEN LIMITED RIGHT UPPER QUADRANT COMPARISON:  12/03/2015 CT. FINDINGS: Gallbladder: Multiple mobile gallstones measuring up to 1.1 cm. No gallbladder wall thickening or pericholecystic fluid. Patient was not tender over this region during scanning per ultrasound technologist. Common bile duct: Diameter: 6 mm Liver: Diffuse fatty infiltration with focal sparing adjacent to the gallbladder fossa. Portal vein  is patent on color Doppler imaging with normal direction of blood flow towards the liver.  IMPRESSION: Multiple mobile gallstones measuring up to 1.1 cm without ultrasound findings to suggest acute cholecystitis. Fatty liver with focal fatty sparing adjacent to the gallbladder fossa. Electronically Signed   By: Genia Del M.D.   On: 05/12/2017 16:59    Assessment and Plan: This is a 77 y.o. female who presents with a one day history and findings showing a mild gallstone pancreatitis.  It seems that she passed a stone which caused mild irritation though now she's asymptomatic.  I have independently viewed the patient's ultrasound and reviewed her laboratory studies.  Overall she has cholelithiasis and no evidence of cholecystitis.  Discussed with the patient that currently she'll be admitted to the medical team and will have repeat labs tomorrow.  It is reassuring that her symptoms have resolved and hopefully her LFTs are trending down tomorrow.  If that is the case, then we can proceed to add her to the OR schedule for laparoscopic cholecystectomy with cholangiogram.  If her LFTs do not improve, then she may need a GI consultation for evaluation for ERCP.  Given that her LFTs and lipase did go up, even if transiently, discussed with the patient that we would still recommend cholecystectomy this admission rather than having her follow up as an outpatient.  She understands that if surgery is done tomorrow, it would be my partner Dr. Rosana Hoes who is the surgeon during daytime tomorrow.  She will be NPO after midnight with IV fluid hydration and appropriate pain and nausea medication as needed.  Patient understands this plan and all of her questions have been answered.  Face-to-face time spent with the patient and care providers was 80 minutes, with more than 50% of the time spent counseling, educating, and coordinating care of the patient.     Melvyn Neth, [place]

## 2017-05-13 NOTE — Consult Note (Signed)
Pharmacy Antibiotic Note  Alyssa Duncan is a 77 y.o. female admitted on 05/12/2017 with intra-abdominal infection.  Pharmacy has been consulted for ceftriaxone dosing.  Plan: Ceftriaxone 2g q 24 hours  Height: 5\' 2"  (157.5 cm) Weight: 200 lb 6.4 oz (90.9 kg) IBW/kg (Calculated) : 50.1  Temp (24hrs), Avg:98.6 F (37 C), Min:97.8 F (36.6 C), Max:99.8 F (37.7 C)  Recent Labs  Lab 05/12/17 1554 05/13/17 0519  WBC 8.1 10.2  CREATININE 1.17* 0.88    Estimated Creatinine Clearance: 55.2 mL/min (by C-G formula based on SCr of 0.88 mg/dL).    Allergies  Allergen Reactions  . Ativan [Lorazepam]     Hysteria   . Cardura [Doxazosin] Other (See Comments)    unsure  . Clonidine Derivatives Other (See Comments)    unsure  . Enalapril Other (See Comments)    unsure  . Levaquin [Levofloxacin] Other (See Comments)    Couldn't raise her arms  . Mobic [Meloxicam] Other (See Comments)    unsure  . Sulfa Antibiotics Nausea Only  . Vicodin [Hydrocodone-Acetaminophen] Other (See Comments)    unsure    Antimicrobials this admission: ceftriaxone 2/6 >>   Dose adjustments this admission:   Microbiology results:   Thank you for allowing pharmacy to be a part of this patient's care.  Ramond Dial, Pharm.D, BCPS Clinical Pharmacist  05/13/2017 11:46 AM

## 2017-05-14 ENCOUNTER — Inpatient Hospital Stay: Payer: Medicare Other | Admitting: Anesthesiology

## 2017-05-14 ENCOUNTER — Encounter: Payer: Self-pay | Admitting: Anesthesiology

## 2017-05-14 ENCOUNTER — Encounter
Admission: EM | Disposition: A | Discharge status: Home / Self Care | Payer: Self-pay | Source: Home / Self Care | Attending: Internal Medicine

## 2017-05-14 ENCOUNTER — Inpatient Hospital Stay
Admit: 2017-05-14 | Discharge: 2017-05-14 | Disposition: A | Discharge status: Home / Self Care | Payer: Medicare Other | Attending: Gastroenterology | Admitting: Gastroenterology

## 2017-05-14 ENCOUNTER — Inpatient Hospital Stay: Payer: Medicare Other

## 2017-05-14 DIAGNOSIS — K8050 Calculus of bile duct without cholangitis or cholecystitis without obstruction: Secondary | ICD-10-CM

## 2017-05-14 HISTORY — PX: ENDOSCOPIC RETROGRADE CHOLANGIOPANCREATOGRAPHY (ERCP) WITH PROPOFOL: SHX5810

## 2017-05-14 LAB — COMPREHENSIVE METABOLIC PANEL
ALBUMIN: 3.4 g/dL — AB (ref 3.5–5.0)
ALT: 195 U/L — ABNORMAL HIGH (ref 14–54)
ANION GAP: 14 (ref 5–15)
AST: 107 U/L — ABNORMAL HIGH (ref 15–41)
Alkaline Phosphatase: 268 U/L — ABNORMAL HIGH (ref 38–126)
BUN: 13 mg/dL (ref 6–20)
CO2: 28 mmol/L (ref 22–32)
Calcium: 8.9 mg/dL (ref 8.9–10.3)
Chloride: 101 mmol/L (ref 101–111)
Creatinine, Ser: 1.05 mg/dL — ABNORMAL HIGH (ref 0.44–1.00)
GFR calc Af Amer: 57 mL/min — ABNORMAL LOW (ref >60)
GFR calc non Af Amer: 50 mL/min — ABNORMAL LOW (ref >60)
Glucose, Bld: 96 mg/dL (ref 65–99)
POTASSIUM: 3.2 mmol/L — AB (ref 3.5–5.1)
SODIUM: 143 mmol/L (ref 135–145)
TOTAL PROTEIN: 7 g/dL (ref 6.5–8.1)
Total Bilirubin: 2.4 mg/dL — ABNORMAL HIGH (ref 0.3–1.2)

## 2017-05-14 LAB — CBC
HCT: 41.1 % (ref 35.0–47.0)
Hemoglobin: 14 g/dL (ref 12.0–16.0)
MCH: 31.5 pg (ref 26.0–34.0)
MCHC: 34.1 g/dL (ref 32.0–36.0)
MCV: 92.3 fL (ref 80.0–100.0)
PLATELETS: 231 10*3/uL (ref 150–440)
RBC: 4.45 MIL/uL (ref 3.80–5.20)
RDW: 13.9 % (ref 11.5–14.5)
WBC: 5.7 10*3/uL (ref 3.6–11.0)

## 2017-05-14 LAB — ECHOCARDIOGRAM COMPLETE
HEIGHTINCHES: 62 in
WEIGHTICAEL: 3204.8 [oz_av]

## 2017-05-14 SURGERY — ENDOSCOPIC RETROGRADE CHOLANGIOPANCREATOGRAPHY (ERCP) WITH PROPOFOL
Anesthesia: General

## 2017-05-14 SURGERY — ERCP, WITH INTERVENTION IF INDICATED
Anesthesia: General

## 2017-05-14 MED ORDER — PROPOFOL 10 MG/ML IV BOLUS
INTRAVENOUS | Status: DC | PRN
  Administered 2017-05-14: 100 mg via INTRAVENOUS
  Administered 2017-05-14: 80 mg via INTRAVENOUS

## 2017-05-14 MED ORDER — LIDOCAINE HCL (PF) 2 % IJ SOLN
INTRAMUSCULAR | Status: AC
  Filled 2017-05-14: qty 10

## 2017-05-14 MED ORDER — IPRATROPIUM-ALBUTEROL 0.5-2.5 (3) MG/3ML IN SOLN
3.0000 mL | Freq: Once | RESPIRATORY_TRACT | Status: AC
  Administered 2017-05-14: 3 mL via RESPIRATORY_TRACT

## 2017-05-14 MED ORDER — IPRATROPIUM-ALBUTEROL 0.5-2.5 (3) MG/3ML IN SOLN
RESPIRATORY_TRACT | Status: AC
  Filled 2017-05-14: qty 3

## 2017-05-14 MED ORDER — SUCCINYLCHOLINE CHLORIDE 20 MG/ML IJ SOLN
INTRAMUSCULAR | Status: DC | PRN
  Administered 2017-05-14: 200 mg via INTRAVENOUS

## 2017-05-14 MED ORDER — FENTANYL CITRATE (PF) 100 MCG/2ML IJ SOLN: 25.0000 ug | INTRAMUSCULAR | Status: DC | PRN

## 2017-05-14 MED ORDER — PROPOFOL 500 MG/50ML IV EMUL
INTRAVENOUS | Status: DC | PRN
  Administered 2017-05-14: 150 ug/kg/min via INTRAVENOUS

## 2017-05-14 MED ORDER — PROPOFOL 500 MG/50ML IV EMUL
INTRAVENOUS | Status: AC
  Filled 2017-05-14: qty 100

## 2017-05-14 MED ORDER — PHENYLEPHRINE HCL 10 MG/ML IJ SOLN
INTRAMUSCULAR | Status: DC | PRN
  Administered 2017-05-14: 100 ug via INTRAVENOUS

## 2017-05-14 MED ORDER — SUCCINYLCHOLINE CHLORIDE 20 MG/ML IJ SOLN
INTRAMUSCULAR | Status: AC
  Filled 2017-05-14: qty 1

## 2017-05-14 MED ORDER — LIDOCAINE HCL (CARDIAC) 20 MG/ML IV SOLN
INTRAVENOUS | Status: DC | PRN
  Administered 2017-05-14: 100 mg via INTRAVENOUS

## 2017-05-14 MED ORDER — DEXAMETHASONE SODIUM PHOSPHATE 10 MG/ML IJ SOLN
INTRAMUSCULAR | Status: DC | PRN
  Administered 2017-05-14: 10 mg via INTRAVENOUS

## 2017-05-14 MED ORDER — SODIUM CHLORIDE 0.9 % IV SOLN
INTRAVENOUS | Status: DC | PRN
  Administered 2017-05-14: 07:00:00 via INTRAVENOUS

## 2017-05-14 NOTE — Anesthesia Post-op Follow-up Note (Signed)
Anesthesia QCDR form completed.        

## 2017-05-14 NOTE — Progress Notes (Signed)
*  PRELIMINARY RESULTS* Echocardiogram 2D Echocardiogram has been performed.  Alyssa Duncan 05/14/2017, 2:45 PM

## 2017-05-14 NOTE — Op Note (Signed)
[facility] Gastroenterology Patient Name: Alyssa Duncan Procedure Date: 05/14/2017 7:27 AM MRN: [id number] Account #: [id number] Date of Birth: [birth year] Admit Type: Outpatient Age: 77 Room: [facility] ENDO ROOM 4 Gender: Female Note Status: Finalized Procedure:            ERCP Indications:          Jaundice Providers:            Lucilla Lame MD, MD Referring MD:         Ramonita Lab, MD (Referring MD) Medicines:            General Anesthesia Complications:        No immediate complications. Procedure:            Pre-Anesthesia Assessment:                       - Prior to the procedure, a History and Physical was                        performed, and patient medications and allergies were                        reviewed. The patient's tolerance of previous                        anesthesia was also reviewed. The risks and benefits of                        the procedure and the sedation options and risks were                        discussed with the patient. All questions were                        answered, and informed consent was obtained. Prior                        Anticoagulants: The patient has taken no previous                        anticoagulant or antiplatelet agents. ASA Grade                        Assessment: II - A patient with mild systemic disease.                        After reviewing the risks and benefits, the patient was                        deemed in satisfactory condition to undergo the                        procedure.                       After obtaining informed consent, the scope was passed                        under direct vision. Throughout the procedure, the  patient's blood pressure, pulse, and oxygen saturations                        were monitored continuously. The Endoscope was                        introduced through the mouth, and used to inject                        contrast into and used to inject  contrast into the bile                        duct. The ERCP was accomplished without difficulty. The                        patient tolerated the procedure well. Findings:      The scout film was normal. The esophagus was successfully intubated       under direct vision. The scope was advanced to a normal major papilla in       the descending duodenum without detailed examination of the pharynx,       larynx and associated structures, and upper GI tract. The upper GI tract       was grossly normal. The bile duct was deeply cannulated with the       short-nosed traction sphincterotome. Contrast was injected. I personally       interpreted the bile duct images. There was brisk flow of contrast       through the ducts. Image quality was excellent. Contrast extended to the       entire biliary tree. The middle third of the main bile duct contained       one stone, which was 10 mm in diameter. A wire was passed into the       biliary tree. A 7 mm biliary sphincterotomy was made with a traction       (standard) sphincterotome using ERBE electrocautery. There was no       post-sphincterotomy bleeding. The biliary tree was swept with a 15 mm       balloon starting at the bifurcation. One stone was removed. No stones       remained. Impression:           - Choledocholithiasis was found. Complete removal was                        accomplished by biliary sphincterotomy and balloon                        extraction.                       - A biliary sphincterotomy was performed.                       - The biliary tree was swept. Recommendation:       - Return patient to hospital ward for ongoing care.                       - Watch for pancreatitis, bleeding, perforation, and                        cholangitis.  Procedure Code(s):    --- Professional ---                       [phone], Endoscopic retrograde cholangiopancreatography                        (ERCP); with removal of calculi/debris from                         biliary/pancreatic duct(s)                       43262, Endoscopic retrograde cholangiopancreatography                        (ERCP); with sphincterotomy/papillotomy                       [phone], Endoscopic catheterization of the biliary ductal                        system, radiological supervision and interpretation Diagnosis Code(s):    --- Professional ---                       R17, Unspecified jaundice                       K80.50, Calculus of bile duct without cholangitis or                        cholecystitis without obstruction CPT copyright 2016 American [employer]. All rights reserved. The codes documented in this report are preliminary and upon coder review may  be revised to meet current compliance requirements. Lucilla Lame MD, MD 05/14/2017 8:27:35 AM This report has been signed electronically. Number of Addenda: 0 Note Initiated On: 05/14/2017 7:27 AM      [facility]

## 2017-05-14 NOTE — Progress Notes (Signed)
[facility] [place] NAME: Aslyn Cottman    MR#:  [id number]  DATE OF BIRTH:  08-06-1940  SUBJECTIVE:  Patient came in with right upper quadrant abdominal pain.  She is pain-free right now.  No nausea vomiting  REVIEW OF SYSTEMS:   Review of Systems  Constitutional: Negative for chills, fever and weight loss.  HENT: Negative for ear discharge, ear pain and nosebleeds.   Eyes: Negative for blurred vision, pain and discharge.  Respiratory: Negative for sputum production, shortness of breath, wheezing and stridor.   Cardiovascular: Negative for chest pain, palpitations, orthopnea and PND.  Gastrointestinal: Negative for abdominal pain, diarrhea, nausea and vomiting.  Genitourinary: Negative for frequency and urgency.  Musculoskeletal: Negative for back pain and joint pain.  Neurological: Positive for weakness. Negative for sensory change, speech change and focal weakness.  Psychiatric/Behavioral: Negative for depression and hallucinations. The patient is not nervous/anxious.    Tolerating Diet:npo Tolerating PT: ambulaotry  DRUG ALLERGIES:   Allergies  Allergen Reactions  . Ativan [Lorazepam]     Hysteria   . Cardura [Doxazosin] Other (See Comments)    unsure  . Clonidine Derivatives Other (See Comments)    unsure  . Enalapril Other (See Comments)    unsure  . Levaquin [Levofloxacin] Other (See Comments)    Couldn't raise her arms  . Mobic [Meloxicam] Other (See Comments)    unsure  . Sulfa Antibiotics Nausea Only  . Vicodin [Hydrocodone-Acetaminophen] Other (See Comments)    unsure    VITALS:  Blood pressure (!) 160/64, pulse 75, temperature (!) 97.1 F (36.2 C), temperature source Axillary, resp. rate 18, height 5\' 2"  (1.575 m), weight 90.9 kg (200 lb 4.8 oz), SpO2 94 %.  PHYSICAL EXAMINATION:   Physical Exam  GENERAL:  77 y.o.-year-old patient lying in the bed with no acute distress.  EYES: Pupils equal, round,  reactive to light and accommodation. No scleral icterus. Extraocular muscles intact.  HEENT: Head atraumatic, normocephalic. Oropharynx and nasopharynx clear.  NECK:  Supple, no jugular venous distention. No thyroid enlargement, no tenderness.  LUNGS: Normal breath sounds bilaterally, no wheezing, rales, rhonchi. No use of accessory muscles of respiration.  CARDIOVASCULAR: S1, S2 normal. No murmurs, rubs, or gallops.  ABDOMEN: Soft, nontender, nondistended. Bowel sounds present. No organomegaly or mass.  EXTREMITIES: No cyanosis, clubbing or edema b/l.    NEUROLOGIC: Cranial nerves II through XII are intact. No focal Motor or sensory deficits b/l.   PSYCHIATRIC:  patient is alert and oriented x 3.  SKIN: No obvious rash, lesion, or ulcer.   LABORATORY PANEL:  CBC Recent Labs  Lab 05/14/17 0400  WBC 5.7  HGB 14.0  HCT 41.1  PLT 231    Chemistries  Recent Labs  Lab 05/14/17 0400  NA 143  K 3.2*  CL 101  CO2 28  GLUCOSE 96  BUN 13  CREATININE 1.05*  CALCIUM 8.9  AST 107*  ALT 195*  ALKPHOS 268*  BILITOT 2.4*   Cardiac Enzymes No results for input(s): TROPONINI in the last 168 hours. RADIOLOGY:  Dg C-arm 1-60 Min-no Report  Result Date: 05/14/2017 Fluoroscopy was utilized by the requesting physician.  No radiographic interpretation.   Mr Abdomen Mrcp W Wo Contast  Result Date: 05/13/2017 CLINICAL DATA:  Acute onset right upper quadrant pain with nausea EXAM: MRI ABDOMEN WITHOUT AND WITH CONTRAST (INCLUDING MRCP) TECHNIQUE: Multiplanar multisequence MR imaging of the abdomen was performed both before and after the administration  of intravenous contrast. Heavily T2-weighted images of the biliary and pancreatic ducts were obtained, and three-dimensional MRCP images were rendered by post processing. CONTRAST:  81mL MULTIHANCE GADOBENATE DIMEGLUMINE 529 MG/ML IV SOLN COMPARISON:  Ultrasound 05/12/2017, CT 12/03/2015 FINDINGS: Lower chest: No acute findings. Hepatobiliary: Hepatic  steatosis. No focal hepatic abnormality. Enlarged gallbladder measuring up to 4.2 cm. Multiple stones in the gallbladder. No surrounding fluid or inflammatory changes. No intra hepatic biliary ductal dilatation. Borderline to mild enlargement of extrahepatic common bile duct, measuring up to 8 mm. 6 mm hypointense filling defect within the mid to distal common duct consistent with ductal stone, series 9, image number 45. Pancreas: No mass, inflammatory changes, or other parenchymal abnormality identified. Spleen:  Within normal limits in size and appearance. Adrenals/Urinary Tract: Adrenal glands are within normal limits. Tiny cysts within the right kidney 9 mm cyst lower pole left kidney, best seen on coronal views. No hydronephrosis. Nonspecific mild perinephric edema. Probable mild scarring at the lower pole of the left kidney. Stomach/Bowel: Visualized portions within the abdomen are unremarkable. Vascular/Lymphatic: No pathologically enlarged lymph nodes identified. No abdominal aortic aneurysm demonstrated. Other:  None. Musculoskeletal: No suspicious bone lesions identified. IMPRESSION: 1. Dilated gallbladder with multiple gallstones but negative for pericholecystic fluid or gallbladder wall thickening 2. Borderline to slightly enlarged common bile duct with 6 mm stone visualized in the mid to distal common bile duct. 3. Hepatic steatosis Electronically Signed   By: Donavan Foil M.D.   On: 05/13/2017 21:42   ASSESSMENT AND PLAN:  Alyssa Duncan  is a 77 y.o. female with a known history of A. fib, hypertension, history of breast cancer presents to the hospital complaining of acute onset of right upper quadrant pain, nausea after eating a meal.  Patient had milder symptoms last week after a meal which resolved on its own with taking Tums  *Cholelithiasis with elevated LFTs.  Likely has symptomatic gallstones.   -With normal CBD unlikely to have a stone there.  - repeat LFTs show increased bilirubin---MRCP  for dilatation of the CBD.  Patient is status post ERCP with one large stone removal by Dr. Allen Norris -  appreciate surgical input--- patient will need lap chole.  *Atrial fibrillation.  Continue aspirin and rate control medications. -No history of coronary artery disease.  Echo shows EF of 55-60%.  She has a had a stress echo in 2016 which was unremarkable.  Patient denies any chest pain.  She understands the risks and benefits for surgery.  She is at intermediate risk for surgery.  This was discussed with patient and family.  *Hypertension.  On home medications.  Add IV hydralazine.  DVT prophylaxis with Lovenox--change to SCD  D/c pt and family  Case discussed with Care Management/Social Worker. Management plans discussed with the patient, family and they are in agreement.  CODE STATUS: full  DVT Prophylaxis: lovenox d/c--SCD for now since pt may need ERCP/GB sx  TOTAL TIME TAKING CARE OF THIS PATIENT: 25 minutes.  >50% time spent on counselling and coordination of care  POSSIBLE D/C IN 1-2 DAYS, DEPENDING ON CLINICAL CONDITION.  Note: This dictation was prepared with Dragon dictation along with smaller phrase technology. Any transcriptional errors that result from this process are unintentional.  Fritzi Mandes M.D on 05/14/2017 at 5:18 PM  Between 7am to 6pm - Pager - [phone]  After 6pm go to [email or web address] - password [email or web address] [facility]  Office  [phone]  CC: Primary care physician; Tama High III,  MDPatient ID: Waymon Budge, female   DOB: 06-14-1940, 77 y.o.   MRN: [id number]

## 2017-05-14 NOTE — Transfer of Care (Signed)
Immediate Anesthesia Transfer of Care Note  Patient: Alyssa Duncan  Procedure(s) Performed: ENDOSCOPIC RETROGRADE CHOLANGIOPANCREATOGRAPHY (ERCP) WITH PROPOFOL (N/A )  Patient Location: PACU  Anesthesia Type:General  Level of Consciousness: sedated  Airway & Oxygen Therapy: Patient Spontanous Breathing and Patient connected to face mask oxygen  Post-op Assessment: Report given to RN and Post -op Vital signs reviewed and stable  Post vital signs: Reviewed and stable  Last Vitals:  Vitals:   05/13/17 1944 05/14/17 0423  BP: (!) 150/63 (!) 156/57  Pulse: 70 65  Resp: 20 20  Temp: (!) 36.3 C 36.7 C  SpO2: 93% 96%    Last Pain:  Vitals:   05/14/17 0423  TempSrc: Oral  PainSc:       Patients Stated Pain Goal: 0 (05/69/79 4801)  Complications: No apparent anesthesia complications

## 2017-05-14 NOTE — Anesthesia Postprocedure Evaluation (Signed)
Anesthesia Post Note  Patient: [name]  Procedure(s) Performed: ENDOSCOPIC RETROGRADE CHOLANGIOPANCREATOGRAPHY (ERCP) WITH PROPOFOL (N/A )  Patient location during evaluation: PACU Anesthesia Type: General Level of consciousness: awake and alert Pain management: pain level controlled Vital Signs Assessment: post-procedure vital signs reviewed and stable Respiratory status: spontaneous breathing, nonlabored ventilation, respiratory function stable and patient connected to nasal cannula oxygen Cardiovascular status: blood pressure returned to baseline and stable Postop Assessment: no apparent nausea or vomiting Anesthetic complications: no     Last Vitals:  Vitals:   05/14/17 0911 05/14/17 0935  BP: (!) 144/62 (!) 160/57  Pulse: 73 70  Resp: 16 16  Temp: 37.1 C 36.9 C  SpO2: 95% 97%    Last Pain:  Vitals:   05/14/17 0937  TempSrc:   PainSc: 0-No pain                 Precious Haws Iven Earnhart

## 2017-05-14 NOTE — Anesthesia Procedure Notes (Signed)
Procedure Name: Intubation Date/Time: 05/14/2017 7:53 AM Performed by: Andria Frames, MD Pre-anesthesia Checklist: Patient identified, Patient being monitored, Timeout performed, Emergency Drugs available and Suction available Patient Re-evaluated:Patient Re-evaluated prior to induction Oxygen Delivery Method: Circle system utilized Preoxygenation: Pre-oxygenation with 100% oxygen Induction Type: IV induction Ventilation: Mask ventilation without difficulty Laryngoscope Size: 3 and McGraph Grade View: Grade I Tube type: Oral Tube size: 7.0 mm Number of attempts: 1 Airway Equipment and Method: Stylet Placement Confirmation: ETT inserted through vocal cords under direct vision,  positive ETCO2 and breath sounds checked- equal and bilateral Secured at: 21 cm Tube secured with: Tape Dental Injury: Teeth and Oropharynx as per pre-operative assessment  Difficulty Due To: Difficult Airway- due to large tongue, Difficult Airway- due to reduced neck mobility and Difficult Airway- due to anterior larynx Future Recommendations: Recommend- induction with short-acting agent, and alternative techniques readily available

## 2017-05-14 NOTE — Anesthesia Preprocedure Evaluation (Signed)
Anesthesia Evaluation  Patient identified by MRN, date of birth, ID band Patient awake    Reviewed: Allergy & Precautions, H&P , NPO status , Patient's Chart, lab work & pertinent test results  History of Anesthesia Complications (+) history of anesthetic complications  Airway Mallampati: III  TM Distance: <3 FB Neck ROM: full    Dental  (+) Chipped, Poor Dentition   Pulmonary neg shortness of breath, asthma , sleep apnea and Continuous Positive Airway Pressure Ventilation , COPD,           Cardiovascular Exercise Tolerance: Good hypertension, (-) angina(-) Past MI and (-) DOE + dysrhythmias      Neuro/Psych PSYCHIATRIC DISORDERS negative neurological ROS     GI/Hepatic negative GI ROS, Neg liver ROS, GERD  Medicated and Controlled,  Endo/Other  Hypothyroidism   Renal/GU Renal disease  negative genitourinary   Musculoskeletal  (+) Arthritis ,   Abdominal   Peds  Hematology negative hematology ROS (+)   Anesthesia Other Findings Patient is NPO appropriate and reports no nausea or vomiting today.  Past Medical History: No date: Arthritis No date: Asthma No date: Back pain 2013: Breast cancer ([place])     Comment:  left breast ductal and lobular carcinoma 12/2015: Dysrhythmia     Comment:  A-fib No date: Family history of adverse reaction to anesthesia     Comment:  dad had hard time waking up from anesthesia No date: GERD (gastroesophageal reflux disease) No date: Hyperlipidemia No date: Hypertension No date: Hypothyroidism 12/2015: Kidney stones 2013: Personal history of radiation therapy     Comment:  BREAST CA No date: Shingles 2005: Skin cancer     Comment:  left arm and nose No date: Sleep apnea  Past Surgical History: 2013: BREAST BIOPSY; Left     Comment:  POS 2013: BREAST LUMPECTOMY; Left     Comment:  with rad tx No date: CARPAL TUNNEL RELEASE No date: CATARACT EXTRACTION 05/10/2015:  COLONOSCOPY WITH PROPOFOL; N/A     Comment:  Procedure: COLONOSCOPY WITH PROPOFOL;  Surgeon: Hulen Luster, MD;  Location: [facility] END[facility];  Service:               Gastroenterology;  Laterality: N/A; 01/02/2016: CYSTOSCOPY W/ RETROGRADES; Bilateral     Comment:  Procedure: CYSTOSCOPY WITH RETROGRADE PYELOGRAM;                Surgeon: Hollice Espy, MD;  Location: [facility] ORS;                Service: Urology;  Laterality: Bilateral; 01/02/2016: CYSTOSCOPY W/ URETERAL STENT PLACEMENT; Right     Comment:  Procedure: CYSTOSCOPY WITH STENT REPLACEMENT;  Surgeon:               Hollice Espy, MD;  Location: [facility] ORS;  Service:               Urology;  Laterality: Right; 12/04/2015: CYSTOSCOPY WITH STENT PLACEMENT; Right     Comment:  Procedure: CYSTOSCOPY WITH STENT PLACEMENT;  Surgeon:               Hollice Espy, MD;  Location: [facility] OR[facility];  Service:               Urology;  Laterality: Right; 2013: JOINT REPLACEMENT; Left     Comment:  hip No date: KNEE ARTHROSCOPY No date: SEPTOPLASTY 01/02/2016: TRANSURETHRAL RESECTION OF BLADDER TUMOR WITH MITOMYCIN-C;  N/A     Comment:  Procedure: TRANSURETHRAL RESECTION OF BLADDER TUMOR WITH              MITOMYCIN-C;  Surgeon: Hollice Espy, MD;  Location:               [facility] OR[facility];  Service: Urology;  Laterality: N/A; 01/02/2016: URETEROSCOPY WITH HOLMIUM LASER LITHOTRIPSY; Right     Comment:  Procedure: URETEROSCOPY WITH HOLMIUM LASER LITHOTRIPSY;               Surgeon: Hollice Espy, MD;  Location: [facility] ORS;                Service: Urology;  Laterality: Right; No date: UVULOPALATOPHARYNGOPLASTY     Comment:  and tongue surgery  BMI    Body Mass Index:  36.64 kg/m      Reproductive/Obstetrics negative OB ROS                             Anesthesia Physical Anesthesia Plan  ASA: III  Anesthesia Plan: General   Post-op Pain Management:    Induction: Intravenous  PONV Risk Score and Plan: TIVA and Propofol  infusion  Airway Management Planned: Natural Airway and Nasal Cannula  Additional Equipment:   Intra-op Plan:   Post-operative Plan:   Informed Consent: I have reviewed the patients History and Physical, chart, labs and discussed the procedure including the risks, benefits and alternatives for the proposed anesthesia with the patient or authorized representative who has indicated his/her understanding and acceptance.   Dental Advisory Given  Plan Discussed with: Anesthesiologist, CRNA and Surgeon  Anesthesia Plan Comments: (Patient consented for risks of anesthesia including but not limited to:  - adverse reactions to medications - risk of intubation if required - damage to teeth, lips or other oral mucosa - sore throat or hoarseness - Damage to heart, brain, lungs or loss of life  Patient voiced understanding.)        Anesthesia Quick Evaluation

## 2017-05-15 ENCOUNTER — Ambulatory Visit: Admit: 2017-05-15 | Payer: Medicare Other | Admitting: Surgery

## 2017-05-15 ENCOUNTER — Inpatient Hospital Stay: Payer: Medicare Other

## 2017-05-15 ENCOUNTER — Encounter
Admission: EM | Disposition: A | Discharge status: Home / Self Care | Payer: Self-pay | Source: Home / Self Care | Attending: Internal Medicine

## 2017-05-15 ENCOUNTER — Inpatient Hospital Stay: Payer: Medicare Other | Admitting: Anesthesiology

## 2017-05-15 HISTORY — PX: CHOLECYSTECTOMY: SHX55

## 2017-05-15 LAB — MRSA PCR SCREENING: MRSA BY PCR: NEGATIVE

## 2017-05-15 LAB — COMPREHENSIVE METABOLIC PANEL
ALBUMIN: 3.3 g/dL — AB (ref 3.5–5.0)
ALK PHOS: 272 U/L — AB (ref 38–126)
ALT: 186 U/L — AB (ref 14–54)
AST: 112 U/L — AB (ref 15–41)
Anion gap: 11 (ref 5–15)
BUN: 11 mg/dL (ref 6–20)
CALCIUM: 8.7 mg/dL — AB (ref 8.9–10.3)
CO2: 28 mmol/L (ref 22–32)
CREATININE: 0.8 mg/dL (ref 0.44–1.00)
Chloride: 101 mmol/L (ref 101–111)
GFR calc Af Amer: >60 mL/min (ref >60)
GLUCOSE: 153 mg/dL — AB (ref 65–99)
POTASSIUM: 3.6 mmol/L (ref 3.5–5.1)
Sodium: 140 mmol/L (ref 135–145)
TOTAL PROTEIN: 7.1 g/dL (ref 6.5–8.1)
Total Bilirubin: 1.4 mg/dL — ABNORMAL HIGH (ref 0.3–1.2)

## 2017-05-15 LAB — CBC
HCT: 41.7 % (ref 35.0–47.0)
HEMOGLOBIN: 13.6 g/dL (ref 12.0–16.0)
MCH: 30.3 pg (ref 26.0–34.0)
MCHC: 32.7 g/dL (ref 32.0–36.0)
MCV: 92.5 fL (ref 80.0–100.0)
Platelets: 311 10*3/uL (ref 150–440)
RBC: 4.51 MIL/uL (ref 3.80–5.20)
RDW: 13.7 % (ref 11.5–14.5)
WBC: 10.6 10*3/uL (ref 3.6–11.0)

## 2017-05-15 LAB — LIPASE, BLOOD: Lipase: 36 U/L (ref 11–51)

## 2017-05-15 SURGERY — LAPAROSCOPIC CHOLECYSTECTOMY WITH INTRAOPERATIVE CHOLANGIOGRAM
Anesthesia: General | Wound class: Clean Contaminated

## 2017-05-15 MED ORDER — MIDAZOLAM HCL 2 MG/2ML IJ SOLN
INTRAMUSCULAR | Status: AC
  Filled 2017-05-15: qty 2

## 2017-05-15 MED ORDER — ROCURONIUM BROMIDE 100 MG/10ML IV SOLN
INTRAVENOUS | Status: DC | PRN
  Administered 2017-05-15: 10 mg via INTRAVENOUS

## 2017-05-15 MED ORDER — LACTATED RINGERS IV SOLN
INTRAVENOUS | Status: DC | PRN
  Administered 2017-05-15: 14:00:00 via INTRAVENOUS

## 2017-05-15 MED ORDER — SUGAMMADEX SODIUM 200 MG/2ML IV SOLN
INTRAVENOUS | Status: DC | PRN
  Administered 2017-05-15: 176.4 mg via INTRAVENOUS

## 2017-05-15 MED ORDER — SUCCINYLCHOLINE CHLORIDE 20 MG/ML IJ SOLN
INTRAMUSCULAR | Status: AC
  Filled 2017-05-15: qty 1

## 2017-05-15 MED ORDER — ROCURONIUM BROMIDE 50 MG/5ML IV SOLN
INTRAVENOUS | Status: AC
  Filled 2017-05-15: qty 1

## 2017-05-15 MED ORDER — DEXTROSE 5 % IV SOLN
2.0000 g | INTRAVENOUS | Status: DC
  Administered 2017-05-15: 2 g via INTRAVENOUS
  Filled 2017-05-15 (×2): qty 20

## 2017-05-15 MED ORDER — SUGAMMADEX SODIUM 200 MG/2ML IV SOLN
INTRAVENOUS | Status: AC
  Filled 2017-05-15: qty 2

## 2017-05-15 MED ORDER — FENTANYL CITRATE (PF) 100 MCG/2ML IJ SOLN
INTRAMUSCULAR | Status: DC | PRN
  Administered 2017-05-15: 100 ug via INTRAVENOUS
  Administered 2017-05-15: 50 ug via INTRAVENOUS

## 2017-05-15 MED ORDER — LIDOCAINE HCL (PF) 2 % IJ SOLN
INTRAMUSCULAR | Status: AC
  Filled 2017-05-15: qty 10

## 2017-05-15 MED ORDER — DEXAMETHASONE SODIUM PHOSPHATE 10 MG/ML IJ SOLN
INTRAMUSCULAR | Status: DC | PRN
  Administered 2017-05-15: 10 mg via INTRAVENOUS

## 2017-05-15 MED ORDER — KETOROLAC TROMETHAMINE 30 MG/ML IJ SOLN
INTRAMUSCULAR | Status: DC | PRN
  Administered 2017-05-15: 30 mg via INTRAVENOUS

## 2017-05-15 MED ORDER — PROPOFOL 10 MG/ML IV BOLUS
INTRAVENOUS | Status: AC
  Filled 2017-05-15: qty 20

## 2017-05-15 MED ORDER — TRAMADOL HCL 50 MG PO TABS
50.0000 mg | ORAL_TABLET | Freq: Four times a day (QID) | ORAL | Status: DC | PRN
  Administered 2017-05-15: 50 mg via ORAL
  Filled 2017-05-15 (×2): qty 1

## 2017-05-15 MED ORDER — KCL IN DEXTROSE-NACL 20-5-0.45 MEQ/L-%-% IV SOLN
INTRAVENOUS | Status: DC
  Administered 2017-05-15 – 2017-05-16 (×2): via INTRAVENOUS
  Filled 2017-05-15 (×3): qty 1000

## 2017-05-15 MED ORDER — SODIUM CHLORIDE 0.9 % IV SOLN
INTRAVENOUS | Status: DC | PRN
  Administered 2017-05-15: 12 mL

## 2017-05-15 MED ORDER — FENTANYL CITRATE (PF) 100 MCG/2ML IJ SOLN
INTRAMUSCULAR | Status: AC
  Filled 2017-05-15: qty 4

## 2017-05-15 MED ORDER — LIDOCAINE HCL (CARDIAC) 20 MG/ML IV SOLN
INTRAVENOUS | Status: DC | PRN
  Administered 2017-05-15: 100 mg via INTRAVENOUS

## 2017-05-15 MED ORDER — DEXAMETHASONE SODIUM PHOSPHATE 10 MG/ML IJ SOLN
INTRAMUSCULAR | Status: AC
  Filled 2017-05-15: qty 1

## 2017-05-15 MED ORDER — FENTANYL CITRATE (PF) 100 MCG/2ML IJ SOLN: 25.0000 ug | INTRAMUSCULAR | Status: DC | PRN

## 2017-05-15 MED ORDER — SUCCINYLCHOLINE CHLORIDE 20 MG/ML IJ SOLN
INTRAMUSCULAR | Status: DC | PRN
  Administered 2017-05-15: 100 mg via INTRAVENOUS

## 2017-05-15 MED ORDER — BUPIVACAINE HCL (PF) 0.5 % IJ SOLN
INTRAMUSCULAR | Status: AC
  Filled 2017-05-15: qty 30

## 2017-05-15 MED ORDER — PROPOFOL 10 MG/ML IV BOLUS
INTRAVENOUS | Status: DC | PRN
  Administered 2017-05-15: 150 mg via INTRAVENOUS

## 2017-05-15 MED ORDER — PHENYLEPHRINE HCL 10 MG/ML IJ SOLN
INTRAMUSCULAR | Status: DC | PRN
  Administered 2017-05-15 (×3): 100 ug via INTRAVENOUS

## 2017-05-15 MED ORDER — CEFAZOLIN SODIUM-DEXTROSE 2-3 GM-%(50ML) IV SOLR
INTRAVENOUS | Status: DC | PRN
  Administered 2017-05-15: 2 g via INTRAVENOUS

## 2017-05-15 MED ORDER — ONDANSETRON HCL 4 MG/2ML IJ SOLN
INTRAMUSCULAR | Status: AC
  Filled 2017-05-15: qty 2

## 2017-05-15 MED ORDER — ENOXAPARIN SODIUM 40 MG/0.4ML ~~LOC~~ SOLN
40.0000 mg | SUBCUTANEOUS | Status: DC
  Administered 2017-05-16: 40 mg via SUBCUTANEOUS
  Filled 2017-05-15: qty 0.4

## 2017-05-15 MED ORDER — LIDOCAINE HCL (PF) 1 % IJ SOLN
INTRAMUSCULAR | Status: AC
  Filled 2017-05-15: qty 30

## 2017-05-15 SURGICAL SUPPLY — 39 items
APPLIER CLIP ROT 10 11.4 M/L (STAPLE) ×3
CATH REDDICK CHOLANGI 4FR 50CM (CATHETERS) ×3 IMPLANT
CHLORAPREP W/TINT 26ML (MISCELLANEOUS) ×3 IMPLANT
CLIP APPLIE ROT 10 11.4 M/L (STAPLE) ×1 IMPLANT
DECANTER SPIKE VIAL GLASS SM (MISCELLANEOUS) ×6 IMPLANT
DERMABOND ADVANCED (GAUZE/BANDAGES/DRESSINGS) ×2
DERMABOND ADVANCED .7 DNX12 (GAUZE/BANDAGES/DRESSINGS) ×1 IMPLANT
DEVICE PMI PUNCTURE CLOSURE (MISCELLANEOUS) ×3 IMPLANT
DRESSING SURGICEL FIBRLLR 1X2 (HEMOSTASIS) IMPLANT
DRSG SURGICEL FIBRILLAR 1X2 (HEMOSTASIS)
ELECT REM PT RETURN 9FT ADLT (ELECTROSURGICAL) ×3
ELECTRODE REM PT RTRN 9FT ADLT (ELECTROSURGICAL) ×1 IMPLANT
GLOVE BIO SURGEON STRL SZ7 (GLOVE) ×12 IMPLANT
GLOVE BIOGEL PI IND STRL 7.5 (GLOVE) ×3 IMPLANT
GLOVE BIOGEL PI INDICATOR 7.5 (GLOVE) ×6
GLOVE INDICATOR 7.5 STRL GRN (GLOVE) ×3 IMPLANT
GOWN STRL REUS W/ TWL LRG LVL3 (GOWN DISPOSABLE) ×3 IMPLANT
GOWN STRL REUS W/TWL LRG LVL3 (GOWN DISPOSABLE) ×6
IRRIGATION STRYKERFLOW (MISCELLANEOUS) IMPLANT
IRRIGATOR STRYKERFLOW (MISCELLANEOUS)
IV NS 1000ML (IV SOLUTION) ×2
IV NS 1000ML BAXH (IV SOLUTION) ×1 IMPLANT
KIT TURNOVER KIT A (KITS) ×3 IMPLANT
L-HOOK LAP DISP 36CM (ELECTROSURGICAL) ×3
LHOOK LAP DISP 36CM (ELECTROSURGICAL) ×1 IMPLANT
NEEDLE HYPO 22GX1.5 SAFETY (NEEDLE) ×3 IMPLANT
NEEDLE INSUFFLATION 14GA 120MM (NEEDLE) ×3 IMPLANT
NS IRRIG 1000ML POUR BTL (IV SOLUTION) ×3 IMPLANT
PACK LAP CHOLECYSTECTOMY (MISCELLANEOUS) ×3 IMPLANT
PENCIL ELECTRO HAND CTR (MISCELLANEOUS) ×3 IMPLANT
POUCH SPECIMEN RETRIEVAL 10MM (ENDOMECHANICALS) ×3 IMPLANT
SCISSORS METZENBAUM CVD 33 (INSTRUMENTS) ×3 IMPLANT
SLEEVE ENDOPATH XCEL 5M (ENDOMECHANICALS) ×6 IMPLANT
SUT MNCRL AB 4-0 PS2 18 (SUTURE) ×3 IMPLANT
SUT VICRYL 0 UR6 27IN ABS (SUTURE) ×3 IMPLANT
SUT VICRYL AB 3-0 FS1 BRD 27IN (SUTURE) ×3 IMPLANT
TROCAR XCEL NON-BLD 11X100MML (ENDOMECHANICALS) ×3 IMPLANT
TROCAR XCEL NON-BLD 5MMX100MML (ENDOMECHANICALS) ×3 IMPLANT
TUBING INSUFFLATION (TUBING) ×3 IMPLANT

## 2017-05-15 NOTE — Progress Notes (Signed)
Pt has [facility] C-5 CPAP. CPAP plugged into red outlet.

## 2017-05-15 NOTE — Op Note (Addendum)
SURGICAL OPERATIVE REPORT   DATE OF PROCEDURE: 05/15/2017  ATTENDING Surgeon(s): Vickie Epley, MD  ANESTHESIA: GETA  PRE-OPERATIVE DIAGNOSIS: Choledocholithiasis without Acute Cholecystitis (K80.50)  POST-OPERATIVE DIAGNOSIS: Choledocholithiasis with Acute Cholecystitis (K80.63)  PROCEDURE(S): (cpt's: 19379) 1.) Laparoscopic Cholecystectomy 2.) Intra-operative Cholangiogram  INTRAOPERATIVE FINDINGS: Moderate inflammation of gallbladder with moderately severe pericholecystic inflammation and widely patent well-opacified cystic duct and CBD without any appreciable filling defect(s) and with emptying of biliary contrast into duodenum, limited visualization of more proximal hepatic ducts  INTRAOPERATIVE FLUIDS: 700 mL crystalloid   ESTIMATED BLOOD LOSS: Minimal (<30 mL)  CONTRAST USED: 12 mL  FLUOROSCOPY TIME: 14 seconds  URINE OUTPUT: No foley  SPECIMENS: Gallbladder  IMPLANTS: None  DRAINS: None   COMPLICATIONS: None apparent   CONDITION AT COMPLETION: Hemodynamically stable and extubated  DISPOSITION: PACU   INDICATION(S) FOR PROCEDURE:  Patient is a 77 y.o. female who this admission presented with abdominal pain. Ultrasound suggested cholelithiasis without cholecystitis, but t bilirubin was 1.6 with lipase 110's and slightly elevated alkaline phosphatase. Patient was admitted for observation and repeat am LFT's, after which lipase normalized, but LFT's increased, including t bilirubin to 3.8. MRCP clearly demonstrated CBD filling defect, for which patient underwent ERCP with successful CBD stone extraction. All risks, benefits, and alternatives to above elective procedures were discussed with the patient, who elected to proceed, and informed consent was accordingly obtained at that time.   DETAILS OF PROCEDURE:  Patient was brought to the operating suite and appropriately identified. General anesthesia was administered along with peri-operative prophylactic IV  antibiotics, and endotracheal intubation was performed by anesthesiologist, along with NG/OG tube for gastric decompression. In supine position, operative site was prepped and draped in usual sterile fashion, and following a brief time out, initial 5 mm incision was made in a natural skin crease just above the umbilicus. Fascia was then elevated, and a Verress needle was inserted and its proper position confirmed using aspiration and saline meniscus test.  Upon insufflation of the abdominal cavity with carbon dioxide to a well-tolerated pressure of 12-15 mmHg, 5 mm peri-umbilical port followed by laparoscope were inserted and used to inspect the abdominal cavity and its contents with no injuries from insertion of the first trochar noted. Three additional trocars were inserted, one at the epigastric position (10 mm) and two along the Right costal margin (5 mm). The table was then placed in reverse Trendelenburg position with the Right side up. Filmy adhesions between the gallbladder and omentum/duodenum/transverse colon were lysed using combined blunt dissection and selective electrocautery. The apex/dome of the gallbladder was grasped with an atraumatic grasper passed through the lateral port and retracted apically over the liver. The infundibulum was also grasped and retracted, exposing Calot's triangle. The peritoneum overlying the gallbladder infundibulum was incised and dissected free of surrounding peritoneal attachments, revealing the cystic duct and cystic artery. The gallbladder specimen side of the cystic duct was clipped once. Cystic ductotomy was then made using endoshear scissors, and cholangiocatheter was inserted into the cystic duct, through which cholangiogram was performed, revealing widely patent well-opacified cystic duct and CBD without any appreciable filling defect(s) and with emptying of biliary contrast into duodenum, limited visualization of more proximal hepatic ducts. The patient side of  the cystic duct was triply clipped, while the cystic artery was clipped twice on the patient side and once on the gallbladder specimen side close to the gallbladder. The gallbladder was then dissected from its peritoneal attachments to the liver using electrocautery, and the gallbladder was  placed into a laparoscopic specimen bag and removed from the abdominal cavity via the epigastric port site. Hemostasis and secure placement of clips were confirmed, and intra-peritoneal cavity was inspected with no additional findings. PMI laparoscopic fascial closure device was then used to re-approximate fascia at the 10 mm epigastric port site.  All ports were then removed under direct visualization, and abdominal cavity was desuflated. All port sites were irrigated/cleaned, additional local anesthetic was injected at each incision, 3-0 Vicryl was used to re-approximate dermis at 10 mm port site(s), and subcuticular 4-0 Monocryl suture was used to re-approximate skin. Skin was then cleaned, dried, and sterile skin glue was applied. Patient was then safely able to be awakened, extubated, and transferred to PACU for post-operative monitoring and care.   I was present for all aspects of procedure, and there were no intra-operative complications apparent.

## 2017-05-15 NOTE — Anesthesia Post-op Follow-up Note (Signed)
Anesthesia QCDR form completed.        

## 2017-05-15 NOTE — Interval H&P Note (Signed)
History and Physical Interval Note:  05/15/2017 1:59 PM  Alyssa Duncan  has presented today for surgery, with the diagnosis of n/a  The various methods of treatment have been discussed with the patient and family. After consideration of risks, benefits and other options for treatment, the patient has consented to  Procedure(s): LAPAROSCOPIC CHOLECYSTECTOMY (N/A) as a surgical intervention .  The patient's history has been reviewed, patient examined, no change in status, stable for surgery.  I have reviewed the patient's chart and labs.  Questions were answered to the patient's satisfaction.     Vickie Epley

## 2017-05-15 NOTE — Anesthesia Preprocedure Evaluation (Addendum)
Anesthesia Evaluation  Patient identified by MRN, date of birth, ID band Patient awake    Reviewed: Allergy & Precautions, H&P , NPO status , Patient's Chart, lab work & pertinent test results  History of Anesthesia Complications (+) DIFFICULT AIRWAY, Family history of anesthesia reaction and history of anesthetic complications  Airway Mallampati: III  TM Distance: <3 FB Neck ROM: full    Dental  (+) Chipped, Poor Dentition   Pulmonary neg shortness of breath, asthma , sleep apnea and Continuous Positive Airway Pressure Ventilation , COPD,           Cardiovascular Exercise Tolerance: Good hypertension, (-) angina(-) Past MI and (-) DOE + dysrhythmias Atrial Fibrillation      Neuro/Psych PSYCHIATRIC DISORDERS negative neurological ROS     GI/Hepatic negative GI ROS, Neg liver ROS, GERD  Medicated and Controlled,  Endo/Other  Hypothyroidism   Renal/GU Renal disease  negative genitourinary   Musculoskeletal  (+) Arthritis ,   Abdominal   Peds  Hematology negative hematology ROS (+)   Anesthesia Other Findings Patient is NPO appropriate and reports no nausea or vomiting today.  Past Medical History: No date: Arthritis No date: Asthma No date: Back pain 2013: Breast cancer ([place])     Comment:  left breast ductal and lobular carcinoma 12/2015: Dysrhythmia     Comment:  A-fib No date: Family history of adverse reaction to anesthesia     Comment:  dad had hard time waking up from anesthesia No date: GERD (gastroesophageal reflux disease) No date: Hyperlipidemia No date: Hypertension No date: Hypothyroidism 12/2015: Kidney stones 2013: Personal history of radiation therapy     Comment:  BREAST CA No date: Shingles 2005: Skin cancer     Comment:  left arm and nose No date: Sleep apnea  Past Surgical History: 2013: BREAST BIOPSY; Left     Comment:  POS 2013: BREAST LUMPECTOMY; Left     Comment:  with rad  tx No date: CARPAL TUNNEL RELEASE No date: CATARACT EXTRACTION 05/10/2015: COLONOSCOPY WITH PROPOFOL; N/A     Comment:  Procedure: COLONOSCOPY WITH PROPOFOL;  Surgeon: Hulen Luster, MD;  Location: [facility];  Service:               Gastroenterology;  Laterality: N/A; 01/02/2016: CYSTOSCOPY W/ RETROGRADES; Bilateral     Comment:  Procedure: CYSTOSCOPY WITH RETROGRADE PYELOGRAM;                Surgeon: Hollice Espy, MD;  Location: [facility];                Service: Urology;  Laterality: Bilateral; 01/02/2016: CYSTOSCOPY W/ URETERAL STENT PLACEMENT; Right     Comment:  Procedure: CYSTOSCOPY WITH STENT REPLACEMENT;  Surgeon:               Hollice Espy, MD;  Location: [facility];  Service:               Urology;  Laterality: Right; 12/04/2015: CYSTOSCOPY WITH STENT PLACEMENT; Right     Comment:  Procedure: CYSTOSCOPY WITH STENT PLACEMENT;  Surgeon:               Hollice Espy, MD;  Location: [facility];  Service:               Urology;  Laterality: Right; 2013: JOINT REPLACEMENT; Left     Comment:  hip No date: KNEE ARTHROSCOPY No date:  SEPTOPLASTY 01/02/2016: TRANSURETHRAL RESECTION OF BLADDER TUMOR WITH MITOMYCIN-C;  N/A     Comment:  Procedure: TRANSURETHRAL RESECTION OF BLADDER TUMOR WITH              MITOMYCIN-C;  Surgeon: Hollice Espy, MD;  Location:               [facility];  Service: Urology;  Laterality: N/A; 01/02/2016: URETEROSCOPY WITH HOLMIUM LASER LITHOTRIPSY; Right     Comment:  Procedure: URETEROSCOPY WITH HOLMIUM LASER LITHOTRIPSY;               Surgeon: Hollice Espy, MD;  Location: [facility];                Service: Urology;  Laterality: Right; No date: UVULOPALATOPHARYNGOPLASTY     Comment:  and tongue surgery  BMI    Body Mass Index:  36.64 kg/m      Reproductive/Obstetrics negative OB ROS                             Anesthesia Physical  Anesthesia Plan  ASA: III  Anesthesia Plan: General ETT   Post-op Pain  Management:    Induction: Intravenous  PONV Risk Score and Plan: Ondansetron, Dexamethasone, Midazolam and Treatment may vary due to age or medical condition  Airway Management Planned: Video Laryngoscope Planned and Oral ETT  Additional Equipment:   Intra-op Plan:   Post-operative Plan: Extubation in OR  Informed Consent: I have reviewed the patients History and Physical, chart, labs and discussed the procedure including the risks, benefits and alternatives for the proposed anesthesia with the patient or authorized representative who has indicated his/her understanding and acceptance.   Dental Advisory Given  Plan Discussed with: Anesthesiologist, CRNA and Surgeon  Anesthesia Plan Comments: (Patient consented for risks of anesthesia including but not limited to:  - adverse reactions to medications - damage to teeth, lips or other oral mucosa - sore throat or hoarseness - Damage to heart, brain, lungs or loss of life  Patient voiced understanding.)       Anesthesia Quick Evaluation

## 2017-05-15 NOTE — Progress Notes (Signed)
[name] NAME: Alyssa Duncan    MR#:  [id number]  DATE OF BIRTH:  [birth year]  SUBJECTIVE:  Patient came in with right upper quadrant abdominal pain.  She is pain-free right now.  No nausea vomiting Ambulated in the room REVIEW OF SYSTEMS:   Review of Systems  Constitutional: Negative for chills, fever and weight loss.  HENT: Negative for ear discharge, ear pain and nosebleeds.   Eyes: Negative for blurred vision, pain and discharge.  Respiratory: Negative for sputum production, shortness of breath, wheezing and stridor.   Cardiovascular: Negative for chest pain, palpitations, orthopnea and PND.  Gastrointestinal: Negative for abdominal pain, diarrhea, nausea and vomiting.  Genitourinary: Negative for frequency and urgency.  Musculoskeletal: Negative for back pain and joint pain.  Neurological: Positive for weakness. Negative for sensory change, speech change and focal weakness.  Psychiatric/Behavioral: Negative for depression and hallucinations. The patient is not nervous/anxious.    Tolerating Diet:npo Tolerating PT: ambulaotry  DRUG ALLERGIES:   Allergies  Allergen Reactions  . Ativan [Lorazepam]     Hysteria   . Cardura [Doxazosin] Other (See Comments)    unsure  . Clonidine Derivatives Other (See Comments)    unsure  . Enalapril Other (See Comments)    unsure  . Levaquin [Levofloxacin] Other (See Comments)    Couldn't raise her arms  . Mobic [Meloxicam] Other (See Comments)    unsure  . Sulfa Antibiotics Nausea Only  . Vicodin [Hydrocodone-Acetaminophen] Other (See Comments)    unsure    VITALS:  Blood pressure (!) 160/75, pulse 62, temperature (!) 96.7 F (35.9 C), temperature source Tympanic, resp. rate 18, height 5\' 2"  (1.575 m), weight 88.2 kg (194 lb 6.4 oz), SpO2 99 %.  PHYSICAL EXAMINATION:   Physical Exam  GENERAL:  77 y.o.-year-old patient lying in the bed with no acute distress.  EYES:  Pupils equal, round, reactive to light and accommodation. No scleral icterus. Extraocular muscles intact.  HEENT: Head atraumatic, normocephalic. Oropharynx and nasopharynx clear.  NECK:  Supple, no jugular venous distention. No thyroid enlargement, no tenderness.  LUNGS: Normal breath sounds bilaterally, no wheezing, rales, rhonchi. No use of accessory muscles of respiration.  CARDIOVASCULAR: S1, S2 normal. No murmurs, rubs, or gallops.  ABDOMEN: Soft, nontender, nondistended. Bowel sounds present. No organomegaly or mass.  EXTREMITIES: No cyanosis, clubbing or edema b/l.    NEUROLOGIC: Cranial nerves II through XII are intact. No focal Motor or sensory deficits b/l.   PSYCHIATRIC:  patient is alert and oriented x 3.  SKIN: No obvious rash, lesion, or ulcer.   LABORATORY PANEL:  CBC Recent Labs  Lab 05/15/17 0440  WBC 10.6  HGB 13.6  HCT 41.7  PLT 311    Chemistries  Recent Labs  Lab 05/15/17 0440  NA 140  K 3.6  CL 101  CO2 28  GLUCOSE 153*  BUN 11  CREATININE 0.80  CALCIUM 8.7*  AST 112*  ALT 186*  ALKPHOS 272*  BILITOT 1.4*   Cardiac Enzymes No results for input(s): TROPONINI in the last 168 hours. RADIOLOGY:  Dg C-arm 1-60 Min-no Report  Result Date: 05/14/2017 Fluoroscopy was utilized by the requesting physician.  No radiographic interpretation.   Mr Abdomen Mrcp W Wo Contast  Result Date: 05/13/2017 CLINICAL DATA:  Acute onset right upper quadrant pain with nausea EXAM: MRI ABDOMEN WITHOUT AND WITH CONTRAST (INCLUDING MRCP) TECHNIQUE: Multiplanar multisequence MR imaging of the abdomen was performed both before and  after the administration of intravenous contrast. Heavily T2-weighted images of the biliary and pancreatic ducts were obtained, and three-dimensional MRCP images were rendered by post processing. CONTRAST:  53mL MULTIHANCE GADOBENATE DIMEGLUMINE 529 MG/ML IV SOLN COMPARISON:  Ultrasound 05/12/2017, CT 12/03/2015 FINDINGS: Lower chest: No acute findings.  Hepatobiliary: Hepatic steatosis. No focal hepatic abnormality. Enlarged gallbladder measuring up to 4.2 cm. Multiple stones in the gallbladder. No surrounding fluid or inflammatory changes. No intra hepatic biliary ductal dilatation. Borderline to mild enlargement of extrahepatic common bile duct, measuring up to 8 mm. 6 mm hypointense filling defect within the mid to distal common duct consistent with ductal stone, series 9, image number 45. Pancreas: No mass, inflammatory changes, or other parenchymal abnormality identified. Spleen:  Within normal limits in size and appearance. Adrenals/Urinary Tract: Adrenal glands are within normal limits. Tiny cysts within the right kidney 9 mm cyst lower pole left kidney, best seen on coronal views. No hydronephrosis. Nonspecific mild perinephric edema. Probable mild scarring at the lower pole of the left kidney. Stomach/Bowel: Visualized portions within the abdomen are unremarkable. Vascular/Lymphatic: No pathologically enlarged lymph nodes identified. No abdominal aortic aneurysm demonstrated. Other:  None. Musculoskeletal: No suspicious bone lesions identified. IMPRESSION: 1. Dilated gallbladder with multiple gallstones but negative for pericholecystic fluid or gallbladder wall thickening 2. Borderline to slightly enlarged common bile duct with 6 mm stone visualized in the mid to distal common bile duct. 3. Hepatic steatosis Electronically Signed   By: Donavan Foil M.D.   On: 05/13/2017 21:42   ASSESSMENT AND PLAN:  Alyssa Duncan  is a 77 y.o. female with a known history of A. fib, hypertension, history of breast cancer presents to the hospital complaining of acute onset of right upper quadrant pain, nausea after eating a meal.  Patient had milder symptoms last week after a meal which resolved on its own with taking Tums  *Cholelithiasis with elevated LFTs.  Likely has symptomatic gallstones.   -With normal CBD unlikely to have a stone there.  - repeat LFTs show  increased bilirubin---MRCP for dilatation of the CBD.  Patient is status post ERCP with one large stone removal by Dr. Allen Norris -  appreciate surgical input--- patient is scheduled for lap chole today.  *Atrial fibrillation.  Continue aspirin and rate control medications. -No history of coronary artery disease.  Echo shows EF of 55-60%.  She has a had a stress echo in 2016 which was unremarkable.  Patient denies any chest pain.  She understands the risks and benefits for surgery.  She is at intermediate risk for surgery.  This was discussed with patient and family.  *Hypertension.  On home medications.  Add IV hydralazine.  DVT prophylaxis with Lovenox--change to SCD  D/c pt and daughter  Case discussed with Care Management/Social Worker. Management plans discussed with the patient, family and they are in agreement.  CODE STATUS: full  DVT Prophylaxis: lovenox d/c--SCD for now since pt may need ERCP/GB sx  TOTAL TIME TAKING CARE OF THIS PATIENT: 25 minutes.  >50% time spent on counselling and coordination of care  POSSIBLE D/C IN 1-2 DAYS, DEPENDING ON CLINICAL CONDITION.  Note: This dictation was prepared with Dragon dictation along with smaller phrase technology. Any transcriptional errors that result from this process are unintentional.  Fritzi Mandes M.D on 05/15/2017 at 2:51 PM  Between 7am to 6pm - Pager - [phone]  After 6pm go to [email or web address] - password [email or web address] [facility]  Office  [phone]  CC: Primary care  physician; Adin Hector, MDPatient ID: Waymon Budge, female   DOB: [birth year], 77 y.o.   MRN: [id number]

## 2017-05-15 NOTE — Transfer of Care (Signed)
Immediate Anesthesia Transfer of Care Note  Patient: Alyssa Duncan  Procedure(s) Performed: LAPAROSCOPIC CHOLECYSTECTOMY WITH INTRAOPERATIVE CHOLANGIOGRAM (N/A )  Patient Location: PACU  Anesthesia Type:General  Level of Consciousness: sedated  Airway & Oxygen Therapy: Patient Spontanous Breathing and Patient connected to face mask oxygen  Post-op Assessment: Report given to RN and Post -op Vital signs reviewed and stable  Post vital signs: Reviewed and stable  Last Vitals:  Vitals:   05/15/17 1320 05/15/17 1604  BP: (!) 160/75 (!) 132/49  Pulse: 62 66  Resp: 18 15  Temp: (!) 35.9 C 36.7 C  SpO2: 99% 93%    Last Pain:  Vitals:   05/15/17 1320  TempSrc: Tympanic  PainSc:       Patients Stated Pain Goal: 1 (41/36/43 8377)  Complications: No apparent anesthesia complications

## 2017-05-15 NOTE — Anesthesia Procedure Notes (Signed)
Procedure Name: Intubation Date/Time: 05/15/2017 2:41 PM Performed by: Nelda Marseille, CRNA Pre-anesthesia Checklist: Patient identified, Patient being monitored, Timeout performed, Emergency Drugs available and Suction available Patient Re-evaluated:Patient Re-evaluated prior to induction Oxygen Delivery Method: Circle system utilized Preoxygenation: Pre-oxygenation with 100% oxygen Induction Type: IV induction Ventilation: Mask ventilation without difficulty Laryngoscope Size: Mac, 3 and McGraph Grade View: Grade III Tube type: Oral Tube size: 7.0 mm Number of attempts: 1 Airway Equipment and Method: Stylet Placement Confirmation: ETT inserted through vocal cords under direct vision,  positive ETCO2 and breath sounds checked- equal and bilateral Secured at: 21 cm Tube secured with: Tape Dental Injury: Teeth and Oropharynx as per pre-operative assessment

## 2017-05-16 DIAGNOSIS — K8063 Calculus of gallbladder and bile duct with acute cholecystitis with obstruction: Secondary | ICD-10-CM

## 2017-05-16 DIAGNOSIS — K851 Biliary acute pancreatitis without necrosis or infection: Secondary | ICD-10-CM

## 2017-05-16 LAB — COMPREHENSIVE METABOLIC PANEL
ALK PHOS: 228 U/L — AB (ref 38–126)
ALT: 134 U/L — AB (ref 14–54)
AST: 62 U/L — AB (ref 15–41)
Albumin: 3.1 g/dL — ABNORMAL LOW (ref 3.5–5.0)
Anion gap: 13 (ref 5–15)
BILIRUBIN TOTAL: 0.8 mg/dL (ref 0.3–1.2)
BUN: 12 mg/dL (ref 6–20)
CALCIUM: 8.2 mg/dL — AB (ref 8.9–10.3)
CO2: 26 mmol/L (ref 22–32)
CREATININE: 0.86 mg/dL (ref 0.44–1.00)
Chloride: 100 mmol/L — ABNORMAL LOW (ref 101–111)
GFR calc Af Amer: >60 mL/min (ref >60)
Glucose, Bld: 210 mg/dL — ABNORMAL HIGH (ref 65–99)
Potassium: 3.3 mmol/L — ABNORMAL LOW (ref 3.5–5.1)
Sodium: 139 mmol/L (ref 135–145)
TOTAL PROTEIN: 6.7 g/dL (ref 6.5–8.1)

## 2017-05-16 NOTE — Discharge Summary (Signed)
[facility] at [place] NAME: Alyssa Duncan    MR#:  [id number]  DATE OF BIRTH:  [birth year]  DATE OF ADMISSION:  05/12/2017 ADMITTING PHYSICIAN: Hillary Bow, MD  DATE OF DISCHARGE: 05/16/2017  PRIMARY CARE PHYSICIAN: Adin Hector, MD    ADMISSION DIAGNOSIS:  Choledocholithiasis [K80.50] Pain [R52] Acute biliary pancreatitis, unspecified complication status [G29.52]  DISCHARGE DIAGNOSIS:  Acute Choledocholithiasis/Cholelithiasis s/p ERCP with stone removal and s/p Lap chole  SECONDARY DIAGNOSIS:   Past Medical History:  Diagnosis Date  . Arthritis   . Asthma   . Back pain   . Breast cancer ([place]) 2013   left breast ductal and lobular carcinoma  . Difficult intubation   . Dysrhythmia 12/2015   A-fib  . Family history of adverse reaction to anesthesia    dad had hard time waking up from anesthesia  . GERD (gastroesophageal reflux disease)   . Hyperlipidemia   . Hypertension   . Hypothyroidism   . Kidney stones 12/2015  . Personal history of radiation therapy 2013   BREAST CA  . Shingles   . Skin cancer 2005   left arm and nose  . Sleep apnea     HOSPITAL COURSE:  marshelle bilger y.o.femalewith a known history of A. fib, hypertension, history of breast cancer presents to the hospital complaining of acute onset of right upper quadrant pain, nausea after eating a meal. Patient had milder symptoms last week after a meal which resolved on its own with taking Tums  *Acute Choledocholithiasis/ cholelithiaisis with elevated LFTs.   - repeat LFTs show increased bilirubin---MRCP for dilatation of the CBD.  Patient is status post ERCP with one large stone removal by Dr. Allen Norris - appreciate surgical input--- patient  POD #1 lap chole -doing well -LFTs look better  *Atrial fibrillation. Continue aspirin and rate control medications. -No history of coronary artery disease.  Echo shows EF of 55-60%.  She has a had a  stress echo in 2016 which was unremarkable.  Patient denies any chest pain.  She understands the risks and benefits for surgery.  She is at intermediate risk for surgery.  This was discussed with patient and family.  *Hypertension. On home medications. Add IV hydralazine.  DVT prophylaxis with Lovenox--change to SCD  Overall stable D/c home [place] with Dr Rosana Hoes  CONSULTS OBTAINED:  Treatment Team:  Vickie Epley, MD  DRUG ALLERGIES:   Allergies  Allergen Reactions  . Ativan [Lorazepam]     Hysteria   . Cardura [Doxazosin] Other (See Comments)    unsure  . Clonidine Derivatives Other (See Comments)    unsure  . Enalapril Other (See Comments)    unsure  . Levaquin [Levofloxacin] Other (See Comments)    Couldn't raise her arms  . Mobic [Meloxicam] Other (See Comments)    unsure  . Sulfa Antibiotics Nausea Only  . Vicodin [Hydrocodone-Acetaminophen] Other (See Comments)    unsure    DISCHARGE MEDICATIONS:   Allergies as of 05/16/2017      Reactions   Ativan [lorazepam]    Hysteria    Cardura [doxazosin] Other (See Comments)   unsure   Clonidine Derivatives Other (See Comments)   unsure   Enalapril Other (See Comments)   unsure   Levaquin [levofloxacin] Other (See Comments)   Couldn't raise her arms   Mobic [meloxicam] Other (See Comments)   unsure   Sulfa Antibiotics Nausea Only   Vicodin [hydrocodone-acetaminophen] Other (See Comments)  unsure      Medication List    TAKE these medications   amLODipine 5 MG tablet Commonly known as:  NORVASC Take 5 mg by mouth every morning.   anastrozole 1 MG tablet Commonly known as:  ARIMIDEX TAKE 1 TABLET DAILY   aspirin EC 81 MG tablet Take 1 tablet (81 mg total) by mouth daily.   CALCIUM 600+D3 600-800 MG-UNIT Tabs Generic drug:  Calcium Carb-Cholecalciferol Take 1 tablet by mouth 2 (two) times daily.   DORZOLAMIDE HCL-TIMOLOL MAL OP Place 1 drop into both eyes 2 (two) times daily.    hydrochlorothiazide 25 MG tablet Commonly known as:  HYDRODIURIL Take 25 mg by mouth daily.   ibuprofen 200 MG tablet Commonly known as:  ADVIL,MOTRIN Take 400 mg by mouth every 6 (six) hours as needed.   levothyroxine 75 MCG tablet Commonly known as:  SYNTHROID, LEVOTHROID Take 75 mcg by mouth daily before breakfast.   loratadine 10 MG tablet Commonly known as:  CLARITIN Take 10 mg by mouth daily.   losartan 100 MG tablet Commonly known as:  COZAAR Take 100 mg by mouth every morning.   lovastatin 40 MG tablet Commonly known as:  MEVACOR Take 40 mg by mouth at bedtime.   LUMIGAN 0.01 % Soln Generic drug:  bimatoprost Place 1 drop into both eyes at bedtime.   metoprolol tartrate 100 MG tablet Commonly known as:  LOPRESSOR Take 100 mg by mouth 2 (two) times daily.   pantoprazole 40 MG tablet Commonly known as:  PROTONIX Take 40 mg by mouth daily.   potassium chloride SA 20 MEQ tablet Commonly known as:  K-DUR,KLOR-CON Take 20 mEq by mouth 2 (two) times daily.   vitamin B-12 1000 MCG tablet Commonly known as:  CYANOCOBALAMIN Take 1,000 mcg by mouth daily.   vitamin C 500 MG tablet Commonly known as:  ASCORBIC ACID Take 500 mg by mouth daily.       If you experience worsening of your admission symptoms, develop shortness of breath, life threatening emergency, suicidal or homicidal thoughts you must seek medical attention immediately by calling 911 or calling your MD immediately  if symptoms less severe.  You Must read complete instructions/literature along with all the possible adverse reactions/side effects for all the Medicines you take and that have been prescribed to you. Take any new Medicines after you have completely understood and accept all the possible adverse reactions/side effects.   Please note  You were cared for by a hospitalist during your hospital stay. If you have any questions about your discharge medications or the care you received while you  were in the hospital after you are discharged, you can call the unit and asked to speak with the hospitalist on call if the hospitalist that took care of you is not available. Once you are discharged, your primary care physician will handle any further medical issues. Please note that NO REFILLS for any discharge medications will be authorized once you are discharged, as it is imperative that you return to your primary care physician (or establish a relationship with a primary care physician if you do not have one) for your aftercare needs so that they can reassess your need for medications and monitor your lab values. Today   SUBJECTIVE   Doing well Family in the room  VITAL SIGNS:  Blood pressure (!) 178/62, pulse 65, temperature 97.9 F (36.6 C), temperature source Oral, resp. rate 18, height 5\' 2"  (1.575 m), weight 91.7 kg (202 lb  1.6 oz), SpO2 95 %.  I/O:    Intake/Output Summary (Last 24 hours) at 05/16/2017 1002 Last data filed at 05/16/2017 0959 Gross per 24 hour  Intake 2657 ml  Output 1835 ml  Net 822 ml    PHYSICAL EXAMINATION:  GENERAL:  77 y.o.-year-old patient lying in the bed with no acute distress.  EYES: Pupils equal, round, reactive to light and accommodation. No scleral icterus. Extraocular muscles intact.  HEENT: Head atraumatic, normocephalic. Oropharynx and nasopharynx clear.  NECK:  Supple, no jugular venous distention. No thyroid enlargement, no tenderness.  LUNGS: Normal breath sounds bilaterally, no wheezing, rales,rhonchi or crepitation. No use of accessory muscles of respiration.  CARDIOVASCULAR: S1, S2 normal. No murmurs, rubs, or gallops.  ABDOMEN: Soft, non-tender, non-distended. Bowel sounds present. No organomegaly or mass.  EXTREMITIES: No pedal edema, cyanosis, or clubbing.  NEUROLOGIC: Cranial nerves II through XII are intact. Muscle strength 5/5 in all extremities. Sensation intact. Gait not checked.  PSYCHIATRIC: The patient is alert and oriented x  3.  SKIN: No obvious rash, lesion, or ulcer.   DATA REVIEW:   CBC  Recent Labs  Lab 05/15/17 0440  WBC 10.6  HGB 13.6  HCT 41.7  PLT 311    Chemistries  Recent Labs  Lab 05/16/17 0740  NA 139  K 3.3*  CL 100*  CO2 26  GLUCOSE 210*  BUN 12  CREATININE 0.86  CALCIUM 8.2*  AST 62*  ALT 134*  ALKPHOS 228*  BILITOT 0.8    Microbiology Results   Recent Results (from the past 240 hour(s))  MRSA PCR Screening     Status: None   Collection Time: 05/15/17  8:19 AM  Result Value Ref Range Status   MRSA by PCR NEGATIVE NEGATIVE Final    Comment:        The GeneXpert MRSA Assay (FDA approved for NASAL specimens only), is one component of a comprehensive MRSA colonization surveillance program. It is not intended to diagnose MRSA infection nor to guide or monitor treatment for MRSA infections. Performed at [facility], [address] [place], [place] [address]     RADIOLOGY:  Dg Cholangiogram Operative  Result Date: 05/15/2017 CLINICAL DATA:  Cholelithiasis. EXAM: INTRAOPERATIVE CHOLANGIOGRAM TECHNIQUE: Cholangiographic images from the C-arm fluoroscopic device were submitted for interpretation post-operatively. Please see the procedural report for the amount of contrast and the fluoroscopy time utilized. COMPARISON:  ERCP 05/14/2017 FINDINGS: Dilatation of the common bile duct. Contrast drains into the duodenum. There is a persistent filling defect in the cystic duct which is nonobstructive. IMPRESSION: Common bile duct is patent with mild dilatation. Nonobstructive stone in the cystic duct. Electronically Signed   By: Markus Daft M.D.   On: 05/15/2017 16:52     Management plans discussed with the patient, family and they are in agreement.  CODE STATUS:     Code Status Orders  (From admission, onward)        Start     Ordered   05/12/17 1924  Full code  Continuous     05/12/17 1924    Code Status History    Date Active Date Inactive Code Status  Order ID Comments User Context   12/03/2015 18:19 12/06/2015 12:44 Full Code [id number]  Vaughan Basta, MD Inpatient    Advance Directive Documentation     Most Recent Value  Type of Advance Directive  Healthcare Power of Attorney, Living will  Pre-existing out of facility DNR order (yellow form or pink MOST form)  No data  "MOST" Form in Place?  No data      TOTAL TIME TAKING CARE OF THIS PATIENT:40 minutes.    Fritzi Mandes M.D on 05/16/2017 at 10:02 AM  Between 7am to 6pm - Pager - [phone] After 6pm go to [email or web address] - password [email or web address] [facility]  Office  [phone]  CC: Primary care physician; Adin Hector, MD

## 2017-05-16 NOTE — Discharge Instructions (Signed)
In addition to included general post-operative instructions for Laparoscopic Cholecystectomy for Choledocholithiasis (bile duct gallstones) with Gallstone Pancreatitis and Cholecystitis (inflammation of gallbladder),  Diet: Resume home heart healthy diet.   Activity: No heavy lifting >20 pounds (children, pets, laundry, garbage) or strenuous activity until follow-up, but light activity and walking are encouraged. Do not drive or drink alcohol if taking narcotic pain medications.  Wound care: 2 days after surgery (Sunday, 2/10), may shower/get incision wet with soapy water and pat dry (do not rub incisions), but no baths or submerging incision underwater until follow-up.   Medications: Resume all home medications. For mild to moderate pain: acetaminophen (Tylenol) or ibuprofen/naproxen (if no kidney disease). Combining Tylenol with alcohol can substantially increase your risk of causing liver disease. Narcotic pain medications, if prescribed, can be used for severe pain, though may cause nausea, constipation, and drowsiness. Do not combine Tylenol and Percocet (or similar) within a 6 hour period as Percocet (and similar) contain(s) Tylenol. If you do not need the narcotic pain medication, you do not need to fill the prescription.  Call office [phone]) at any time if any questions, worsening pain, fevers/chills, bleeding, drainage from incision site, or other concerns.

## 2017-05-16 NOTE — Progress Notes (Signed)
Patient discharge teaching given, including activity, diet, follow-up appoints, and medications. Patient verbalized understanding of all discharge instructions. IV access was d/c'd. Vitals are stable. Skin is intact except as charted in most recent assessments. Pt to be escorted out by volunteer, to be driven home by family.  Brittony Billick  

## 2017-05-18 ENCOUNTER — Encounter: Payer: Self-pay | Admitting: Surgery

## 2017-05-18 NOTE — Anesthesia Postprocedure Evaluation (Signed)
Anesthesia Post Note  Patient: Alyssa Duncan  Procedure(s) Performed: LAPAROSCOPIC CHOLECYSTECTOMY WITH INTRAOPERATIVE CHOLANGIOGRAM (N/A )  Patient location during evaluation: PACU Anesthesia Type: General Level of consciousness: awake and alert and oriented Pain management: pain level controlled Vital Signs Assessment: post-procedure vital signs reviewed and stable Respiratory status: spontaneous breathing Cardiovascular status: blood pressure returned to baseline Anesthetic complications: no     Last Vitals:  Vitals:   05/16/17 0551 05/16/17 1011  BP: (!) 178/62 (!) 163/69  Pulse: 65   Resp: 18   Temp: 36.6 C   SpO2: 95%     Last Pain:  Vitals:   05/16/17 1011  TempSrc:   PainSc: 0-No pain                 Jojuan Champney

## 2017-05-19 LAB — SURGICAL PATHOLOGY

## 2017-06-01 ENCOUNTER — Ambulatory Visit (INDEPENDENT_AMBULATORY_CARE_PROVIDER_SITE_OTHER): Payer: Medicare Other | Admitting: Surgery

## 2017-06-01 ENCOUNTER — Encounter: Payer: Self-pay | Admitting: Surgery

## 2017-06-01 VITALS — BP 176/74 | HR 69 | Temp 97.4°F | Ht 62.0 in | Wt 201.6 lb

## 2017-06-01 DIAGNOSIS — K851 Biliary acute pancreatitis without necrosis or infection: Secondary | ICD-10-CM

## 2017-06-01 DIAGNOSIS — K8050 Calculus of bile duct without cholangitis or cholecystitis without obstruction: Secondary | ICD-10-CM

## 2017-06-01 NOTE — Progress Notes (Signed)
Surgical Clinic Progress/Follow-up Note   HPI:  77 y.o. Female presents to clinic for post-op follow-up evaluation s/p post-ERCP laparoscopic cholecystectomy for choledocholithiasis with cholecystitis. Patient reports complete resolution of pre-operative RUQ abdominal pain and has been tolerating regular diet with +flatus and normal BM's, denies any post-surgical abdominal pain except a single episode of mile epigastric peri-incisional pain yesterday, no N/V, fever/chills, CP, or SOB.  Review of Systems:  Constitutional: denies any other weight loss, fever, chills, or sweats  Eyes: denies any other vision changes, history of eye injury  ENT: denies sore throat, hearing problems  Respiratory: denies shortness of breath, wheezing  Cardiovascular: denies chest pain, palpitations  Gastrointestinal: abdominal pain, N/V, and bowel function as per HPI Musculoskeletal: denies any other joint pains or cramps  Skin: Denies any other rashes or skin discolorations  Neurological: denies any other headache, dizziness, weakness  Psychiatric: denies any other depression, anxiety  All other review of systems: otherwise negative   Vital Signs:  BP (!) 176/74   Pulse 69   Temp (!) 97.4 F (36.3 C) (Oral)   Ht 5\' 2"  (1.575 m)   Wt 201 lb 9.6 oz (91.4 kg)   BMI 36.87 kg/m    Physical Exam:  Constitutional:  -- Normal body habitus  -- Awake, alert, and oriented x3  Eyes:  -- Pupils equally round and reactive to light  -- No scleral icterus  Ear, nose, throat:  -- No jugular venous distension  -- No nasal drainage, bleeding Pulmonary:  -- No crackles -- Equal breath sounds bilaterally -- Breathing non-labored at rest Cardiovascular:  -- S1, S2 present  -- No pericardial rubs  Gastrointestinal:  -- Soft, nontender, non-distended, no guarding/rebound -- Incisions well-approximated without surrounding erythema or drainage -- No abdominal masses appreciated, pulsatile or otherwise   Musculoskeletal / Integumentary:  -- Wounds or skin discoloration: None appreciated except post-surgical wounds as described above (GI) -- Extremities: B/L UE and LE FROM, hands and feet warm, no edema  Neurologic:  -- Motor function: intact and symmetric  -- Sensation: intact and symmetric   Laboratory studies:  CBC Latest Ref Rng & Units 05/15/2017 05/14/2017 05/13/2017  WBC 3.6 - 11.0 K/uL 10.6 5.7 10.2  Hemoglobin 12.0 - 16.0 g/dL 13.6 14.0 13.7  Hematocrit 35.0 - 47.0 % 41.7 41.1 40.8  Platelets 150 - 440 K/uL 311 231 248   CMP Latest Ref Rng & Units 05/16/2017 05/15/2017 05/14/2017  Glucose 65 - 99 mg/dL 210(H) 153(H) 96  BUN 6 - 20 mg/dL 12 11 13   Creatinine 0.44 - 1.00 mg/dL 0.86 0.80 1.05(H)  Sodium 135 - 145 mmol/L 139 140 143  Potassium 3.5 - 5.1 mmol/L 3.3(L) 3.6 3.2(L)  Chloride 101 - 111 mmol/L 100(L) 101 101  CO2 22 - 32 mmol/L 26 28 28   Calcium 8.9 - 10.3 mg/dL 8.2(L) 8.7(L) 8.9  Total Protein 6.5 - 8.1 g/dL 6.7 7.1 7.0  Total Bilirubin 0.3 - 1.2 mg/dL 0.8 1.4(H) 2.4(H)  Alkaline Phos 38 - 126 U/L 228(H) 272(H) 268(H)  AST 15 - 41 U/L 62(H) 112(H) 107(H)  ALT 14 - 54 U/L 134(H) 186(H) 195(H)   Assessment:  77 y.o. yo Female with a problem list including...  Patient Active Problem List   Diagnosis Date Noted  . Choledocholithiasis   . Gallstone pancreatitis 05/13/2017  . History of acute pancreatitis 05/13/2017  . Acute biliary pancreatitis   . Cholelithiasis 05/12/2017  . 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 Malignant  neoplasm of left breast in female, estrogen receptor positive ([place]) 08/12/2016  . Morbid obesity ([place]) 08/12/2016  . History of bladder cancer 02/13/2016  . UTI (urinary tract infection) 12/07/2015  . Sepsis ([place]) 12/06/2015  . Bacteremia 12/06/2015  . Acute respiratory distress 12/06/2015  . New onset atrial fibrillation ([place]) 12/06/2015  . Acute delirium   . AKI (acute kidney injury) ([place])   . Hypokalemia   . Ureteral  stone 12/03/2015  . Cholelithiasis without cholecystitis 12/03/2015  . Nephrolithiasis 12/03/2015  . Allergic rhinitis 06/13/2015  . Arthritis 06/13/2015  . Asthma without status asthmaticus 06/13/2015  . Back ache 06/13/2015  . Malignant neoplastic disease ([place]) 06/13/2015  . Edema leg 06/13/2015  . Benign essential HTN 06/13/2015  . Acid reflux 06/13/2015  . Blood glucose elevated 06/13/2015  . HLD (hyperlipidemia) 06/13/2015  . Hypersomnia with sleep apnea 06/13/2015  . Adult hypothyroidism 06/13/2015  . Encounter for surgical follow-up care 05/17/2015  . History of repair of hip joint 05/17/2015    presents to clinic for post-op follow-up evaluation, doing well s/p post-ERCP laparoscopic cholecystectomy with intraoperative cholangiogram for choledocholithiasis and cholecystitis.  Plan:              - advance diet as tolerated             - okay to submerge incisions under water (baths, swimming) prn             - gradually resume all activities without restrictions over next 2 weeks             - apply sunblock particularly to incisions with sun exposure to reduce pigmentation of scars             - return to clinic as needed, instructed to call office if any questions or concerns  - follow up with GI s/p ERCP as instructed by GI  All of the above recommendations were discussed with the patient, and all of patient's questions were answered to her expressed satisfaction.  -- Marilynne Drivers Rosana Hoes, MD, [place]: [facility] - Partnering for exceptional care. Office: [phone]

## 2017-06-01 NOTE — Patient Instructions (Signed)

## 2017-06-15 ENCOUNTER — Other Ambulatory Visit: Payer: Self-pay | Admitting: *Deleted

## 2017-06-15 DIAGNOSIS — D0512 Intraductal carcinoma in situ of left breast: Secondary | ICD-10-CM

## 2017-06-16 ENCOUNTER — Encounter: Payer: Self-pay | Admitting: Hematology and Oncology

## 2017-06-16 ENCOUNTER — Inpatient Hospital Stay: Payer: Medicare Other | Attending: Hematology and Oncology | Admitting: Hematology and Oncology

## 2017-06-16 ENCOUNTER — Inpatient Hospital Stay: Payer: Medicare Other

## 2017-06-16 VITALS — BP 153/85 | HR 66 | Temp 98.0°F | Resp 18 | Wt 207.4 lb

## 2017-06-16 DIAGNOSIS — Z8551 Personal history of malignant neoplasm of bladder: Secondary | ICD-10-CM | POA: Insufficient documentation

## 2017-06-16 DIAGNOSIS — Z13820 Encounter for screening for osteoporosis: Secondary | ICD-10-CM

## 2017-06-16 DIAGNOSIS — D0512 Intraductal carcinoma in situ of left breast: Secondary | ICD-10-CM | POA: Diagnosis not present

## 2017-06-16 LAB — CBC WITH DIFFERENTIAL/PLATELET
Basophils Absolute: 0 10*3/uL (ref 0–0.1)
Basophils Relative: 0 %
Eosinophils Absolute: 0.6 10*3/uL (ref 0–0.7)
Eosinophils Relative: 7 %
HCT: 40.9 % (ref 35.0–47.0)
Hemoglobin: 14.1 g/dL (ref 12.0–16.0)
Lymphocytes Relative: 18 %
Lymphs Abs: 1.5 10*3/uL (ref 1.0–3.6)
MCH: 32 pg (ref 26.0–34.0)
MCHC: 34.4 g/dL (ref 32.0–36.0)
MCV: 93 fL (ref 80.0–100.0)
Monocytes Absolute: 0.5 10*3/uL (ref 0.2–0.9)
Monocytes Relative: 6 %
Neutro Abs: 5.5 10*3/uL (ref 1.4–6.5)
Neutrophils Relative %: 69 %
Platelets: 254 10*3/uL (ref 150–440)
RBC: 4.4 MIL/uL (ref 3.80–5.20)
RDW: 14.1 % (ref 11.5–14.5)
WBC: 8.1 10*3/uL (ref 3.6–11.0)

## 2017-06-16 LAB — COMPREHENSIVE METABOLIC PANEL
ALT: 41 U/L (ref 14–54)
AST: 42 U/L — ABNORMAL HIGH (ref 15–41)
Albumin: 3.6 g/dL (ref 3.5–5.0)
Alkaline Phosphatase: 91 U/L (ref 38–126)
Anion gap: 14 (ref 5–15)
BUN: 18 mg/dL (ref 6–20)
CO2: 26 mmol/L (ref 22–32)
Calcium: 8.9 mg/dL (ref 8.9–10.3)
Chloride: 103 mmol/L (ref 101–111)
Creatinine, Ser: 1.05 mg/dL — ABNORMAL HIGH (ref 0.44–1.00)
GFR calc Af Amer: 57 mL/min — ABNORMAL LOW (ref >60)
GFR calc non Af Amer: 50 mL/min — ABNORMAL LOW (ref >60)
Glucose, Bld: 163 mg/dL — ABNORMAL HIGH (ref 65–99)
Potassium: 3.8 mmol/L (ref 3.5–5.1)
Sodium: 143 mmol/L (ref 135–145)
Total Bilirubin: 0.5 mg/dL (ref 0.3–1.2)
Total Protein: 6.9 g/dL (ref 6.5–8.1)

## 2017-06-16 NOTE — Progress Notes (Signed)
Patient had core needle bx on her left breast in December and Gallbladder surgery in February.  Patient here today for 6 month follow up.

## 2017-06-16 NOTE — Progress Notes (Signed)
[facility] day:  06/16/2017  Chief Complaint: Alyssa Duncan is a 77 y.o. 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  HPI:  The patient was last seen in the medical oncology clinic on 12/18/2016.  At that time, she felt "great".  Exam revealed post-operative and post-radiation changes in the left breast. Labs were unremarkable.  She continued Aromasin.    Patient underwent a bilateral diagnostic mammogram and LEFT breast biopsy on 03/13/2017 that revealed a suspicious 6 mm mass in the superficial upper outer left breast.  Area was biopsied. Biopsy results revealed fat necrosis with dystrophic calcification.   She presented with RUQ pain.  RUQ ultrasound on 05/12/2017 revealed multiple mobile gallstones measuring up to 1.1 cm without ultrasound findings to suggest acute cholecystitis.  There was fatty liver with focal fatty sparing adjacent to the gallbladder fossa.  MRI abdomen with MRCP on 05/13/2017 revealed dilated gallbladder with multiple gallstones but negative for pericholecystic fluid or gallbladder wall thickening.  There was borderline to slightly enlarged common bile duct with 6 mm stone visualized in the mid to distal common bile duct.  There was hepatic steatosis  She underwent laparoscopic cholecystectomy on 05/15/2017 by Dr. Tama High.  During the interim, she has done well. She denies acute concerns today. Patient verbalizes no issues with regards to her breasts. She denies B symptoms and interval infections.  She is eating well. Her weight is up 6 pounds. Patient denies pain in the clinic today.    Past Medical History:  Diagnosis Date  . Acute biliary pancreatitis   . Arthritis   . Asthma   . Back pain   . Breast cancer ([place]) 2013   left breast ductal and lobular carcinoma  . Choledocholithiasis   . Difficult intubation   . Dysrhythmia 12/2015   A-fib  . Family history of adverse reaction to  anesthesia    dad had hard time waking up from anesthesia  . Gallstone pancreatitis 05/13/2017  . GERD (gastroesophageal reflux disease)   . Hyperlipidemia   . Hypertension   . Hypothyroidism   . Kidney stones 12/2015  . Personal history of radiation therapy 2013   BREAST CA  . Shingles   . Skin cancer 2005   left arm and nose  . Sleep apnea     Past Surgical History:  Procedure Laterality Date  . BREAST BIOPSY Left 2013   POS  . BREAST LUMPECTOMY Left 2013   with rad tx  . CARPAL TUNNEL RELEASE    . CATARACT EXTRACTION    . CHOLECYSTECTOMY N/A 05/15/2017   Procedure: LAPAROSCOPIC CHOLECYSTECTOMY WITH INTRAOPERATIVE CHOLANGIOGRAM;  Surgeon: Vickie Epley, MD;  Location: [facility] ORS;  Service: General;  Laterality: N/A;  . COLONOSCOPY WITH PROPOFOL N/A 05/10/2015   Procedure: COLONOSCOPY WITH PROPOFOL;  Surgeon: Hulen Luster, MD;  Location: [facility] ENDOSCOPY;  Service: Gastroenterology;  Laterality: N/A;  . CYSTOSCOPY W/ RETROGRADES Bilateral 01/02/2016   Procedure: CYSTOSCOPY WITH RETROGRADE PYELOGRAM;  Surgeon: Hollice Espy, MD;  Location: [facility] ORS;  Service: Urology;  Laterality: Bilateral;  . CYSTOSCOPY W/ URETERAL STENT PLACEMENT Right 01/02/2016   Procedure: CYSTOSCOPY WITH STENT REPLACEMENT;  Surgeon: Hollice Espy, MD;  Location: [facility] ORS;  Service: Urology;  Laterality: Right;  . CYSTOSCOPY WITH STENT PLACEMENT Right 12/04/2015   Procedure: CYSTOSCOPY WITH STENT PLACEMENT;  Surgeon: Hollice Espy, MD;  Location: [facility] ORS;  Service: Urology;  Laterality: Right;  . ENDOSCOPIC RETROGRADE  CHOLANGIOPANCREATOGRAPHY (ERCP) WITH PROPOFOL N/A 05/14/2017   Procedure: ENDOSCOPIC RETROGRADE CHOLANGIOPANCREATOGRAPHY (ERCP) WITH PROPOFOL;  Surgeon: Lucilla Lame, MD;  Location: [facility] ENDOSCOPY;  Service: Endoscopy;  Laterality: N/A;  . JOINT REPLACEMENT Left 2013   hip  . KNEE ARTHROSCOPY    . SEPTOPLASTY    . TRANSURETHRAL RESECTION OF BLADDER TUMOR WITH MITOMYCIN-C N/A 01/02/2016   Procedure:  TRANSURETHRAL RESECTION OF BLADDER TUMOR WITH MITOMYCIN-C;  Surgeon: Hollice Espy, MD;  Location: [facility] ORS;  Service: Urology;  Laterality: N/A;  . URETEROSCOPY WITH HOLMIUM LASER LITHOTRIPSY Right 01/02/2016   Procedure: URETEROSCOPY WITH HOLMIUM LASER LITHOTRIPSY;  Surgeon: Hollice Espy, MD;  Location: [facility] ORS;  Service: Urology;  Laterality: Right;  . UVULOPALATOPHARYNGOPLASTY     and tongue surgery    Family History  Problem Relation Age of Onset  . Breast cancer Mother 41  . Kidney cancer Father   . Kidney Stones Brother   . Prostate cancer Neg Hx     Social History:  reports that  has never smoked. she has never used smokeless tobacco. She reports that she does not drink alcohol or use drugs.  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  She lives in [place].  The patient is alone today.  Allergies:  Allergies  Allergen Reactions  . Ativan [Lorazepam]     Hysteria   . Cardura [Doxazosin] Other (See Comments)    unsure  . Clonidine Derivatives Other (See Comments)    unsure  . Enalapril Other (See Comments)    unsure  . Levaquin [Levofloxacin] Other (See Comments)    Couldn't raise her arms  . Mobic [Meloxicam] Other (See Comments)    unsure  . Sulfa Antibiotics Nausea Only  . Vicodin [Hydrocodone-Acetaminophen] Other (See Comments)    unsure    Current Medications: Current Outpatient Medications  Medication Sig Dispense Refill  . amLODipine (NORVASC) 5 MG tablet Take 5 mg by mouth every morning.     Marland Kitchen anastrozole (ARIMIDEX) 1 MG tablet TAKE 1 TABLET DAILY 30 tablet 1  . aspirin EC 81 MG tablet Take 1 tablet (81 mg total) by mouth daily. 90 tablet 3  . Calcium Carb-Cholecalciferol (CALCIUM 600+D3) 600-800 MG-UNIT TABS Take 1 tablet by mouth 2 (two) times daily.    . DORZOLAMIDE HCL-TIMOLOL MAL OP Place 1 drop into both eyes 2 (two) times daily.    . hydrochlorothiazide (HYDRODIURIL) 25 MG tablet Take 25 mg by mouth daily.    Marland Kitchen ibuprofen (ADVIL,MOTRIN) 200 MG tablet Take 400  mg by mouth every 6 (six) hours as needed.     Marland Kitchen levothyroxine (SYNTHROID, LEVOTHROID) 75 MCG tablet Take 75 mcg by mouth daily before breakfast.    . loratadine (CLARITIN) 10 MG tablet Take 10 mg by mouth daily.    Marland Kitchen losartan (COZAAR) 100 MG tablet Take 100 mg by mouth every morning.     . lovastatin (MEVACOR) 40 MG tablet Take 40 mg by mouth at bedtime.    Marland Kitchen LUMIGAN 0.01 % SOLN Place 1 drop into both eyes at bedtime.     . metoprolol tartrate (LOPRESSOR) 100 MG tablet Take 100 mg by mouth 2 (two) times daily.    . pantoprazole (PROTONIX) 40 MG tablet Take 40 mg by mouth daily.    . potassium chloride SA (K-DUR,KLOR-CON) 20 MEQ tablet Take 20 mEq by mouth 2 (two) times daily.    . vitamin B-12 (CYANOCOBALAMIN) 1000 MCG tablet Take 1,000 mcg by mouth daily.    . vitamin C (ASCORBIC ACID) 500  MG tablet Take 500 mg by mouth daily.     No current facility-administered medications for this visit.     Review of Systems:  GENERAL:  Doing well. Feels "back to normal".  No fevers or sweats. Weight up 6 pounds.  PERFORMANCE STATUS (ECOG):  0 HEENT:  No visual changes, runny nose, sore throat, mouth sores or tenderness. Lungs: No shortness of breath or cough.  No hemoptysis. Cardiac:  No chest pain, palpitations, orthopnea, or PND. GI:  No nausea, vomiting, diarrhea, constipation, melena or hematochezia. Up-to-date on colonoscopy. GU:  No urgency, frequency, dysuria, or hematuria. Musculoskeletal:  No back pain.  Knee aches.  No muscle tenderness. Extremities:  No pain or swelling. Skin:  Laparoscopic scarring from gallbladder surgery. No rashes or skin changes. Neuro:  No headache, numbness or weakness, balance or coordination issues. Endocrine:  No diabetes, thyroid issues, hot flashes or night sweats. Psych:  No mood changes, depression or anxiety. Pain:  No focal pain. Review of systems:  All other systems reviewed and found to be negative.  Physical Exam: Blood pressure (!) 153/85, pulse  66, temperature 98 F (36.7 C), temperature source Tympanic, resp. rate 18, weight 207 lb 6 oz (94.1 kg). GENERAL:  Well developed, well nourished, woman sitting comfortably in the exam room in no acute distress. MENTAL STATUS:  Alert and oriented to person, place and time. HEAD:  Brown hair.  Normocephalic, atraumatic, face symmetric, no Cushingoid features. EYES:  Gold rimmed glasses.  Blue eyes.  Pupils equal round and reactive to light and accomodation.  No conjunctivitis or scleral icterus. ENT:  Oropharynx clear without lesion.  Tongue normal. Mucous membranes moist.  RESPIRATORY:  Clear to auscultation without rales, wheezes or rhonchi. CARDIOVASCULAR:  Regular rate and rhythm without murmur, rub or gallop. BREAST:  Right breast with minimal fibrocystic changes.  No masses, skin changes or nipple discharge.  Left breast with post-operative changes.  Fingertip dimpling at 2:30-3 o'clock position (stable), recent biopsy site.  Inferior post-op and post-radiation edema.  No masses, skin changes or nipple discharge.  ABDOMEN:  Soft, non-tender, with active bowel sounds, and no hepatosplenomegaly.  No masses. SKIN:  No rashes, ulcers or lesions. EXTREMITIES: No edema, no skin discoloration or tenderness.  No palpable cords. LYMPH NODES: No palpable cervical, supraclavicular, axillary or inguinal adenopathy  NEUROLOGICAL: Unremarkable. PSYCH:  Appropriate.   Appointment on 06/16/2017  Component Date Value Ref Range Status  . WBC 06/16/2017 8.1  3.6 - 11.0 K/uL Final  . RBC 06/16/2017 4.40  3.80 - 5.20 MIL/uL Final  . Hemoglobin 06/16/2017 14.1  12.0 - 16.0 g/dL Final  . HCT 06/16/2017 40.9  35.0 - 47.0 % Final  . MCV 06/16/2017 93.0  80.0 - 100.0 fL Final  . MCH 06/16/2017 32.0  26.0 - 34.0 pg Final  . MCHC 06/16/2017 34.4  32.0 - 36.0 g/dL Final  . RDW 06/16/2017 14.1  11.5 - 14.5 % Final  . Platelets 06/16/2017 254  150 - 440 K/uL Final  . Neutrophils Relative % 06/16/2017 69  % Final   . Neutro Abs 06/16/2017 5.5  1.4 - 6.5 K/uL Final  . Lymphocytes Relative 06/16/2017 18  % Final  . Lymphs Abs 06/16/2017 1.5  1.0 - 3.6 K/uL Final  . Monocytes Relative 06/16/2017 6  % Final  . Monocytes Absolute 06/16/2017 0.5  0.2 - 0.9 K/uL Final  . Eosinophils Relative 06/16/2017 7  % Final  . Eosinophils Absolute 06/16/2017 0.6  0 - 0.7 K/uL  Final  . Basophils Relative 06/16/2017 0  % Final  . Basophils Absolute 06/16/2017 0.0  0 - 0.1 K/uL Final   Performed at [facility], [address] [place], [place] [address]    Assessment:  Alyssa Duncan is a 77 y.o. 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  Pathology revealed a 3.6 cm area of grade II ductal carcinoma in situ. Margins were clear but close at 1 mm.  DCIS was ER + (> 90%), PR + (> 90%).  Pathologic stage was TisNx.  She received 5000 cGy to her left breast and boost scar another 1600 cGy using electron beam based on the close margin.  She completed radiation in 06/2012.    She was initially treated with Femara, but discontinued secondary to joint pains.  She tried tamoxifen, but discontinued secondary to swelling.  She has been on Aromasin since 11/16/2012.  She is tolerating it well.  Bilateral diagnostic mammogram on 03/24/2016 revealed no evidence of malignancy.  Diagnostic left mammogram and ultrasound on 06/09/2016 revealed an irregular hypoechogenicity in the left breast at 6 o'clock 2 cm from the nipple measuring 9 x 7 x 10 mm. This was felt likely to represent post treatment change but underlying malignancy could not be excluded.  Ultrasound of the left axilla revealed no enlarged adenopathy.   Ultrasound guided biopsy on 06/19/2016 revealed changes c/w organizing fat necrosis.  Bilateral diagnostic mammogram on 03/13/2017 that revealed a suspicious 6 mm mass in the superficial upper outer left breast.  Biopsy on 03/24/2017 results revealed fat necrosis with dystrophic calcification.    Bone density on 08/22/2015 was normal with a T-score of -1.0 in the left femur.    She has a history of non-muscle invasive bladder cancer.  She underwent TURBT on 01/02/2016.  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  Cystoscopy on 05/01/2016 revealed a very small low-grade appearing recurrence.  She underwent cystoscopy and fulguration on 05/06/2016.  Cystoscopy on 04/29/2017 revealed no evidence of disease.  Next cystoscopy planned for 07/28/2017.  Symptomatically, she feels "good".  Exam reveals post-operative and post-radiation changes in the left breast.  CBC and CMP are unremarkable.   Plan: 1.  Labs today:  CBC with diff, CMP. 2.  Discuss  bilateral mammogram - suspicious 6 mm mass in the superficial upper outer left breast.  Pathology showed fat necrosis with dystrophic calcification.  3.  Schedule bone density study 08/21/2017. 4.  Discuss planned stop date for Aromasin. Anticipate discontinuation in August.  5.  Anticipate yearly follow-up after next visit. 6.  RTC in 6 months for MD assessment and labs (CBC with diff, CMP).   Honor Loh, NP  06/16/2017, 11:34 AM   I saw and evaluated the patient, participating in the key portions of the service and reviewing pertinent diagnostic studies and records.  I reviewed the nurse practitioner's note and agree with the findings and the plan.  The assessment and plan were discussed with the patient.  A few questions were asked by the patient and answered.   Lequita Asal, MD 06/16/2017, 11:34 AM

## 2017-06-18 ENCOUNTER — Ambulatory Visit: Payer: Medicare Other | Admitting: Hematology and Oncology

## 2017-06-18 ENCOUNTER — Other Ambulatory Visit: Payer: Medicare Other

## 2017-07-24 ENCOUNTER — Encounter: Payer: Self-pay | Admitting: Emergency Medicine

## 2017-07-24 ENCOUNTER — Emergency Department
Admission: EM | Admit: 2017-07-24 | Discharge: 2017-07-24 | Disposition: A | Discharge status: Home / Self Care | Payer: Medicare Other | Attending: Emergency Medicine | Admitting: Emergency Medicine

## 2017-07-24 DIAGNOSIS — Z7982 Long term (current) use of aspirin: Secondary | ICD-10-CM | POA: Diagnosis not present

## 2017-07-24 DIAGNOSIS — I10 Essential (primary) hypertension: Secondary | ICD-10-CM | POA: Diagnosis not present

## 2017-07-24 DIAGNOSIS — Z96642 Presence of left artificial hip joint: Secondary | ICD-10-CM | POA: Insufficient documentation

## 2017-07-24 DIAGNOSIS — K644 Residual hemorrhoidal skin tags: Secondary | ICD-10-CM | POA: Insufficient documentation

## 2017-07-24 DIAGNOSIS — E039 Hypothyroidism, unspecified: Secondary | ICD-10-CM | POA: Insufficient documentation

## 2017-07-24 DIAGNOSIS — K921 Melena: Secondary | ICD-10-CM

## 2017-07-24 DIAGNOSIS — K625 Hemorrhage of anus and rectum: Secondary | ICD-10-CM | POA: Diagnosis present

## 2017-07-24 DIAGNOSIS — Z79899 Other long term (current) drug therapy: Secondary | ICD-10-CM | POA: Diagnosis not present

## 2017-07-24 LAB — CBC
HCT: 43.9 % (ref 35.0–47.0)
HEMOGLOBIN: 15.1 g/dL (ref 12.0–16.0)
MCH: 31.4 pg (ref 26.0–34.0)
MCHC: 34.3 g/dL (ref 32.0–36.0)
MCV: 91.4 fL (ref 80.0–100.0)
Platelets: 307 10*3/uL (ref 150–440)
RBC: 4.8 MIL/uL (ref 3.80–5.20)
RDW: 14 % (ref 11.5–14.5)
WBC: 8.9 10*3/uL (ref 3.6–11.0)

## 2017-07-24 LAB — TYPE AND SCREEN
ABO/RH(D): B POS
Antibody Screen: NEGATIVE

## 2017-07-24 LAB — COMPREHENSIVE METABOLIC PANEL
ALT: 41 U/L (ref 14–54)
ANION GAP: 9 (ref 5–15)
AST: 40 U/L (ref 15–41)
Albumin: 3.9 g/dL (ref 3.5–5.0)
Alkaline Phosphatase: 93 U/L (ref 38–126)
BUN: 16 mg/dL (ref 6–20)
CALCIUM: 8.9 mg/dL (ref 8.9–10.3)
CHLORIDE: 103 mmol/L (ref 101–111)
CO2: 29 mmol/L (ref 22–32)
Creatinine, Ser: 0.88 mg/dL (ref 0.44–1.00)
GFR calc non Af Amer: >60 mL/min (ref >60)
Glucose, Bld: 128 mg/dL — ABNORMAL HIGH (ref 65–99)
Potassium: 3.2 mmol/L — ABNORMAL LOW (ref 3.5–5.1)
SODIUM: 141 mmol/L (ref 135–145)
Total Bilirubin: 0.3 mg/dL (ref 0.3–1.2)
Total Protein: 7.3 g/dL (ref 6.5–8.1)

## 2017-07-24 NOTE — Discharge Instructions (Signed)
Fortunately today your blood work was reassuring.  Please make an appointment to follow-up with the gastroenterologist for reevaluation and return to the emergency department sooner for any concerns whatsoever.  It was a pleasure to take care of you today, and thank you for coming to our emergency department.  If you have any questions or concerns before leaving please ask the nurse to grab me and I'm more than happy to go through your aftercare instructions again.  If you were prescribed any opioid pain medication today such as Norco, Vicodin, Percocet, morphine, hydrocodone, or oxycodone please make sure you do not drive when you are taking this medication as it can alter your ability to drive safely.  If you have any concerns once you are home that you are not improving or are in fact getting worse before you can make it to your follow-up appointment, please do not hesitate to call 911 and come back for further evaluation.  Darel Hong, MD  Results for orders placed or performed during the hospital encounter of 07/24/17  Comprehensive metabolic panel  Result Value Ref Range   Sodium 141 135 - 145 mmol/L   Potassium 3.2 (L) 3.5 - 5.1 mmol/L   Chloride 103 101 - 111 mmol/L   CO2 29 22 - 32 mmol/L   Glucose, Bld 128 (H) 65 - 99 mg/dL   BUN 16 6 - 20 mg/dL   Creatinine, Ser 0.88 0.44 - 1.00 mg/dL   Calcium 8.9 8.9 - 10.3 mg/dL   Total Protein 7.3 6.5 - 8.1 g/dL   Albumin 3.9 3.5 - 5.0 g/dL   AST 40 15 - 41 U/L   ALT 41 14 - 54 U/L   Alkaline Phosphatase 93 38 - 126 U/L   Total Bilirubin 0.3 0.3 - 1.2 mg/dL   GFR calc non Af Amer >60 >60 mL/min   GFR calc Af Amer >60 >60 mL/min   Anion gap 9 5 - 15  CBC  Result Value Ref Range   WBC 8.9 3.6 - 11.0 K/uL   RBC 4.80 3.80 - 5.20 MIL/uL   Hemoglobin 15.1 12.0 - 16.0 g/dL   HCT 43.9 35.0 - 47.0 %   MCV 91.4 80.0 - 100.0 fL   MCH 31.4 26.0 - 34.0 pg   MCHC 34.3 32.0 - 36.0 g/dL   RDW 14.0 11.5 - 14.5 %   Platelets 307 150 - 440 K/uL   Type and screen Vander  Result Value Ref Range   ABO/RH(D) B POS    Antibody Screen NEG    Sample Expiration      07/27/2017 Performed at [facility], [address] [place], [place] [address]

## 2017-07-24 NOTE — ED Notes (Signed)
Pt c/o bright red rectal bleeding at this time, started today. Pt in no obvious distress at this time.

## 2017-07-24 NOTE — ED Notes (Signed)
NAD noted at time of D/C. Pt denies questions or concerns. Pt ambulatory to the lobby at this time.  

## 2017-07-24 NOTE — ED Triage Notes (Signed)
Pt reports she was using restroom this evening and noticed bright red blood coming from rectum. Pt denies having constipation nor diarrhea. Pt denies recent sickness.

## 2017-07-24 NOTE — ED Provider Notes (Signed)
[facility] Emergency Department Provider Note  ____________________________________________   First MD Initiated Contact with Patient 07/24/17 2048     (approximate)  I have reviewed the triage vital signs and the nursing notes.   HISTORY  Chief Complaint Rectal Bleeding   HPI Alyssa Duncan is a 77 y.o. female who self presents to the emergency department after noting an episode of bright red blood per rectum today.  She denies constipation.  She denies diarrhea.  She denies dyschezia.  Her last colonoscopy was 2 years ago and she said that she had multiple polyps.  She has had no abdominal pain.  No fevers or chills.  No trauma.  Her symptoms were sudden onset resolved quickly on their own.  She is no longer bleeding.  Nothing particular seems to make her symptoms come on or go away.  Past Medical History:  Diagnosis Date  . Acute biliary pancreatitis   . Arthritis   . Asthma   . Back pain   . Breast cancer ([place]) 2013   left breast ductal and lobular carcinoma  . Choledocholithiasis   . Difficult intubation   . Dysrhythmia 12/2015   A-fib  . Family history of adverse reaction to anesthesia    dad had hard time waking up from anesthesia  . Gallstone pancreatitis 05/13/2017  . GERD (gastroesophageal reflux disease)   . Hyperlipidemia   . Hypertension   . Hypothyroidism   . Kidney stones 12/2015  . Personal history of radiation therapy 2013   BREAST CA  . Shingles   . Skin cancer 2005   left arm and nose  . Sleep apnea     Patient Active Problem List   Diagnosis Date Noted  . History of acute pancreatitis 05/13/2017  . Calculus of gallbladder and bile duct without cholecystitis, with obstruction 05/12/2017  . 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 Malignant neoplasm of left breast in female, estrogen receptor positive ([place]) 08/12/2016  . Morbid obesity ([place]) 08/12/2016  . History of bladder cancer 02/13/2016  . UTI  (urinary tract infection) 12/07/2015  . Sepsis ([place]) 12/06/2015  . Bacteremia 12/06/2015  . Acute respiratory distress 12/06/2015  . New onset atrial fibrillation ([place]) 12/06/2015  . Acute delirium   . AKI (acute kidney injury) ([place])   . Hypokalemia   . Ureteral stone 12/03/2015  . Nephrolithiasis 12/03/2015  . Allergic rhinitis 06/13/2015  . Arthritis 06/13/2015  . Asthma without status asthmaticus 06/13/2015  . Back ache 06/13/2015  . Malignant neoplastic disease ([place]) 06/13/2015  . Edema leg 06/13/2015  . Benign essential HTN 06/13/2015  . Acid reflux 06/13/2015  . Blood glucose elevated 06/13/2015  . HLD (hyperlipidemia) 06/13/2015  . Hypersomnia with sleep apnea 06/13/2015  . Adult hypothyroidism 06/13/2015  . Encounter for surgical follow-up care 05/17/2015  . History of repair of hip joint 05/17/2015    Past Surgical History:  Procedure Laterality Date  . BREAST BIOPSY Left 2013   POS  . BREAST LUMPECTOMY Left 2013   with rad tx  . CARPAL TUNNEL RELEASE    . CATARACT EXTRACTION    . CHOLECYSTECTOMY N/A 05/15/2017   Procedure: LAPAROSCOPIC CHOLECYSTECTOMY WITH INTRAOPERATIVE CHOLANGIOGRAM;  Surgeon: Vickie Epley, MD;  Location: [facility] ORS;  Service: General;  Laterality: N/A;  . COLONOSCOPY WITH PROPOFOL N/A 05/10/2015   Procedure: COLONOSCOPY WITH PROPOFOL;  Surgeon: Hulen Luster, MD;  Location: [facility] ENDOSCOPY;  Service: Gastroenterology;  Laterality: N/A;  . CYSTOSCOPY  W/ RETROGRADES Bilateral 01/02/2016   Procedure: CYSTOSCOPY WITH RETROGRADE PYELOGRAM;  Surgeon: Hollice Espy, MD;  Location: [facility] ORS;  Service: Urology;  Laterality: Bilateral;  . CYSTOSCOPY W/ URETERAL STENT PLACEMENT Right 01/02/2016   Procedure: CYSTOSCOPY WITH STENT REPLACEMENT;  Surgeon: Hollice Espy, MD;  Location: [facility] ORS;  Service: Urology;  Laterality: Right;  . CYSTOSCOPY WITH STENT PLACEMENT Right 12/04/2015   Procedure: CYSTOSCOPY WITH STENT PLACEMENT;  Surgeon: Hollice Espy, MD;   Location: [facility] ORS;  Service: Urology;  Laterality: Right;  . ENDOSCOPIC RETROGRADE CHOLANGIOPANCREATOGRAPHY (ERCP) WITH PROPOFOL N/A 05/14/2017   Procedure: ENDOSCOPIC RETROGRADE CHOLANGIOPANCREATOGRAPHY (ERCP) WITH PROPOFOL;  Surgeon: Lucilla Lame, MD;  Location: [facility] ENDOSCOPY;  Service: Endoscopy;  Laterality: N/A;  . JOINT REPLACEMENT Left 2013   hip  . KNEE ARTHROSCOPY    . SEPTOPLASTY    . TRANSURETHRAL RESECTION OF BLADDER TUMOR WITH MITOMYCIN-C N/A 01/02/2016   Procedure: TRANSURETHRAL RESECTION OF BLADDER TUMOR WITH MITOMYCIN-C;  Surgeon: Hollice Espy, MD;  Location: [facility] ORS;  Service: Urology;  Laterality: N/A;  . URETEROSCOPY WITH HOLMIUM LASER LITHOTRIPSY Right 01/02/2016   Procedure: URETEROSCOPY WITH HOLMIUM LASER LITHOTRIPSY;  Surgeon: Hollice Espy, MD;  Location: [facility] ORS;  Service: Urology;  Laterality: Right;  . UVULOPALATOPHARYNGOPLASTY     and tongue surgery    Prior to Admission medications   Medication Sig Start Date End Date Taking? Authorizing Provider  amLODipine (NORVASC) 5 MG tablet Take 5 mg by mouth every morning.     [provider]  anastrozole (ARIMIDEX) 1 MG tablet TAKE 1 TABLET DAILY 05/13/17   Karen Kitchens, NP  aspirin EC 81 MG tablet Take 1 tablet (81 mg total) by mouth daily. 02/15/16   Wellington Hampshire, MD  Calcium Carb-Cholecalciferol (CALCIUM 600+D3) 600-800 MG-UNIT TABS Take 1 tablet by mouth 2 (two) times daily.    [provider]  DORZOLAMIDE HCL-TIMOLOL MAL OP Place 1 drop into both eyes 2 (two) times daily.    [provider]  hydrochlorothiazide (HYDRODIURIL) 25 MG tablet Take 25 mg by mouth daily.    [provider]  ibuprofen (ADVIL,MOTRIN) 200 MG tablet Take 400 mg by mouth every 6 (six) hours as needed.     [provider]  levothyroxine (SYNTHROID, LEVOTHROID) 75 MCG tablet Take 75 mcg by mouth daily before breakfast.    [provider]  loratadine (CLARITIN) 10 MG tablet Take 10 mg by  mouth daily.    [provider]  losartan (COZAAR) 100 MG tablet Take 100 mg by mouth every morning.     [provider]  lovastatin (MEVACOR) 40 MG tablet Take 40 mg by mouth at bedtime.    [provider]  LUMIGAN 0.01 % SOLN Place 1 drop into both eyes at bedtime.  06/06/15   [provider]  metoprolol tartrate (LOPRESSOR) 100 MG tablet Take 100 mg by mouth 2 (two) times daily.    [provider]  pantoprazole (PROTONIX) 40 MG tablet Take 40 mg by mouth daily.    [provider]  potassium chloride SA (K-DUR,KLOR-CON) 20 MEQ tablet Take 20 mEq by mouth 2 (two) times daily.    [provider]  vitamin B-12 (CYANOCOBALAMIN) 1000 MCG tablet Take 1,000 mcg by mouth daily.    [provider]  vitamin C (ASCORBIC ACID) 500 MG tablet Take 500 mg by mouth daily.    [provider]    Allergies Ativan [lorazepam]; Cardura [doxazosin]; Clonidine derivatives; Enalapril; Levaquin [levofloxacin]; Mobic [meloxicam]; Sulfa  antibiotics; and Vicodin [hydrocodone-acetaminophen]  Family History  Problem Relation Age of Onset  . Breast cancer Mother 61  . Kidney cancer Father   . Kidney Stones Brother   . Prostate cancer Neg Hx     Social History Social History   Tobacco Use  . Smoking status: Never Smoker  . Smokeless tobacco: Never Used  Substance Use Topics  . Alcohol use: No  . Drug use: No    Review of Systems Constitutional: No fever/chills Eyes: No visual changes. ENT: No sore throat. Cardiovascular: Denies chest pain. Respiratory: Denies shortness of breath. Gastrointestinal: No abdominal pain.  No nausea, no vomiting.  No diarrhea.  No constipation. Genitourinary: Negative for dysuria. Musculoskeletal: Negative for back pain. Skin: Negative for rash. Neurological: Negative for headaches, focal weakness or numbness.   ____________________________________________   PHYSICAL EXAM:  VITAL SIGNS: ED  Triage Vitals [07/24/17 2000]  Enc Vitals Group     BP (!) 173/88     Pulse Rate 73     Resp 16     Temp 98.6 F (37 C)     Temp Source Oral     SpO2 96 %     Weight 207 lb (93.9 kg)     Height      Head Circumference      Peak Flow      Pain Score      Pain Loc      Pain Edu?      Excl. in [place]?     Constitutional: Alert and oriented x4 joking laughing well-appearing nontoxic no diaphoresis speaks focally sentences Eyes: PERRL EOMI. Head: Atraumatic. Nose: No congestion/rhinnorhea. Mouth/Throat: No trismus Neck: No stridor.   Cardiovascular: Normal rate, regular rhythm. Grossly normal heart sounds.  Good peripheral circulation. Respiratory: Normal respiratory effort.  No retractions. Lungs CTAB and moving good air Gastrointestinal: Soft nontender No anal fissure noted she does have nonbleeding external hemorrhoid Musculoskeletal: No lower extremity edema   Neurologic:  Normal speech and language. No gross focal neurologic deficits are appreciated. Skin:  Skin is warm, dry and intact. No rash noted. Psychiatric: Mood and affect are normal. Speech and behavior are normal.    ____________________________________________   DIFFERENTIAL includes but not limited to  External hemorrhoid, internal hemorrhoid, diverticulitis, rectal cancer, colon cancer ____________________________________________   LABS (all labs ordered are listed, but only abnormal results are displayed)  Labs Reviewed  COMPREHENSIVE METABOLIC PANEL - Abnormal; Notable for the following components:      Result Value   Potassium 3.2 (*)    Glucose, Bld 128 (*)    All other components within normal limits  CBC  TYPE AND SCREEN    Lab work reviewed by me with no indication for acute transfusion __________________________________________  EKG   ____________________________________________  RADIOLOGY   ____________________________________________   PROCEDURES  Procedure(s) performed:  no  Procedures  Critical Care performed: no  Observation: no ____________________________________________   INITIAL IMPRESSION / ASSESSMENT AND PLAN / ED COURSE  Pertinent labs & imaging results that were available during my care of the patient were reviewed by me and considered in my medical decision making (see chart for details).  The patient arrives hemodynamically stable and well-appearing.  Her abdominal exam is benign.  Her hemoglobin is normal.  She does have an external hemorrhoid.  I had a lengthy discussion with the patient regarding the possibility of internal hemorrhoids versus colon/rectal cancer.  I have encouraged her to increase the fiber in her diet and to  follow up with gastroenterology for reevaluation.  At this point she is not actively bleeding and has no pain and there is no indication for further emergency department work-up.  Strict return precautions and given to the patient and her husband verbalized understanding and agreement with the plan.      ____________________________________________   FINAL CLINICAL IMPRESSION(S) / ED DIAGNOSES  Final diagnoses:  External hemorrhoid  Hematochezia      NEW MEDICATIONS STARTED DURING THIS VISIT:  Discharge Medication List as of 07/24/2017  9:14 PM       Note:  This document was prepared using Dragon voice recognition software and may include unintentional dictation errors.     Darel Hong, MD 07/25/17 2243

## 2017-07-29 ENCOUNTER — Encounter: Payer: Self-pay | Admitting: Urology

## 2017-07-29 ENCOUNTER — Ambulatory Visit: Payer: Medicare Other | Admitting: Urology

## 2017-07-29 VITALS — BP 147/76 | HR 68 | Ht 62.0 in | Wt 200.0 lb

## 2017-07-29 DIAGNOSIS — C679 Malignant neoplasm of bladder, unspecified: Secondary | ICD-10-CM | POA: Diagnosis not present

## 2017-07-29 DIAGNOSIS — R109 Unspecified abdominal pain: Secondary | ICD-10-CM

## 2017-07-29 LAB — URINALYSIS, COMPLETE
BILIRUBIN UA: NEGATIVE
Glucose, UA: NEGATIVE
Ketones, UA: NEGATIVE
NITRITE UA: NEGATIVE
PH UA: 5.5 (ref 5.0–7.5)
PROTEIN UA: NEGATIVE
RBC UA: NEGATIVE
Specific Gravity, UA: 1.025 (ref 1.005–1.030)
UUROB: 0.2 mg/dL (ref 0.2–1.0)

## 2017-07-29 LAB — MICROSCOPIC EXAMINATION
BACTERIA UA: NONE SEEN
Epithelial Cells (non renal): NONE SEEN /hpf (ref 0–10)
RBC MICROSCOPIC, UA: NONE SEEN /HPF (ref 0–2)

## 2017-07-29 MED ORDER — LIDOCAINE HCL URETHRAL/MUCOSAL 2 % EX GEL
1.0000 "application " | Freq: Once | CUTANEOUS | Status: AC
  Administered 2017-07-29: 1 via URETHRAL

## 2017-07-29 MED ORDER — CIPROFLOXACIN HCL 500 MG PO TABS
500.0000 mg | ORAL_TABLET | Freq: Once | ORAL | Status: AC
  Administered 2017-07-29: 500 mg via ORAL

## 2017-07-29 NOTE — Progress Notes (Signed)
   07/29/17  CC:  Chief Complaint  Patient presents with  . Cysto    HPI: 77 year old female admitted on 12/03/2015 with fevers, right flank pain found to have an obstructing right distal 5 mm ureteral stone. She was taken to the operating room for cystoscopy, right ureteral stent placement in the setting of worsening leukocytosis and uncontrolled pain.  Intraoperatively, the bladder tumor was identified.    She returned to the operating room on 01/02/2016 for definitive management of her stone via ureteroscopy, laser lithotripsy followed by TURBT. 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  Cysto in office on 05/01/2016 showed a very small low-grade appearing recurrence ~2 mm, anterior bladder wall in close proximity to the bladder neck. She underwent cystoscopy, fulguration of this lesion in the office shortly thereafter which was well tolerated.    She returns today for routine follow up.   She reports a day that several weeks ago, she was pulling weeds in her yard and gardening.  Several days later, she developed right flank pain under her rib cage close to her bra line.  This is been persistent since that time and is worse with movement.  She does note that this feels somewhat similar to when she had a kidney stone on the right side.  Height 5\' 2"  (1.575 m), weight 203 lb (92.1 kg). NED. A&Ox3.   No respiratory distress   Abd soft, NT, ND Normal external genitalia with patent urethral meatus  Cystoscopy/ Bladder lesion fulgeration procedure Note  Patient identification was confirmed, informed consent was obtained, and patient was prepped using Betadine solution.  Lidocaine jelly was instilled into the bladder and allowed to dwell for 30 minutes.  Preoperative abx where received prior to procedure.    Procedure: - Flexible cystoscope introduced, without any difficulty.   - Thorough  search of the bladder revealed:    normal urethral meatus    normal urothelium without tumors or lesions other than a stellate scar    no stones    no ulcers     no urethral polyps    no trabeculation  - Ureteral orifices were normal in position and appearance.  Retroflexion showed no evidence of bladder tumor and bladder neck  Post-Procedure: - Patient tolerated the procedure well  Assessment/ Plan:  1. 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 - lidocaine (XYLOCAINE) 2 % jelly 1 application; Place 1 application into the urethra once.  2. History of kidney stones/ right flank pain Currently asymptomatic.  RUS negative 02/08/16 negative for residual hydro or stones Given her right flank pain, will order renal ultrasound to rule out stones as underlying cause We will call with results  Return in about 3 months (around 10/28/2017) for cysto/ will call with RUS.   Hollice Espy, MD

## 2017-07-31 ENCOUNTER — Ambulatory Visit
Admission: RE | Admit: 2017-07-31 | Discharge: 2017-07-31 | Disposition: A | Discharge status: Home / Self Care | Payer: Medicare Other | Source: Ambulatory Visit | Attending: Urology | Admitting: Urology

## 2017-07-31 ENCOUNTER — Telehealth: Payer: Self-pay | Admitting: Family Medicine

## 2017-07-31 ENCOUNTER — Encounter: Payer: Self-pay | Admitting: Family Medicine

## 2017-07-31 DIAGNOSIS — Z8551 Personal history of malignant neoplasm of bladder: Secondary | ICD-10-CM | POA: Diagnosis not present

## 2017-07-31 DIAGNOSIS — R109 Unspecified abdominal pain: Secondary | ICD-10-CM | POA: Insufficient documentation

## 2017-07-31 DIAGNOSIS — Z87442 Personal history of urinary calculi: Secondary | ICD-10-CM | POA: Insufficient documentation

## 2017-07-31 NOTE — Telephone Encounter (Signed)
-----   Message from Hollice Espy, MD sent at 07/31/2017  2:55 PM EDT ----- Please let this patient know that her renal ultrasound looks fine.  No evidence of obstructing stones.  Hollice Espy, MD

## 2017-07-31 NOTE — Telephone Encounter (Signed)
Letter sent.

## 2017-08-03 ENCOUNTER — Ambulatory Visit: Payer: Medicare Other | Admitting: Gastroenterology

## 2017-08-03 ENCOUNTER — Encounter: Payer: Self-pay | Admitting: Gastroenterology

## 2017-08-03 VITALS — BP 155/76 | HR 72 | Ht 62.0 in | Wt 203.4 lb

## 2017-08-03 DIAGNOSIS — K625 Hemorrhage of anus and rectum: Secondary | ICD-10-CM | POA: Diagnosis not present

## 2017-08-03 NOTE — Progress Notes (Signed)
Cephas Darby, MD [address]  [place]  [place], [place] [address]  Main: [phone]  Fax: [phone]    Gastroenterology Consultation  Referring Provider:     Adin Hector, MD Primary Care Physician:  Adin Hector, MD Primary Gastroenterologist:  Dr. Cephas Darby Reason for Consultation:     Painless rectal bleeding        HPI:   Alyssa Duncan is a 77 y.o. female referred by Dr. Caryl Comes, Tonette Bihari, MD  for consultation & management of painless rectal bleeding. Patient went to emergency room on 07/24/2017 secondary to one episode of painless rectal bleeding. Patient reports that she had sensation of having a BM, but saw blood in toilet bowel without any stool. It was a self-limited episode. She got concerned and went to the emergency room. Her hemoglobin is at baseline and normal. She denies recurrent episodes since the ER visit. She denies having episodes prior to the ER visit either. She denies any GI symptoms otherwise  NSAIDs: none  Antiplts/Anticoagulants/Anti thrombotics: none  GI Procedures: colonoscopy in 05/2015, diminutive polyp in ascending colon that was removed Pathology: inflammatory polyp  Past Medical History:  Diagnosis Date  . Acute biliary pancreatitis   . Arthritis   . Asthma   . Back pain   . Breast cancer ([place]) 2013   left breast ductal and lobular carcinoma  . Choledocholithiasis   . Difficult intubation   . Dysrhythmia 12/2015   A-fib  . Family history of adverse reaction to anesthesia    dad had hard time waking up from anesthesia  . Gallstone pancreatitis 05/13/2017  . GERD (gastroesophageal reflux disease)   . Hyperlipidemia   . Hypertension   . Hypothyroidism   . Kidney stones 12/2015  . Personal history of radiation therapy 2013   BREAST CA  . Shingles   . Skin cancer 2005   left arm and nose  . Sleep apnea     Past Surgical History:  Procedure Laterality Date  . BREAST BIOPSY Left 2013   POS  . BREAST  LUMPECTOMY Left 2013   with rad tx  . CARPAL TUNNEL RELEASE    . CATARACT EXTRACTION    . CHOLECYSTECTOMY N/A 05/15/2017   Procedure: LAPAROSCOPIC CHOLECYSTECTOMY WITH INTRAOPERATIVE CHOLANGIOGRAM;  Surgeon: Vickie Epley, MD;  Location: [facility] ORS;  Service: General;  Laterality: N/A;  . COLONOSCOPY WITH PROPOFOL N/A 05/10/2015   Procedure: COLONOSCOPY WITH PROPOFOL;  Surgeon: Hulen Luster, MD;  Location: [facility] ENDOSCOPY;  Service: Gastroenterology;  Laterality: N/A;  . CYSTOSCOPY W/ RETROGRADES Bilateral 01/02/2016   Procedure: CYSTOSCOPY WITH RETROGRADE PYELOGRAM;  Surgeon: Hollice Espy, MD;  Location: [facility] ORS;  Service: Urology;  Laterality: Bilateral;  . CYSTOSCOPY W/ URETERAL STENT PLACEMENT Right 01/02/2016   Procedure: CYSTOSCOPY WITH STENT REPLACEMENT;  Surgeon: Hollice Espy, MD;  Location: [facility] ORS;  Service: Urology;  Laterality: Right;  . CYSTOSCOPY WITH STENT PLACEMENT Right 12/04/2015   Procedure: CYSTOSCOPY WITH STENT PLACEMENT;  Surgeon: Hollice Espy, MD;  Location: [facility] ORS;  Service: Urology;  Laterality: Right;  . ENDOSCOPIC RETROGRADE CHOLANGIOPANCREATOGRAPHY (ERCP) WITH PROPOFOL N/A 05/14/2017   Procedure: ENDOSCOPIC RETROGRADE CHOLANGIOPANCREATOGRAPHY (ERCP) WITH PROPOFOL;  Surgeon: Lucilla Lame, MD;  Location: [facility] ENDOSCOPY;  Service: Endoscopy;  Laterality: N/A;  . JOINT REPLACEMENT Left 2013   hip  . KNEE ARTHROSCOPY    . SEPTOPLASTY    . TRANSURETHRAL RESECTION OF BLADDER TUMOR WITH MITOMYCIN-C N/A 01/02/2016   Procedure:  TRANSURETHRAL RESECTION OF BLADDER TUMOR WITH MITOMYCIN-C;  Surgeon: Hollice Espy, MD;  Location: [facility] ORS;  Service: Urology;  Laterality: N/A;  . URETEROSCOPY WITH HOLMIUM LASER LITHOTRIPSY Right 01/02/2016   Procedure: URETEROSCOPY WITH HOLMIUM LASER LITHOTRIPSY;  Surgeon: Hollice Espy, MD;  Location: [facility] ORS;  Service: Urology;  Laterality: Right;  . UVULOPALATOPHARYNGOPLASTY     and tongue surgery     Current Outpatient Medications:  .   amLODipine (NORVASC) 5 MG tablet, Take 5 mg by mouth every morning. , Disp: , Rfl:  .  anastrozole (ARIMIDEX) 1 MG tablet, TAKE 1 TABLET DAILY, Disp: 30 tablet, Rfl: 1 .  aspirin EC 81 MG tablet, Take 1 tablet (81 mg total) by mouth daily., Disp: 90 tablet, Rfl: 3 .  Calcium Carb-Cholecalciferol (CALCIUM 600+D3) 600-800 MG-UNIT TABS, Take 1 tablet by mouth 2 (two) times daily., Disp: , Rfl:  .  Docusate Sodium (STOOL SOFTENER LAXATIVE PO), Take by mouth., Disp: , Rfl:  .  DORZOLAMIDE HCL-TIMOLOL MAL OP, Place 1 drop into both eyes 2 (two) times daily., Disp: , Rfl:  .  hydrochlorothiazide (HYDRODIURIL) 25 MG tablet, Take 25 mg by mouth daily., Disp: , Rfl:  .  ibuprofen (ADVIL,MOTRIN) 200 MG tablet, Take 400 mg by mouth every 6 (six) hours as needed. , Disp: , Rfl:  .  levothyroxine (SYNTHROID, LEVOTHROID) 75 MCG tablet, Take 75 mcg by mouth daily before breakfast., Disp: , Rfl:  .  loratadine (CLARITIN) 10 MG tablet, Take 10 mg by mouth daily., Disp: , Rfl:  .  losartan (COZAAR) 100 MG tablet, Take 100 mg by mouth every morning. , Disp: , Rfl:  .  lovastatin (MEVACOR) 40 MG tablet, Take 40 mg by mouth at bedtime., Disp: , Rfl:  .  LUMIGAN 0.01 % SOLN, Place 1 drop into both eyes at bedtime. , Disp: , Rfl:  .  metoprolol tartrate (LOPRESSOR) 100 MG tablet, Take 100 mg by mouth 2 (two) times daily., Disp: , Rfl:  .  pantoprazole (PROTONIX) 40 MG tablet, Take 40 mg by mouth daily., Disp: , Rfl:  .  potassium chloride SA (K-DUR,KLOR-CON) 20 MEQ tablet, Take 20 mEq by mouth 2 (two) times daily., Disp: , Rfl:  .  vitamin B-12 (CYANOCOBALAMIN) 1000 MCG tablet, Take 1,000 mcg by mouth daily., Disp: , Rfl:  .  vitamin C (ASCORBIC ACID) 500 MG tablet, Take 500 mg by mouth daily., Disp: , Rfl:    Family History  Problem Relation Age of Onset  . Breast cancer Mother 46  . Kidney cancer Father   . Kidney Stones Brother   . Prostate cancer Neg Hx      Social History   Tobacco Use  . Smoking status:  Never Smoker  . Smokeless tobacco: Never Used  Substance Use Topics  . Alcohol use: No  . Drug use: No    Allergies as of 08/03/2017 - Review Complete 08/03/2017  Allergen Reaction Noted  . Ativan [lorazepam]  12/26/2015  . Cardura [doxazosin] Other (See Comments) 05/09/2015  . Clonidine derivatives Other (See Comments) 05/09/2015  . Enalapril Other (See Comments) 05/09/2015  . Levaquin [levofloxacin] Other (See Comments) 05/09/2015  . Mobic [meloxicam] Other (See Comments) 05/09/2015  . Sulfa antibiotics Nausea Only 05/09/2015  . Vicodin [hydrocodone-acetaminophen] Other (See Comments) 05/09/2015    Review of Systems:    All systems reviewed and negative except where noted in HPI.   Physical Exam:  BP (!) 155/76   Pulse 72   Ht 5\' 2"  (1.575  m)   Wt 203 lb 6.4 oz (92.3 kg)   BMI 37.20 kg/m  No LMP recorded. Patient is postmenopausal.  General:   Alert,  Well-developed, well-nourished, pleasant and cooperative in NAD Head:  Normocephalic and atraumatic. Eyes:  Sclera clear, no icterus.   Conjunctiva pink. Ears:  Normal auditory acuity. Nose:  No deformity, discharge, or lesions. Mouth:  No deformity or lesions,oropharynx pink & moist. Neck:  Supple; no masses or thyromegaly. Lungs:  Respirations even and unlabored.  Clear throughout to auscultation.   No wheezes, crackles, or rhonchi. No acute distress. Heart:  Regular rate and rhythm; no murmurs, clicks, rubs, or gallops. Abdomen:  Normal bowel sounds. Soft, non-tender and non-distended without masses, hepatosplenomegaly or hernias noted.  No guarding or rebound tenderness.   Rectal: Not performed Msk:  Symmetrical without gross deformities. Good, equal movement & strength bilaterally. Pulses:  Normal pulses noted. Extremities:  No clubbing or edema.  No cyanosis. Neurologic:  Alert and oriented x3;  grossly normal neurologically. Skin:  Intact without significant lesions or rashes. No jaundice. Lymph Nodes:  No  significant cervical adenopathy. Psych:  Alert and cooperative. Normal mood and affect.  Imaging Studies: reviewed  Assessment and Plan:   Alyssa Duncan is a 77 y.o. Caucasian female with 1 episode of painless rectal bleeding, self-limited. This is most likely bleeding from internal hemorrhoids. She had a colonoscopy in 2017 which was unremarkable. I discussed with her about outpatient hemorrhoid ligation if her bleeding recurs. I also discussed with her about topical steroid cream for symptomatic hemorrhoids   Follow up as needed   Cephas Darby, MD

## 2017-08-19 ENCOUNTER — Other Ambulatory Visit: Payer: Self-pay | Admitting: Urgent Care

## 2017-08-25 ENCOUNTER — Ambulatory Visit
Admission: RE | Admit: 2017-08-25 | Discharge: 2017-08-25 | Disposition: A | Discharge status: Home / Self Care | Payer: Medicare Other | Source: Ambulatory Visit | Attending: Urgent Care | Admitting: Urgent Care

## 2017-08-25 DIAGNOSIS — D0512 Intraductal carcinoma in situ of left breast: Secondary | ICD-10-CM | POA: Diagnosis not present

## 2017-08-25 DIAGNOSIS — Z79811 Long term (current) use of aromatase inhibitors: Secondary | ICD-10-CM | POA: Diagnosis not present

## 2017-08-25 DIAGNOSIS — Z79890 Hormone replacement therapy (postmenopausal): Secondary | ICD-10-CM | POA: Insufficient documentation

## 2017-08-25 DIAGNOSIS — Z13820 Encounter for screening for osteoporosis: Secondary | ICD-10-CM | POA: Diagnosis present

## 2017-10-23 ENCOUNTER — Encounter: Payer: Self-pay | Admitting: Cardiovascular Disease

## 2017-10-23 ENCOUNTER — Ambulatory Visit: Payer: Medicare Other | Admitting: Cardiovascular Disease

## 2017-10-23 VITALS — BP 120/62 | HR 68 | Ht 62.0 in | Wt 202.8 lb

## 2017-10-23 DIAGNOSIS — I480 Paroxysmal atrial fibrillation: Secondary | ICD-10-CM

## 2017-10-23 DIAGNOSIS — I10 Essential (primary) hypertension: Secondary | ICD-10-CM

## 2017-10-23 NOTE — Progress Notes (Signed)
Cardiology Office Note   Date:  10/23/2017   ID:  Alyssa Duncan, DOB 09-22-1940, MRN [id number]  PCP:  Adin Hector, MD  Cardiologist:   Kathlyn Sacramento, MD   Chief Complaint  Patient presents with  . OTHER    12 Month f/u c/o knee pain/weakness/stiffness. Meds reviewed verbally with pt.      History of Present Illness: Alyssa Duncan is a 77 y.o. female who presents for a follow-up visit for paroxysmal atrial fibrillation. She has no previous cardiac history. She Had atrial fibrillation with RVR in 2017  in the setting of sepsis and bacteremia due to obstructive urethral stones.  She had an echocardiogram done which showed normal LV systolic function, mild mitral and aortic regurgitation and normal atrial size. Pulmonary pressure was normal. She had no recurrent arrhythmia off amiodarone. She was taken off anticoagulation given that atrial fibrillation was in the setting of acute illness.   She had no evidence of recurrent atrial fibrillation over the last year.  She has been doing well with no recent chest pain or worsening dyspnea.  No palpitations.  She had cholecystectomy done in February of this year with no complications.  She had an echocardiogram done during that hospitalization that showed normal LV systolic function with trivial aortic regurgitation.  Past Medical History:  Diagnosis Date  . Acute biliary pancreatitis   . Arthritis   . Asthma   . Back pain   . Breast cancer ([place]) 2013   left breast ductal and lobular carcinoma  . Choledocholithiasis   . Difficult intubation   . Dysrhythmia 12/2015   A-fib  . Family history of adverse reaction to anesthesia    dad had hard time waking up from anesthesia  . Gallstone pancreatitis 05/13/2017  . GERD (gastroesophageal reflux disease)   . Hyperlipidemia   . Hypertension   . Hypothyroidism   . Kidney stones 12/2015  . Personal history of radiation therapy 2013   BREAST CA  . Shingles   . Skin cancer 2005   left arm and nose  . Sleep apnea     Past Surgical History:  Procedure Laterality Date  . BREAST BIOPSY Left 2013   POS  . BREAST LUMPECTOMY Left 2013   with rad tx  . CARPAL TUNNEL RELEASE    . CATARACT EXTRACTION    . CHOLECYSTECTOMY N/A 05/15/2017   Procedure: LAPAROSCOPIC CHOLECYSTECTOMY WITH INTRAOPERATIVE CHOLANGIOGRAM;  Surgeon: Vickie Epley, MD;  Location: [facility] ORS;  Service: General;  Laterality: N/A;  . COLONOSCOPY WITH PROPOFOL N/A 05/10/2015   Procedure: COLONOSCOPY WITH PROPOFOL;  Surgeon: Hulen Luster, MD;  Location: [facility] ENDOSCOPY;  Service: Gastroenterology;  Laterality: N/A;  . CYSTOSCOPY W/ RETROGRADES Bilateral 01/02/2016   Procedure: CYSTOSCOPY WITH RETROGRADE PYELOGRAM;  Surgeon: Hollice Espy, MD;  Location: [facility] ORS;  Service: Urology;  Laterality: Bilateral;  . CYSTOSCOPY W/ URETERAL STENT PLACEMENT Right 01/02/2016   Procedure: CYSTOSCOPY WITH STENT REPLACEMENT;  Surgeon: Hollice Espy, MD;  Location: [facility] ORS;  Service: Urology;  Laterality: Right;  . CYSTOSCOPY WITH STENT PLACEMENT Right 12/04/2015   Procedure: CYSTOSCOPY WITH STENT PLACEMENT;  Surgeon: Hollice Espy, MD;  Location: [facility] ORS;  Service: Urology;  Laterality: Right;  . ENDOSCOPIC RETROGRADE CHOLANGIOPANCREATOGRAPHY (ERCP) WITH PROPOFOL N/A 05/14/2017   Procedure: ENDOSCOPIC RETROGRADE CHOLANGIOPANCREATOGRAPHY (ERCP) WITH PROPOFOL;  Surgeon: Lucilla Lame, MD;  Location: [facility] ENDOSCOPY;  Service: Endoscopy;  Laterality: N/A;  . JOINT REPLACEMENT Left 2013   hip  . KNEE  ARTHROSCOPY    . SEPTOPLASTY    . TRANSURETHRAL RESECTION OF BLADDER TUMOR WITH MITOMYCIN-C N/A 01/02/2016   Procedure: TRANSURETHRAL RESECTION OF BLADDER TUMOR WITH MITOMYCIN-C;  Surgeon: Hollice Espy, MD;  Location: [facility] ORS;  Service: Urology;  Laterality: N/A;  . URETEROSCOPY WITH HOLMIUM LASER LITHOTRIPSY Right 01/02/2016   Procedure: URETEROSCOPY WITH HOLMIUM LASER LITHOTRIPSY;  Surgeon: Hollice Espy, MD;  Location: [facility] ORS;   Service: Urology;  Laterality: Right;  . UVULOPALATOPHARYNGOPLASTY     and tongue surgery     Current Outpatient Medications  Medication Sig Dispense Refill  . amLODipine (NORVASC) 5 MG tablet Take 5 mg by mouth every morning.     Marland Kitchen anastrozole (ARIMIDEX) 1 MG tablet TAKE ONE TABLET BY MOUTH EVERY DAY 30 tablet 3  . aspirin EC 81 MG tablet Take 1 tablet (81 mg total) by mouth daily. 90 tablet 3  . Calcium Carb-Cholecalciferol (CALCIUM 600+D3) 600-800 MG-UNIT TABS Take 1 tablet by mouth 2 (two) times daily.    Mariane Baumgarten Sodium (STOOL SOFTENER LAXATIVE PO) Take by mouth.    . DORZOLAMIDE HCL-TIMOLOL MAL OP Place 1 drop into both eyes 2 (two) times daily.    . hydrochlorothiazide (HYDRODIURIL) 25 MG tablet Take 25 mg by mouth daily.    Marland Kitchen ibuprofen (ADVIL,MOTRIN) 200 MG tablet Take 400 mg by mouth every 6 (six) hours as needed.     Marland Kitchen levothyroxine (SYNTHROID, LEVOTHROID) 75 MCG tablet Take 75 mcg by mouth daily before breakfast.    . loratadine (CLARITIN) 10 MG tablet Take 10 mg by mouth daily.    Marland Kitchen losartan (COZAAR) 100 MG tablet Take 100 mg by mouth every morning.     . lovastatin (MEVACOR) 40 MG tablet Take 40 mg by mouth at bedtime.    Marland Kitchen LUMIGAN 0.01 % SOLN Place 1 drop into both eyes at bedtime.     . metoprolol tartrate (LOPRESSOR) 100 MG tablet Take 100 mg by mouth 2 (two) times daily.    . pantoprazole (PROTONIX) 40 MG tablet Take 40 mg by mouth daily.    . potassium chloride SA (K-DUR,KLOR-CON) 20 MEQ tablet Take 20 mEq by mouth 2 (two) times daily.    . vitamin B-12 (CYANOCOBALAMIN) 1000 MCG tablet Take 1,000 mcg by mouth daily.    . vitamin C (ASCORBIC ACID) 500 MG tablet Take 500 mg by mouth daily.     No current facility-administered medications for this visit.     Allergies:   Ativan [lorazepam]; Cardura [doxazosin]; Clonidine derivatives; Enalapril; Levaquin [levofloxacin]; Mobic [meloxicam]; Sulfa antibiotics; and Vicodin [hydrocodone-acetaminophen]    Social History:   The patient  reports that she has never smoked. She has never used smokeless tobacco. She reports that she does not drink alcohol or use drugs.   Family History:  The patient's family history includes Breast cancer (age of onset: 32) in her mother; Kidney Stones in her brother; Kidney cancer in her father.    ROS:  Please see the history of present illness.   Otherwise, review of systems are positive for none.   All other systems are reviewed and negative.    PHYSICAL EXAM: VS:  BP 120/62 (BP Location: Left Arm, Patient Position: Sitting, Cuff Size: Large)   Pulse 68   Ht 5\' 2"  (1.575 m)   Wt 202 lb 12 oz (92 kg)   BMI 37.08 kg/m  , BMI Body mass index is 37.08 kg/m. GEN: Well nourished, well developed, in no acute distress  HEENT: normal  Neck: no JVD, carotid bruits, or masses Cardiac: RRR; no murmurs, rubs, or gallops,no edema  Respiratory:  clear to auscultation bilaterally, normal work of breathing GI: soft, nontender, nondistended, + BS MS: no deformity or atrophy  Skin: warm and dry, no rash Neuro:  Strength and sensation are intact Psych: euthymic mood, full affect   EKG:  EKG is ordered today. The ekg ordered today demonstrates normal sinus rhythm with no significant ST or T wave changes.  Recent Labs: 07/24/2017: ALT 41; BUN 16; Creatinine, Ser 0.88; Hemoglobin 15.1; Platelets 307; Potassium 3.2; Sodium 141    Lipid Panel No results found for: CHOL, TRIG, HDL, CHOLHDL, VLDL, LDLCALC, LDLDIRECT    Wt Readings from Last 3 Encounters:  10/23/17 202 lb 12 oz (92 kg)  08/03/17 203 lb 6.4 oz (92.3 kg)  07/29/17 200 lb (90.7 kg)       No flowsheet data found.    ASSESSMENT AND PLAN:  1.  Paroxysmal atrial fibrillation: The patient had one episode of atrial fibrillation in the setting of sepsis and respiratory distress  In 2017.  No evidence of recurrent arrhythmia since then.  No need for long-term anticoagulation unless she develops recurrent atrial  fibrillation.  2. Essential hypertension: Blood pressure is controlled on medications.    Disposition:   FU with me as needed.  Signed,  Kathlyn Sacramento, MD  10/23/2017 3:17 PM    [place]

## 2017-10-23 NOTE — Patient Instructions (Addendum)
Medication Instructions: Your physician recommends that you continue on your current medications as directed. Please refer to the Current Medication list given to you today.  If you need a refill on your cardiac medications before your next appointment, please call your pharmacy.   Follow-Up: Your physician wants you to follow-up in as needed with Dr. Fletcher Anon.  Thank you for choosing [facility]!

## 2017-10-27 ENCOUNTER — Encounter: Payer: Self-pay | Admitting: Urology

## 2017-10-27 ENCOUNTER — Ambulatory Visit: Payer: Medicare Other | Admitting: Urology

## 2017-10-27 VITALS — BP 150/71 | HR 65 | Ht 62.0 in | Wt 200.0 lb

## 2017-10-27 DIAGNOSIS — C679 Malignant neoplasm of bladder, unspecified: Secondary | ICD-10-CM

## 2017-10-27 LAB — URINALYSIS, COMPLETE
BILIRUBIN UA: NEGATIVE
GLUCOSE, UA: NEGATIVE
KETONES UA: NEGATIVE
NITRITE UA: NEGATIVE
SPEC GRAV UA: 1.015 (ref 1.005–1.030)
UUROB: 0.2 mg/dL (ref 0.2–1.0)
pH, UA: 7.5 (ref 5.0–7.5)

## 2017-10-27 LAB — MICROSCOPIC EXAMINATION

## 2017-10-27 NOTE — Progress Notes (Signed)
   10/27/17  CC:  Chief Complaint  Patient presents with  . Cysto    HPI: 77 year old female admitted on 12/03/2015 with fevers, right flank pain found to have an obstructing right distal 5 mm ureteral stone. She was taken to the operating room for cystoscopy, right ureteral stent placement in the setting of worsening leukocytosis and uncontrolled pain.  Intraoperatively, an incidental bladder tumor was identified.    She returned to the operating room on 01/02/2016 for definitive management of her stone via ureteroscopy, laser lithotripsy followed by TURBT. 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  Cysto in office on 05/01/2016 showed a very small low-grade appearing recurrence ~2 mm, anterior bladder wall in close proximity to the bladder neck. She underwent cystoscopy, fulguration of this lesion in the office shortly thereafter which was well tolerated.    She returns today for routine follow up.    Height 5\' 2"  (1.575 m), weight 203 lb (92.1 kg). NED. A&Ox3.   No respiratory distress   Abd soft, NT, ND Normal external genitalia with patent urethral meatus  Cystoscopy/ Bladder lesion fulgeration procedure Note  Patient identification was confirmed, informed consent was obtained, and patient was prepped using Betadine solution.  Lidocaine jelly was instilled into the bladder and allowed to dwell for 30 minutes.  Preoperative abx where received prior to procedure.    Procedure: - Flexible cystoscope introduced, without any difficulty.   - Thorough search of the bladder revealed:    normal urethral meatus    normal urothelium with subtle carpeting of papillary change adjacent to a stellate scar on the right posterior lateral bladder wall, approximately 1 cm area which is mildly erythematous as well, highly concerning for superficial recurrence    no stones    no ulcers     no  urethral polyps    no trabeculation  - Ureteral orifices were normal in position and appearance.  Retroflexion showed no evidence of bladder tumor and bladder neck  Post-Procedure: - Patient tolerated the procedure well  Assessment/ Plan:  1. 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) H/o incidental bladder tumor, LgTa 1 cm s/p TURBT 12/2015 Small low-grade appearing recurrence 04/2016 Cysto today concerning for early recurrence Discussed options for management of this area, which could include repeating cystoscopy in a few months to assess for interval improvement versus worsening versus going out of proceeding to the operating room for cystoscopy, TURBT, bilateral retrograde pyelogram and intravesical mitomycin She is more interested in pursuing operative intervention in grand taken care of the lesion as well as obtaining tissue for biopsy We discussed the risks of surgery including risk of bleeding, infection, damage surrounding structures, and bladder irritation All questions answered - ciprofloxacin (CIPRO) tablet 500 mg; Take 1 tablet (500 mg total) by mouth once. - lidocaine (XYLOCAINE) 2 % jelly 1 application; Place 1 application into the urethra once.  2. History of kidney stones Currently asymptomatic.  RUS 07/2017 negative  Hollice Espy, MD

## 2017-10-27 NOTE — H&P (View-Only) (Signed)
   10/27/17  CC:  Chief Complaint  Patient presents with  . Cysto    HPI: 77 year old female admitted on 12/03/2015 with fevers, right flank pain found to have an obstructing right distal 5 mm ureteral stone. She was taken to the operating room for cystoscopy, right ureteral stent placement in the setting of worsening leukocytosis and uncontrolled pain.  Intraoperatively, an incidental bladder tumor was identified.    She returned to the operating room on 01/02/2016 for definitive management of her stone via ureteroscopy, laser lithotripsy followed by TURBT. 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  Cysto in office on 05/01/2016 showed a very small low-grade appearing recurrence ~2 mm, anterior bladder wall in close proximity to the bladder neck. She underwent cystoscopy, fulguration of this lesion in the office shortly thereafter which was well tolerated.    She returns today for routine follow up.    Height 5\' 2"  (1.575 m), weight 203 lb (92.1 kg). NED. A&Ox3.   No respiratory distress   Abd soft, NT, ND Normal external genitalia with patent urethral meatus  Cystoscopy/ Bladder lesion fulgeration procedure Note  Patient identification was confirmed, informed consent was obtained, and patient was prepped using Betadine solution.  Lidocaine jelly was instilled into the bladder and allowed to dwell for 30 minutes.  Preoperative abx where received prior to procedure.    Procedure: - Flexible cystoscope introduced, without any difficulty.   - Thorough search of the bladder revealed:    normal urethral meatus    normal urothelium with subtle carpeting of papillary change adjacent to a stellate scar on the right posterior lateral bladder wall, approximately 1 cm area which is mildly erythematous as well, highly concerning for superficial recurrence    no stones    no ulcers     no  urethral polyps    no trabeculation  - Ureteral orifices were normal in position and appearance.  Retroflexion showed no evidence of bladder tumor and bladder neck  Post-Procedure: - Patient tolerated the procedure well  Assessment/ Plan:  1. 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) H/o incidental bladder tumor, LgTa 1 cm s/p TURBT 12/2015 Small low-grade appearing recurrence 04/2016 Cysto today concerning for early recurrence Discussed options for management of this area, which could include repeating cystoscopy in a few months to assess for interval improvement versus worsening versus going out of proceeding to the operating room for cystoscopy, TURBT, bilateral retrograde pyelogram and intravesical mitomycin She is more interested in pursuing operative intervention in grand taken care of the lesion as well as obtaining tissue for biopsy We discussed the risks of surgery including risk of bleeding, infection, damage surrounding structures, and bladder irritation All questions answered - ciprofloxacin (CIPRO) tablet 500 mg; Take 1 tablet (500 mg total) by mouth once. - lidocaine (XYLOCAINE) 2 % jelly 1 application; Place 1 application into the urethra once.  2. History of kidney stones Currently asymptomatic.  RUS 07/2017 negative  Hollice Espy, MD

## 2017-10-28 ENCOUNTER — Other Ambulatory Visit: Payer: Self-pay | Admitting: Radiology

## 2017-10-28 DIAGNOSIS — C679 Malignant neoplasm of bladder, unspecified: Secondary | ICD-10-CM

## 2017-10-29 ENCOUNTER — Other Ambulatory Visit: Payer: Self-pay

## 2017-10-29 ENCOUNTER — Telehealth: Payer: Self-pay | Admitting: Urology

## 2017-10-29 ENCOUNTER — Encounter
Admission: RE | Admit: 2017-10-29 | Discharge: 2017-10-29 | Disposition: A | Discharge status: Home / Self Care | Payer: Medicare Other | Source: Ambulatory Visit | Attending: Urology | Admitting: Urology

## 2017-10-29 DIAGNOSIS — Z01812 Encounter for preprocedural laboratory examination: Secondary | ICD-10-CM | POA: Diagnosis present

## 2017-10-29 HISTORY — DX: Personal history of urinary calculi: Z87.442

## 2017-10-29 HISTORY — DX: Malignant neoplasm of bladder, unspecified: C67.9

## 2017-10-29 LAB — BASIC METABOLIC PANEL
ANION GAP: 9 (ref 5–15)
BUN: 15 mg/dL (ref 8–23)
CALCIUM: 9.4 mg/dL (ref 8.9–10.3)
CHLORIDE: 100 mmol/L (ref 98–111)
CO2: 32 mmol/L (ref 22–32)
CREATININE: 0.98 mg/dL (ref 0.44–1.00)
GFR calc non Af Amer: 54 mL/min — ABNORMAL LOW (ref >60)
GLUCOSE: 131 mg/dL — AB (ref 70–99)
Potassium: 3.1 mmol/L — ABNORMAL LOW (ref 3.5–5.1)
Sodium: 141 mmol/L (ref 135–145)

## 2017-10-29 LAB — CBC
HEMATOCRIT: 43.1 % (ref 35.0–47.0)
HEMOGLOBIN: 14.7 g/dL (ref 12.0–16.0)
MCH: 31.1 pg (ref 26.0–34.0)
MCHC: 34 g/dL (ref 32.0–36.0)
MCV: 91.5 fL (ref 80.0–100.0)
Platelets: 263 10*3/uL (ref 150–440)
RBC: 4.71 MIL/uL (ref 3.80–5.20)
RDW: 13.7 % (ref 11.5–14.5)
WBC: 7.3 10*3/uL (ref 3.6–11.0)

## 2017-10-29 LAB — CULTURE, URINE COMPREHENSIVE

## 2017-10-29 NOTE — Telephone Encounter (Signed)
Made patient aware of low potassium level. Patient states she has been taking the supplement regularly. Advised patient to eat potassium rich foods such as bananas the evening before surgery & to notify PCP of low potassium level. Questions answered. Patient voices understanding.

## 2017-10-29 NOTE — Patient Instructions (Signed)
Your procedure is scheduled on: Monday 11/02/17 Report to [place]. To find out your arrival time please call [phone] between 1PM - 3PM on Friday 10/30/17.  Remember: Instructions that are not followed completely may result in serious medical risk, up to and including death, or upon the discretion of your surgeon and anesthesiologist your surgery may need to be rescheduled.     _X__ 1. Do not eat food after midnight the night before your procedure.                 No gum chewing or hard candies. You may drink clear liquids up to 2 hours                 before you are scheduled to arrive for your surgery- DO not drink clear                 liquids within 2 hours of the start of your surgery.                 Clear Liquids include:  water, apple juice without pulp, clear carbohydrate                 drink such as Clearfast or Gatorade, Black Coffee or Tea (Do not add                 anything to coffee or tea).  __X__2.  On the morning of surgery brush your teeth with toothpaste and water, you                 may rinse your mouth with mouthwash if you wish.  Do not swallow any              toothpaste of mouthwash.     _X__ 3.  No Alcohol for 24 hours before or after surgery.   _X__ 4.  Do Not Smoke or use e-cigarettes For 24 Hours Prior to Your Surgery.                 Do not use any chewable tobacco products for at least 6 hours prior to                 surgery.  ____  5.  Bring all medications with you on the day of surgery if instructed.   __X__  6.  Notify your doctor if there is any change in your medical condition      (cold, fever, infections).     Do not wear jewelry, make-up, hairpins, clips or nail polish. Do not wear lotions, powders, or perfumes.  Do not shave 48 hours prior to surgery. Men may shave face and neck. Do not bring valuables to the hospital.    [facility] is not responsible for any belongings or  valuables.  Contacts, dentures/partials or body piercings may not be worn into surgery. Bring a case for your contacts, glasses or hearing aids, a denture cup will be supplied. Leave your suitcase in the car. After surgery it may be brought to your room. For patients admitted to the hospital, discharge time is determined by your treatment team.   Patients discharged the day of surgery will not be allowed to drive home.   Please read over the following fact sheets that you were given:   MRSA Information  __X__ Take these medicines the morning of surgery with A SIP OF WATER:  1. amLODipine (NORVASC)  2. anastrozole (ARIMIDEX)  3. levothyroxine (SYNTHROID  4. loratadine (CLARITIN)  5. metoprolol tartrate (LOPRESSOR)   6. pantoprazole (PROTONIX  ____ Fleet Enema (as directed)   ____ Use CHG Soap/SAGE wipes as directed  ____ Use inhalers on the day of surgery  ____ Stop metformin/Janumet/Farxiga 2 days prior to surgery    ____ Take 1/2 of usual insulin dose the night before surgery. No insulin the morning          of surgery.   ____ Stop Blood Thinners Coumadin/Plavix/Xarelto/Pleta/Pradaxa/Eliquis/Effient/Aspirin  on   Or contact your Surgeon, Cardiologist or Medical Doctor regarding  ability to stop your blood thinners  __X__ Stop Anti-inflammatories 7 days before surgery such as Advil, Ibuprofen, Motrin,  BC or Goodies Powder, Naprosyn, Naproxen, Aleve, Aspirin YOU MAY TAKE ASPIRIN   __X__ Stop all herbal supplements, fish oil or vitamin E until after surgery.    __X__ Bring C-Pap to the hospital.

## 2017-10-29 NOTE — Telephone Encounter (Signed)
-----   Message from Hollice Espy, MD sent at 10/29/2017  2:32 PM EDT ----- No treatment.  Eat banana.  Continue supplements.  REcheck on day of surgery.  It was fine just 4 months ago.    Hollice Espy, MD  ----- Message ----- From: Ranell Patrick, RN Sent: 10/29/2017   1:24 PM To: Jack Mineau Linton Flemings, RN, Hollice Espy, MD  Potassium level drawn today (10/28/2017) was 3.1. Patient is already taking a potassium supplement. TURBT is scheduled Monday 11/02/2017. Please advise.

## 2017-10-29 NOTE — Pre-Procedure Instructions (Signed)
LM on answering machine regarding patients 3.1 K+ level and need for supplement for Amy at Dr Cherrie Gauze office. I-stat ordered for DOS per protocol.

## 2017-11-01 MED ORDER — CEFAZOLIN SODIUM-DEXTROSE 2-4 GM/100ML-% IV SOLN: 2.0000 g | INTRAVENOUS | Status: DC

## 2017-11-01 MED ORDER — MITOMYCIN CHEMO FOR BLADDER INSTILLATION 40 MG
40.0000 mg | Freq: Once | INTRAVENOUS | Status: DC
  Filled 2017-11-01: qty 40

## 2017-11-02 ENCOUNTER — Encounter: Payer: Self-pay | Admitting: Anesthesiology

## 2017-11-02 ENCOUNTER — Other Ambulatory Visit: Payer: Self-pay | Admitting: Radiology

## 2017-11-02 ENCOUNTER — Encounter
Admission: RE | Disposition: A | Discharge status: Home / Self Care | Payer: Self-pay | Source: Ambulatory Visit | Attending: Urology

## 2017-11-02 ENCOUNTER — Other Ambulatory Visit: Payer: Self-pay

## 2017-11-02 ENCOUNTER — Ambulatory Visit
Admission: RE | Admit: 2017-11-02 | Discharge: 2017-11-02 | Disposition: A | Discharge status: Home / Self Care | Payer: Medicare Other | Source: Ambulatory Visit | Attending: Urology | Admitting: Urology

## 2017-11-02 DIAGNOSIS — N329 Bladder disorder, unspecified: Secondary | ICD-10-CM | POA: Insufficient documentation

## 2017-11-02 DIAGNOSIS — C679 Malignant neoplasm of bladder, unspecified: Secondary | ICD-10-CM

## 2017-11-02 DIAGNOSIS — Z5309 Procedure and treatment not carried out because of other contraindication: Secondary | ICD-10-CM | POA: Insufficient documentation

## 2017-11-02 DIAGNOSIS — Z8551 Personal history of malignant neoplasm of bladder: Secondary | ICD-10-CM | POA: Diagnosis present

## 2017-11-02 SURGERY — TRANSURETHRAL RESECTION OF BLADDER TUMOR WITH MITOMYCIN-C
Anesthesia: Choice

## 2017-11-02 MED ORDER — CEFAZOLIN SODIUM-DEXTROSE 2-4 GM/100ML-% IV SOLN
INTRAVENOUS | Status: AC
  Filled 2017-11-02: qty 100

## 2017-11-02 MED ORDER — LACTATED RINGERS IV SOLN: INTRAVENOUS | Status: DC

## 2017-11-02 SURGICAL SUPPLY — 33 items
BAG DRAIN CYSTO-URO LG1000N (MISCELLANEOUS) ×4 IMPLANT
BAG URINE DRAINAGE (UROLOGICAL SUPPLIES) ×4 IMPLANT
BRUSH SCRUB EZ  4% CHG (MISCELLANEOUS) ×2
BRUSH SCRUB EZ 4% CHG (MISCELLANEOUS) ×2 IMPLANT
CATH FOLEY 2WAY  5CC 16FR (CATHETERS) ×2
CATH URETL 5X70 OPEN END (CATHETERS) ×4 IMPLANT
CATH URTH 16FR FL 2W BLN LF (CATHETERS) ×2 IMPLANT
CONRAY 43 FOR UROLOGY 50M (MISCELLANEOUS) ×4 IMPLANT
DRAPE UTILITY 15X26 TOWEL STRL (DRAPES) ×4 IMPLANT
DRSG TELFA 4X3 1S NADH ST (GAUZE/BANDAGES/DRESSINGS) ×4 IMPLANT
ELECT LOOP 22F BIPOLAR SML (ELECTROSURGICAL) ×4
ELECT REM PT RETURN 9FT ADLT (ELECTROSURGICAL)
ELECTRODE LOOP 22F BIPOLAR SML (ELECTROSURGICAL) ×2 IMPLANT
ELECTRODE REM PT RTRN 9FT ADLT (ELECTROSURGICAL) IMPLANT
GLOVE BIO SURGEON STRL SZ 6.5 (GLOVE) ×3 IMPLANT
GLOVE BIO SURGEONS STRL SZ 6.5 (GLOVE) ×1
GOWN STRL REUS W/ TWL LRG LVL3 (GOWN DISPOSABLE) ×4 IMPLANT
GOWN STRL REUS W/TWL LRG LVL3 (GOWN DISPOSABLE) ×4
KIT TURNOVER CYSTO (KITS) ×4 IMPLANT
LOOP CUT BIPOLAR 24F LRG (ELECTROSURGICAL) IMPLANT
NDL SAFETY ECLIPSE 18X1.5 (NEEDLE) ×2 IMPLANT
NEEDLE HYPO 18GX1.5 SHARP (NEEDLE) ×2
PACK CYSTO AR (MISCELLANEOUS) ×4 IMPLANT
SENSORWIRE 0.038 NOT ANGLED (WIRE) ×4
SET CYSTO W/LG BORE CLAMP LF (SET/KITS/TRAYS/PACK) ×4 IMPLANT
SET IRRIG Y TYPE TUR BLADDER L (SET/KITS/TRAYS/PACK) ×4 IMPLANT
SET IRRIGATING DISP (SET/KITS/TRAYS/PACK) ×4 IMPLANT
SOL .9 NS 3000ML IRR  AL (IV SOLUTION) ×2
SOL .9 NS 3000ML IRR UROMATIC (IV SOLUTION) ×2 IMPLANT
SURGILUBE 2OZ TUBE FLIPTOP (MISCELLANEOUS) ×4 IMPLANT
SYRINGE IRR TOOMEY STRL 70CC (SYRINGE) ×4 IMPLANT
WATER STERILE IRR 1000ML POUR (IV SOLUTION) ×4 IMPLANT
WIRE SENSOR 0.038 NOT ANGLED (WIRE) ×2 IMPLANT

## 2017-11-02 NOTE — Progress Notes (Signed)
Patient ate 2 bananas AM of surgery (patient stated that Dr. Audree Bane offices told her to). Patient surgery canceled today. Dr. Erlene Quan to come see patient before she leaves.

## 2017-11-03 MED ORDER — CEFAZOLIN SODIUM-DEXTROSE 2-4 GM/100ML-% IV SOLN
INTRAVENOUS | Status: AC
  Filled 2017-11-03: qty 100

## 2017-11-04 ENCOUNTER — Ambulatory Visit: Payer: Medicare Other | Admitting: Anesthesiology

## 2017-11-04 ENCOUNTER — Encounter
Admission: RE | Disposition: A | Discharge status: Home / Self Care | Payer: Self-pay | Source: Ambulatory Visit | Attending: Urology

## 2017-11-04 ENCOUNTER — Encounter: Payer: Self-pay | Admitting: *Deleted

## 2017-11-04 ENCOUNTER — Ambulatory Visit
Admission: RE | Admit: 2017-11-04 | Discharge: 2017-11-04 | Disposition: A | Discharge status: Home / Self Care | Payer: Medicare Other | Source: Ambulatory Visit | Attending: Urology | Admitting: Urology

## 2017-11-04 DIAGNOSIS — K219 Gastro-esophageal reflux disease without esophagitis: Secondary | ICD-10-CM | POA: Diagnosis not present

## 2017-11-04 DIAGNOSIS — J45909 Unspecified asthma, uncomplicated: Secondary | ICD-10-CM | POA: Insufficient documentation

## 2017-11-04 DIAGNOSIS — Z87442 Personal history of urinary calculi: Secondary | ICD-10-CM | POA: Diagnosis not present

## 2017-11-04 DIAGNOSIS — C679 Malignant neoplasm of bladder, unspecified: Secondary | ICD-10-CM

## 2017-11-04 DIAGNOSIS — I10 Essential (primary) hypertension: Secondary | ICD-10-CM | POA: Insufficient documentation

## 2017-11-04 DIAGNOSIS — G4730 Sleep apnea, unspecified: Secondary | ICD-10-CM | POA: Insufficient documentation

## 2017-11-04 DIAGNOSIS — Z8551 Personal history of malignant neoplasm of bladder: Secondary | ICD-10-CM | POA: Diagnosis present

## 2017-11-04 HISTORY — PX: CYSTOSCOPY W/ RETROGRADES: SHX1426

## 2017-11-04 LAB — POCT I-STAT 4, (NA,K, GLUC, HGB,HCT)
Glucose, Bld: 96 mg/dL (ref 70–99)
HCT: 41 % (ref 36.0–46.0)
Hemoglobin: 13.9 g/dL (ref 12.0–15.0)
Potassium: 3.2 mmol/L — ABNORMAL LOW (ref 3.5–5.1)
Sodium: 142 mmol/L (ref 135–145)

## 2017-11-04 SURGERY — CYSTOSCOPY, WITH RETROGRADE PYELOGRAM
Anesthesia: General

## 2017-11-04 MED ORDER — ONDANSETRON HCL 4 MG/2ML IJ SOLN
4.0000 mg | Freq: Once | INTRAMUSCULAR | Status: AC | PRN
  Administered 2017-11-04: 4 mg via INTRAVENOUS

## 2017-11-04 MED ORDER — MITOMYCIN CHEMO FOR BLADDER INSTILLATION 40 MG
40.0000 mg | Freq: Once | INTRAVENOUS | Status: DC
  Filled 2017-11-04: qty 40

## 2017-11-04 MED ORDER — CEFAZOLIN SODIUM-DEXTROSE 2-4 GM/100ML-% IV SOLN
2.0000 g | INTRAVENOUS | Status: AC
  Administered 2017-11-04: 2 g via INTRAVENOUS

## 2017-11-04 MED ORDER — ONDANSETRON HCL 4 MG/2ML IJ SOLN
INTRAMUSCULAR | Status: DC | PRN
  Administered 2017-11-04: 4 mg via INTRAVENOUS

## 2017-11-04 MED ORDER — FENTANYL CITRATE (PF) 100 MCG/2ML IJ SOLN
INTRAMUSCULAR | Status: DC | PRN
  Administered 2017-11-04: 25 ug via INTRAVENOUS

## 2017-11-04 MED ORDER — FENTANYL CITRATE (PF) 100 MCG/2ML IJ SOLN
INTRAMUSCULAR | Status: AC
  Filled 2017-11-04: qty 2

## 2017-11-04 MED ORDER — LACTATED RINGERS IV SOLN
500.0000 mL | INTRAVENOUS | Status: DC
  Administered 2017-11-04: 1000 mL via INTRAVENOUS

## 2017-11-04 MED ORDER — FENTANYL CITRATE (PF) 100 MCG/2ML IJ SOLN: 25.0000 ug | INTRAMUSCULAR | Status: DC | PRN

## 2017-11-04 MED ORDER — DEXAMETHASONE SODIUM PHOSPHATE 10 MG/ML IJ SOLN
INTRAMUSCULAR | Status: AC
  Filled 2017-11-04: qty 1

## 2017-11-04 MED ORDER — LIDOCAINE HCL (CARDIAC) PF 100 MG/5ML IV SOSY
PREFILLED_SYRINGE | INTRAVENOUS | Status: DC | PRN
  Administered 2017-11-04: 60 mg via INTRAVENOUS

## 2017-11-04 MED ORDER — ONDANSETRON HCL 4 MG/2ML IJ SOLN
INTRAMUSCULAR | Status: AC
  Filled 2017-11-04: qty 2

## 2017-11-04 MED ORDER — PROPOFOL 10 MG/ML IV BOLUS
INTRAVENOUS | Status: AC
  Filled 2017-11-04: qty 20

## 2017-11-04 MED ORDER — DEXAMETHASONE SODIUM PHOSPHATE 10 MG/ML IJ SOLN
INTRAMUSCULAR | Status: DC | PRN
  Administered 2017-11-04: 6 mg via INTRAVENOUS

## 2017-11-04 MED ORDER — SODIUM CHLORIDE 0.9 % IJ SOLN
INTRAMUSCULAR | Status: AC
  Filled 2017-11-04: qty 10

## 2017-11-04 MED ORDER — MIDAZOLAM HCL 2 MG/2ML IJ SOLN
INTRAMUSCULAR | Status: AC
  Filled 2017-11-04: qty 2

## 2017-11-04 MED ORDER — PROPOFOL 10 MG/ML IV BOLUS
INTRAVENOUS | Status: DC | PRN
  Administered 2017-11-04: 180 mg via INTRAVENOUS

## 2017-11-04 MED ORDER — CEFAZOLIN SODIUM-DEXTROSE 2-4 GM/100ML-% IV SOLN
INTRAVENOUS | Status: AC
  Filled 2017-11-04: qty 100

## 2017-11-04 SURGICAL SUPPLY — 33 items
BAG DRAIN CYSTO-URO LG1000N (MISCELLANEOUS) ×4 IMPLANT
BAG URINE DRAINAGE (UROLOGICAL SUPPLIES) ×4 IMPLANT
BRUSH SCRUB EZ  4% CHG (MISCELLANEOUS) ×2
BRUSH SCRUB EZ 4% CHG (MISCELLANEOUS) ×2 IMPLANT
CATH FOLEY 2WAY  5CC 16FR (CATHETERS) ×2
CATH URETL 5X70 OPEN END (CATHETERS) ×4 IMPLANT
CATH URTH 16FR FL 2W BLN LF (CATHETERS) ×2 IMPLANT
CONRAY 43 FOR UROLOGY 50M (MISCELLANEOUS) ×4 IMPLANT
DRAPE UTILITY 15X26 TOWEL STRL (DRAPES) ×4 IMPLANT
DRSG TELFA 4X3 1S NADH ST (GAUZE/BANDAGES/DRESSINGS) ×4 IMPLANT
ELECT LOOP 22F BIPOLAR SML (ELECTROSURGICAL)
ELECT REM PT RETURN 9FT ADLT (ELECTROSURGICAL) ×4
ELECTRODE LOOP 22F BIPOLAR SML (ELECTROSURGICAL) IMPLANT
ELECTRODE REM PT RTRN 9FT ADLT (ELECTROSURGICAL) ×2 IMPLANT
GLOVE BIO SURGEON STRL SZ 6.5 (GLOVE) ×3 IMPLANT
GLOVE BIO SURGEONS STRL SZ 6.5 (GLOVE) ×1
GOWN STRL REUS W/ TWL LRG LVL3 (GOWN DISPOSABLE) ×4 IMPLANT
GOWN STRL REUS W/TWL LRG LVL3 (GOWN DISPOSABLE) ×4
KIT TURNOVER CYSTO (KITS) ×4 IMPLANT
LOOP CUT BIPOLAR 24F LRG (ELECTROSURGICAL) IMPLANT
NDL SAFETY ECLIPSE 18X1.5 (NEEDLE) IMPLANT
NEEDLE HYPO 18GX1.5 SHARP (NEEDLE)
PACK CYSTO AR (MISCELLANEOUS) ×4 IMPLANT
SENSORWIRE 0.038 NOT ANGLED (WIRE) ×4
SET CYSTO W/LG BORE CLAMP LF (SET/KITS/TRAYS/PACK) ×4 IMPLANT
SET IRRIG Y TYPE TUR BLADDER L (SET/KITS/TRAYS/PACK) ×4 IMPLANT
SET IRRIGATING DISP (SET/KITS/TRAYS/PACK) ×4 IMPLANT
SOL .9 NS 3000ML IRR  AL (IV SOLUTION) ×2
SOL .9 NS 3000ML IRR UROMATIC (IV SOLUTION) ×2 IMPLANT
SURGILUBE 2OZ TUBE FLIPTOP (MISCELLANEOUS) ×4 IMPLANT
SYRINGE IRR TOOMEY STRL 70CC (SYRINGE) ×4 IMPLANT
WATER STERILE IRR 1000ML POUR (IV SOLUTION) ×4 IMPLANT
WIRE SENSOR 0.038 NOT ANGLED (WIRE) ×2 IMPLANT

## 2017-11-04 NOTE — Discharge Instructions (Signed)
Cystoscopy Cystoscopy is a procedure that is used to help diagnose and sometimes treat conditions that affect that lower urinary tract. The lower urinary tract includes the bladder and the tube that drains urine from the bladder out of the body (urethra). Cystoscopy is performed with a thin, tube-shaped instrument with a light and camera at the end (cystoscope). The cystoscope may be hard (rigid) or flexible, depending on the goal of the procedure.The cystoscope is inserted through the urethra, into the bladder. Cystoscopy may be recommended if you have:  Urinary tractinfections that keep coming back (recurring).  Blood in the urine (hematuria).  Loss of bladder control (urinary incontinence) or an overactive bladder.  Unusual cells found in a urine sample.  A blockage in the urethra.  Painful urination.  An abnormality in the bladder found during an intravenous pyelogram (IVP) or CT scan.  Cystoscopy may also be done to remove a sample of tissue to be examined under a microscope (biopsy). Tell a health care provider about:  Any allergies you have.  All medicines you are taking, including vitamins, herbs, eye drops, creams, and over-the-counter medicines.  Any problems you or family members have had with anesthetic medicines.  Any blood disorders you have.  Any surgeries you have had.  Any medical conditions you have.  Whether you are pregnant or may be pregnant. What are the risks? Generally, this is a safe procedure. However, problems may occur, including:  Infection.  Bleeding.  Allergic reactions to medicines.  Damage to other structures or organs.  What happens before the procedure?  Ask your health care provider about: ? Changing or stopping your regular medicines. This is especially important if you are taking diabetes medicines or blood thinners. ? Taking medicines such as aspirin and ibuprofen. These medicines can thin your blood. Do not take these medicines  before your procedure if your health care provider instructs you not to.  Follow instructions from your health care provider about eating or drinking restrictions.  You may be given antibiotic medicine to help prevent infection.  You may have an exam or testing, such as X-rays of the bladder, urethra, or kidneys.  You may have urine tests to check for signs of infection.  Plan to have someone take you home after the procedure. What happens during the procedure?  To reduce your risk of infection,your health care team will wash or sanitize their hands.  You will be given one or more of the following: ? A medicine to help you relax (sedative). ? A medicine to numb the area (local anesthetic).  The area around the opening of your urethra will be cleaned.  The cystoscope will be passed through your urethra into your bladder.  Germ-free (sterile)fluid will flow through the cystoscope to fill your bladder. The fluid will stretch your bladder so that your surgeon can clearly examine your bladder walls.  The cystoscope will be removed and your bladder will be emptied. The procedure may vary among health care providers and hospitals. What happens after the procedure?  You may have some soreness or pain in your abdomen and urethra. Medicines will be available to help you.  You may have some blood in your urine.  Do not drive for 24 hours if you received a sedative. This information is not intended to replace advice given to you by your health care provider. Make sure you discuss any questions you have with your health care provider. Document Released: 03/21/2000 Document Revised: 08/02/2015 Document Reviewed: 02/08/2015 Elsevier Interactive   Patient Education  2018 [place]   1) The drugs that you were given will stay in your system until tomorrow so for the next 24 hours you should not:  A) Drive an automobile B) Make any legal  decisions C) Drink any alcoholic beverage   2) You may resume regular meals tomorrow.  Today it is better to start with liquids and gradually work up to solid foods.  You may eat anything you prefer, but it is better to start with liquids, then soup and crackers, and gradually work up to solid foods.   3) Please notify your doctor immediately if you have any unusual bleeding, trouble breathing, redness and pain at the surgery site, drainage, fever, or pain not relieved by medication.    4) Additional Instructions:        Please contact your physician with any problems or Same Day Surgery at [phone], Monday through Friday 6 am to 4 pm, or [place] at [facility] number at [phone].

## 2017-11-04 NOTE — Anesthesia Procedure Notes (Signed)
Procedure Name: LMA Insertion Date/Time: 11/04/2017 2:40 PM Performed by: Dionne Bucy, CRNA Pre-anesthesia Checklist: Patient identified, Patient being monitored, Timeout performed, Emergency Drugs available and Suction available Patient Re-evaluated:Patient Re-evaluated prior to induction Oxygen Delivery Method: Circle system utilized Preoxygenation: Pre-oxygenation with 100% oxygen Induction Type: IV induction Ventilation: Mask ventilation without difficulty LMA: LMA inserted LMA Size: 4.0 Tube type: Oral Number of attempts: 1 Placement Confirmation: positive ETCO2 and breath sounds checked- equal and bilateral Tube secured with: Tape Dental Injury: Teeth and Oropharynx as per pre-operative assessment

## 2017-11-04 NOTE — Anesthesia Preprocedure Evaluation (Signed)
Anesthesia Evaluation  Patient identified by MRN, date of birth, ID band Patient awake    Reviewed: Allergy & Precautions, H&P , NPO status , Patient's Chart, lab work & pertinent test results, reviewed documented beta blocker date and time   Airway Mallampati: III  TM Distance: >3 FB Neck ROM: full    Dental  (+) Teeth Intact   Pulmonary neg pulmonary ROS, asthma , sleep apnea and Continuous Positive Airway Pressure Ventilation ,    Pulmonary exam normal        Cardiovascular Exercise Tolerance: Poor hypertension, negative cardio ROS Normal cardiovascular exam+ dysrhythmias  Rate:Normal     Neuro/Psych PSYCHIATRIC DISORDERS negative neurological ROS  negative psych ROS   GI/Hepatic negative GI ROS, Neg liver ROS, GERD  Medicated,  Endo/Other  negative endocrine ROSHypothyroidism   Renal/GU CRFRenal diseasenegative Renal ROS  negative genitourinary   Musculoskeletal   Abdominal   Peds  Hematology negative hematology ROS (+)   Anesthesia Other Findings   Reproductive/Obstetrics negative OB ROS                             Anesthesia Physical Anesthesia Plan  ASA: III  Anesthesia Plan: General LMA   Post-op Pain Management:    Induction:   PONV Risk Score and Plan:   Airway Management Planned:   Additional Equipment:   Intra-op Plan:   Post-operative Plan:   Informed Consent: I have reviewed the patients History and Physical, chart, labs and discussed the procedure including the risks, benefits and alternatives for the proposed anesthesia with the patient or authorized representative who has indicated his/her understanding and acceptance.     Plan Discussed with: CRNA  Anesthesia Plan Comments:         Anesthesia Quick Evaluation

## 2017-11-04 NOTE — Interval H&P Note (Signed)
History and Physical Interval Note:  11/04/2017 2:02 PM  Alyssa Duncan  has presented today for surgery, with the diagnosis of bladder lesion  The various methods of treatment have been discussed with the patient and family. After consideration of risks, benefits and other options for treatment, the patient has consented to  Procedure(s): TRANSURETHRAL RESECTION OF BLADDER TUMOR WITH MITOMYCIN-C (Small) (N/A) CYSTOSCOPY WITH RETROGRADE PYELOGRAM (Bilateral) as a surgical intervention .  The patient's history has been reviewed, patient examined, no change in status, stable for surgery.  I have reviewed the patient's chart and labs.  Questions were answered to the patient's satisfaction.    RRR CTAB   Hollice Espy

## 2017-11-04 NOTE — OR Nursing (Signed)
Discharge instructions discussed with pt and husband. Both voice understanding. 

## 2017-11-04 NOTE — Transfer of Care (Signed)
Immediate Anesthesia Transfer of Care Note  Patient: Alyssa Duncan  Procedure(s) Performed: CYSTOSCOPY WITH RETROGRADE PYELOGRAM (Bilateral )  Patient Location: PACU  Anesthesia Type:General  Level of Consciousness: sedated  Airway & Oxygen Therapy: Patient Spontanous Breathing and Patient connected to face mask oxygen  Post-op Assessment: Report given to RN and Post -op Vital signs reviewed and stable  Post vital signs: Reviewed and stable  Last Vitals:  Vitals Value Taken Time  BP 158/70 11/04/2017  3:09 PM  Temp 36.3 C 11/04/2017  3:09 PM  Pulse 62 11/04/2017  3:12 PM  Resp 17 11/04/2017  3:12 PM  SpO2 99 % 11/04/2017  3:12 PM  Vitals shown include unvalidated device data.  Last Pain:  Vitals:   11/04/17 1509  TempSrc:   PainSc: Asleep         Complications: No apparent anesthesia complications

## 2017-11-04 NOTE — Op Note (Signed)
Date of procedure: 11/04/17  Preoperative diagnosis:  1. History of bladder cancer 2. Bladder lesion  Postoperative diagnosis:  1. History of bladder cancer 2. Normal cystoscopy  Procedure: 1. Cystoscopy 2. Bilateral retrograde pyelogram  Surgeon: Hollice Espy, MD  Anesthesia: General  Complications: None  Intraoperative findings: No bladder pathology identified.  Stellate scar and posterior right lateral bladder wall.  Normal bilateral retrograde pyelogram.  EBL: None  Specimens: None none  Drains: None  Indication: Alyssa Duncan is a 77 y.o. patient with personal history of bladder cancer found to have a suspicious area in the office to elected to come to the operating room for bladder biopsy.  After reviewing the management options for treatment, she elected to proceed with the above surgical procedure(s). We have discussed the potential benefits and risks of the procedure, side effects of the proposed treatment, the likelihood of the patient achieving the goals of the procedure, and any potential problems that might occur during the procedure or recuperation. Informed consent has been obtained.  Description of procedure:  The patient was taken to the operating room and general anesthesia was induced.  The patient was placed in the dorsal lithotomy position, prepped and draped in the usual sterile fashion, and preoperative antibiotics were administered. A preoperative time-out was performed.   A 21 French cystoscope was advanced per urethra into the bladder.  The bladder was carefully inspected and no obvious lesions or erythema was identified.  There is a stellate scar appreciated on the right posterior lateral bladder wall.  In the office, there is an area of erythema with subtle texture which is concerning for early recurrence.  Despite inspecting the bladder extremely carefully of variable degrees of distention, this area was not appreciated whatsoever.  The remainder of  the bladder was also completely normal.  Attention was turned to the left ureteral orifice was cannulated using a 5 [place] open-ended ureteral catheter.  Contrast was injected for gentle retrograde pyelogram which showed a delicate appearing ureter and no hydroureteronephrosis or filling defects.  The same procedure was performed on the right which was also negative for any hydronephrosis or filling defects.  Given that there was no areas of suspicion including no areas of erythema or papillary change, no bladder biopsies were performed.  The bladder was drained, the patient was clean and dry, repositioned in supine position, and taken to back in stable condition.  Plan: We will continue in office surveillance in 3 months.  I discussed findings with both the patient and her husband.  Suspect this may have been related to a focal inflammatory area which is resolved or possibly even scope trauma in the office.  Area of concern was not appreciated today in the operating room.  Hollice Espy, M.D.

## 2017-11-04 NOTE — Anesthesia Post-op Follow-up Note (Signed)
Anesthesia QCDR form completed.        

## 2017-11-04 NOTE — OR Nursing (Signed)
Dr. Andree Elk informed of patient's K+ of 3.2.

## 2017-11-06 NOTE — Anesthesia Postprocedure Evaluation (Signed)
Anesthesia Post Note  Patient: Alyssa Duncan  Procedure(s) Performed: CYSTOSCOPY WITH RETROGRADE PYELOGRAM (Bilateral )  Patient location during evaluation: PACU Anesthesia Type: General Level of consciousness: awake and alert Pain management: pain level controlled Vital Signs Assessment: post-procedure vital signs reviewed and stable Respiratory status: spontaneous breathing, nonlabored ventilation, respiratory function stable and patient connected to nasal cannula oxygen Cardiovascular status: blood pressure returned to baseline and stable Postop Assessment: no apparent nausea or vomiting Anesthetic complications: no     Last Vitals:  Vitals:   11/04/17 1545 11/04/17 1625  BP: (!) 172/74 (!) 170/72  Pulse: 63   Resp: 18   Temp:    SpO2: 98%     Last Pain:  Vitals:   11/05/17 0837  TempSrc:   PainSc: 0-No pain                 Molli Barrows

## 2017-11-24 ENCOUNTER — Other Ambulatory Visit: Payer: Self-pay | Admitting: Urgent Care

## 2017-12-15 ENCOUNTER — Inpatient Hospital Stay (HOSPITAL_BASED_OUTPATIENT_CLINIC_OR_DEPARTMENT_OTHER): Payer: Medicare Other | Admitting: Hematology and Oncology

## 2017-12-15 ENCOUNTER — Other Ambulatory Visit: Payer: Self-pay | Admitting: Hematology and Oncology

## 2017-12-15 ENCOUNTER — Encounter: Payer: Self-pay | Admitting: Hematology and Oncology

## 2017-12-15 ENCOUNTER — Inpatient Hospital Stay: Payer: Medicare Other | Attending: Hematology and Oncology

## 2017-12-15 VITALS — BP 162/83 | HR 61 | Temp 98.2°F | Resp 18 | Wt 203.2 lb

## 2017-12-15 DIAGNOSIS — D0512 Intraductal carcinoma in situ of left breast: Secondary | ICD-10-CM

## 2017-12-15 DIAGNOSIS — Z8551 Personal history of malignant neoplasm of bladder: Secondary | ICD-10-CM | POA: Insufficient documentation

## 2017-12-15 LAB — COMPREHENSIVE METABOLIC PANEL
ALT: 41 U/L (ref 0–44)
AST: 42 U/L — ABNORMAL HIGH (ref 15–41)
Albumin: 3.9 g/dL (ref 3.5–5.0)
Alkaline Phosphatase: 88 U/L (ref 38–126)
Anion gap: 13 (ref 5–15)
BUN: 17 mg/dL (ref 8–23)
CO2: 28 mmol/L (ref 22–32)
Calcium: 9.2 mg/dL (ref 8.9–10.3)
Chloride: 99 mmol/L (ref 98–111)
Creatinine, Ser: 1.09 mg/dL — ABNORMAL HIGH (ref 0.44–1.00)
GFR calc Af Amer: 54 mL/min — ABNORMAL LOW (ref >60)
GFR calc non Af Amer: 47 mL/min — ABNORMAL LOW (ref >60)
Glucose, Bld: 153 mg/dL — ABNORMAL HIGH (ref 70–99)
Potassium: 3.3 mmol/L — ABNORMAL LOW (ref 3.5–5.1)
Sodium: 140 mmol/L (ref 135–145)
Total Bilirubin: 0.6 mg/dL (ref 0.3–1.2)
Total Protein: 7.1 g/dL (ref 6.5–8.1)

## 2017-12-15 LAB — CBC WITH DIFFERENTIAL/PLATELET
Basophils Absolute: 0 10*3/uL (ref 0–0.1)
Basophils Relative: 0 %
Eosinophils Absolute: 0.4 10*3/uL (ref 0–0.7)
Eosinophils Relative: 6 %
HCT: 44.4 % (ref 35.0–47.0)
Hemoglobin: 14.8 g/dL (ref 12.0–16.0)
Lymphocytes Relative: 20 %
Lymphs Abs: 1.4 10*3/uL (ref 1.0–3.6)
MCH: 30.5 pg (ref 26.0–34.0)
MCHC: 33.4 g/dL (ref 32.0–36.0)
MCV: 91.4 fL (ref 80.0–100.0)
Monocytes Absolute: 0.5 10*3/uL (ref 0.2–0.9)
Monocytes Relative: 7 %
Neutro Abs: 4.8 10*3/uL (ref 1.4–6.5)
Neutrophils Relative %: 67 %
Platelets: 264 10*3/uL (ref 150–440)
RBC: 4.86 MIL/uL (ref 3.80–5.20)
RDW: 13.9 % (ref 11.5–14.5)
WBC: 7.2 10*3/uL (ref 3.6–11.0)

## 2017-12-15 NOTE — Progress Notes (Signed)
Patient offers no complaints today. 

## 2017-12-15 NOTE — Progress Notes (Signed)
[facility]-  Cancer Center  Clinic day:  12/15/2017  Chief Complaint: Alyssa Duncan is a 77 y.o. 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  HPI:  The patient was last seen in the medical oncology clinic on 06/16/2017.  At that time, she felt "good".  Exam revealed post-operative and post-radiation changes in the left breast.  CBC and CMP were unremarkable.   Bone density on 08/25/2017 was normal with a T-score of -0.8 in the femoral neck.  During the interim, she has felt "fine".  She denies any breast concerns.  She has continued on Arimidex.  She has not missed any doses.  She has 10 pills left in her bottle.   Past Medical History:  Diagnosis Date  . Acute biliary pancreatitis   . Arthritis   . Asthma   . Back pain   . Bladder cancer (HCC)   . Breast cancer (HCC) 2013   left breast ductal and lobular carcinoma  . Choledocholithiasis   . Difficult intubation   . Dysrhythmia 12/2015   A-fib  . Family history of adverse reaction to anesthesia    dad had hard time waking up from anesthesia  . Gallstone pancreatitis 05/13/2017  . GERD (gastroesophageal reflux disease)   . History of kidney stones   . Hyperlipidemia   . Hypertension   . Hypothyroidism   . Kidney stones 12/2015  . Personal history of radiation therapy 2013   BREAST CA  . Shingles   . Skin cancer 2005   left arm and nose  . Sleep apnea     Past Surgical History:  Procedure Laterality Date  . BREAST BIOPSY Left 2013   POS  . BREAST LUMPECTOMY Left 2013   with rad tx  . CARPAL TUNNEL RELEASE    . CATARACT EXTRACTION    . CHOLECYSTECTOMY N/A 05/15/2017   Procedure: LAPAROSCOPIC CHOLECYSTECTOMY WITH INTRAOPERATIVE CHOLANGIOGRAM;  Surgeon: Davis, Jason Evan, MD;  Location: [facility] ORS;  Service: General;  Laterality: N/A;  . COLONOSCOPY WITH PROPOFOL N/A 05/10/2015   Procedure: COLONOSCOPY WITH PROPOFOL;  Surgeon: Paul Y Oh, MD;  Location: [facility] ENDOSCOPY;  Service:  Gastroenterology;  Laterality: N/A;  . CYSTOSCOPY W/ RETROGRADES Bilateral 01/02/2016   Procedure: CYSTOSCOPY WITH RETROGRADE PYELOGRAM;  Surgeon: Ashley Brandon, MD;  Location: [facility] ORS;  Service: Urology;  Laterality: Bilateral;  . CYSTOSCOPY W/ RETROGRADES Bilateral 11/04/2017   Procedure: CYSTOSCOPY WITH RETROGRADE PYELOGRAM;  Surgeon: Brandon, Ashley, MD;  Location: [facility] ORS;  Service: Urology;  Laterality: Bilateral;  . CYSTOSCOPY W/ URETERAL STENT PLACEMENT Right 01/02/2016   Procedure: CYSTOSCOPY WITH STENT REPLACEMENT;  Surgeon: Ashley Brandon, MD;  Location: [facility] ORS;  Service: Urology;  Laterality: Right;  . CYSTOSCOPY WITH STENT PLACEMENT Right 12/04/2015   Procedure: CYSTOSCOPY WITH STENT PLACEMENT;  Surgeon: Ashley Brandon, MD;  Location: [facility] ORS;  Service: Urology;  Laterality: Right;  . ENDOSCOPIC RETROGRADE CHOLANGIOPANCREATOGRAPHY (ERCP) WITH PROPOFOL N/A 05/14/2017   Procedure: ENDOSCOPIC RETROGRADE CHOLANGIOPANCREATOGRAPHY (ERCP) WITH PROPOFOL;  Surgeon: Wohl, Darren, MD;  Location: [facility] ENDOSCOPY;  Service: Endoscopy;  Laterality: N/A;  . JOINT REPLACEMENT Left 2013   hip  . KNEE ARTHROSCOPY    . SEPTOPLASTY    . TRANSURETHRAL RESECTION OF BLADDER TUMOR WITH MITOMYCIN-C N/A 01/02/2016   Procedure: TRANSURETHRAL RESECTION OF BLADDER TUMOR WITH MITOMYCIN-C;  Surgeon: Ashley Brandon, MD;  Location: [facility] ORS;  Service: Urology;  Laterality: N/A;  . URETEROSCOPY WITH HOLMIUM LASER LITHOTRIPSY Right 01/02/2016   Procedure:   URETEROSCOPY WITH HOLMIUM LASER LITHOTRIPSY;  Surgeon: Ashley Brandon, MD;  Location: [facility] ORS;  Service: Urology;  Laterality: Right;  . UVULOPALATOPHARYNGOPLASTY     and tongue surgery    Family History  Problem Relation Age of Onset  . Breast cancer Mother 56  . Kidney cancer Father   . Kidney Stones Brother   . Prostate cancer Neg Hx     Social History:  reports that she has never smoked. She has never used smokeless tobacco. She reports that she does not  drink alcohol or use drugs.  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  She lives in [place].  The patient is alone today.  Allergies:  Allergies  Allergen Reactions  . Ativan [Lorazepam] Other (See Comments)    Hysteria   . Cardura [Doxazosin] Other (See Comments)    unsure  . Clonidine Derivatives Other (See Comments)    unsure  . Enalapril Other (See Comments)    unsure  . Levaquin [Levofloxacin] Other (See Comments)    Couldn't raise her arms  . Mobic [Meloxicam] Other (See Comments)    unsure  . Sulfa Antibiotics Nausea Only  . Vicodin [Hydrocodone-Acetaminophen] Other (See Comments)    unsure    Current Medications: Current Outpatient Medications  Medication Sig Dispense Refill  . amLODipine (NORVASC) 5 MG tablet Take 5 mg by mouth every morning.     . anastrozole (ARIMIDEX) 1 MG tablet TAKE 1 TABLET BY MOUTH DAILY 30 tablet 3  . aspirin EC 81 MG tablet Take 1 tablet (81 mg total) by mouth daily. 90 tablet 3  . Calcium Carb-Cholecalciferol (CALCIUM 600+D3) 600-800 MG-UNIT TABS Take 1 tablet by mouth 2 (two) times daily.    . docusate sodium (COLACE) 50 MG capsule Take 50 mg by mouth daily.    . dorzolamide-timolol (COSOPT) 22.3-6.8 MG/ML ophthalmic solution Place 1 drop into both eyes 2 (two) times daily.     . furosemide (LASIX) 20 MG tablet Take 20 mg by mouth daily.    . hydrochlorothiazide (HYDRODIURIL) 25 MG tablet Take 25 mg by mouth daily.    . ibuprofen (ADVIL,MOTRIN) 200 MG tablet Take 400-600 mg by mouth every 6 (six) hours as needed for headache or moderate pain.     . levothyroxine (SYNTHROID, LEVOTHROID) 75 MCG tablet Take 75 mcg by mouth daily before breakfast.    . loratadine (CLARITIN) 10 MG tablet Take 10 mg by mouth daily.    . losartan (COZAAR) 100 MG tablet Take 100 mg by mouth every morning.     . lovastatin (MEVACOR) 40 MG tablet Take 40 mg by mouth at bedtime.    . LUMIGAN 0.01 % SOLN Place 1 drop into both eyes at bedtime.     . metoprolol tartrate  (LOPRESSOR) 100 MG tablet Take 100 mg by mouth 2 (two) times daily.    . pantoprazole (PROTONIX) 40 MG tablet Take 40 mg by mouth at bedtime.     . potassium chloride SA (K-DUR,KLOR-CON) 20 MEQ tablet Take 20 mEq by mouth 2 (two) times daily.    . vitamin B-12 (CYANOCOBALAMIN) 1000 MCG tablet Take 1,000 mcg by mouth daily.    . vitamin C (ASCORBIC ACID) 500 MG tablet Take 500 mg by mouth daily.     No current facility-administered medications for this visit.     Review of Systems:  GENERAL:  Feels "fine".  No fevers, sweats.  Weight down 4 pounds. PERFORMANCE STATUS (ECOG): 0 HEENT:  No visual changes, runny nose, sore throat,   mouth sores or tenderness. Lungs: No shortness of breath or cough.  No hemoptysis. Cardiac:  No chest pain, palpitations, orthopnea, or PND. GI:  No nausea, vomiting, diarrhea, constipation, melena or hematochezia. GU:  No urgency, frequency, dysuria, or hematuria. Musculoskeletal:  No back pain.  Knee still aches.  No muscle tenderness. Extremities:  No pain or swelling. Skin:  No rashes or skin changes. Neuro:  No headache, numbness or weakness, balance or coordination issues. Endocrine:  No diabetes, thyroid issues, hot flashes or night sweats. Psych:  No mood changes, depression or anxiety. Pain:  No focal pain. Review of systems:  All other systems reviewed and found to be negative.   Physical Exam: Blood pressure (!) 162/83, pulse 61, temperature 98.2 F (36.8 C), temperature source Tympanic, resp. rate 18, weight 203 lb 4 oz (92.2 kg). GENERAL:  Well developed, well nourished, woman sitting comfortably in the exam room in no acute distress. MENTAL STATUS:  Alert and oriented to person, place and time. HEAD:  Brown hair.  Normocephalic, atraumatic, face symmetric, no Cushingoid features. EYES:  Glasses.  Blue eyes.  Pupils equal round and reactive to light and accomodation.  No conjunctivitis or scleral icterus. ENT:  Oropharynx clear without lesion.   Tongue normal. Mucous membranes moist.  RESPIRATORY:  Clear to auscultation without rales, wheezes or rhonchi. CARDIOVASCULAR:  Regular rate and rhythm without murmur, rub or gallop. BREAST:  Right breast without masses, skin changes or nipple discharge.  Inverted nipple.  Left breast with post-operative and post-radiation changes, stable.  No masses, skin changes or nipple discharge. ABDOMEN:  Soft, non-tender, with active bowel sounds, and no hepatosplenomegaly.  No masses. SKIN:  No rashes, ulcers or lesions. EXTREMITIES: No edema, no skin discoloration or tenderness.  No palpable cords. LYMPH NODES: No palpable cervical, supraclavicular, axillary or inguinal adenopathy  NEUROLOGICAL: Unremarkable. PSYCH:  Appropriate.    Appointment on 12/15/2017  Component Date Value Ref Range Status  . Sodium 12/15/2017 140  135 - 145 mmol/L Final  . Potassium 12/15/2017 3.3* 3.5 - 5.1 mmol/L Final  . Chloride 12/15/2017 99  98 - 111 mmol/L Final  . CO2 12/15/2017 28  22 - 32 mmol/L Final  . Glucose, Bld 12/15/2017 153* 70 - 99 mg/dL Final  . BUN 12/15/2017 17  8 - 23 mg/dL Final  . Creatinine, Ser 12/15/2017 1.09* 0.44 - 1.00 mg/dL Final  . Calcium 12/15/2017 9.2  8.9 - 10.3 mg/dL Final  . Total Protein 12/15/2017 7.1  6.5 - 8.1 g/dL Final  . Albumin 12/15/2017 3.9  3.5 - 5.0 g/dL Final  . AST 12/15/2017 42* 15 - 41 U/L Final  . ALT 12/15/2017 41  0 - 44 U/L Final  . Alkaline Phosphatase 12/15/2017 88  38 - 126 U/L Final  . Total Bilirubin 12/15/2017 0.6  0.3 - 1.2 mg/dL Final  . GFR calc non Af Amer 12/15/2017 47* >60 mL/min Final  . GFR calc Af Amer 12/15/2017 54* >60 mL/min Final   Comment: (NOTE) The eGFR has been calculated using the CKD EPI equation. This calculation has not been validated in all clinical situations. eGFR's persistently <60 mL/min signify possible Chronic Kidney Disease.   Georgiann Hahn gap 12/15/2017 13  5 - 15 Final   Performed at [facility], [address] [place], [place] [address]  . WBC 12/15/2017 7.2  3.6 - 11.0 K/uL Final  . RBC 12/15/2017 4.86  3.80 - 5.20 MIL/uL Final  . Hemoglobin 12/15/2017 14.8  12.0 -  16.0 g/dL Final  . HCT 12/15/2017 44.4  35.0 - 47.0 % Final  . MCV 12/15/2017 91.4  80.0 - 100.0 fL Final  . MCH 12/15/2017 30.5  26.0 - 34.0 pg Final  . MCHC 12/15/2017 33.4  32.0 - 36.0 g/dL Final  . RDW 12/15/2017 13.9  11.5 - 14.5 % Final  . Platelets 12/15/2017 264  150 - 440 K/uL Final  . Neutrophils Relative % 12/15/2017 67  % Final  . Neutro Abs 12/15/2017 4.8  1.4 - 6.5 K/uL Final  . Lymphocytes Relative 12/15/2017 20  % Final  . Lymphs Abs 12/15/2017 1.4  1.0 - 3.6 K/uL Final  . Monocytes Relative 12/15/2017 7  % Final  . Monocytes Absolute 12/15/2017 0.5  0.2 - 0.9 K/uL Final  . Eosinophils Relative 12/15/2017 6  % Final  . Eosinophils Absolute 12/15/2017 0.4  0 - 0.7 K/uL Final  . Basophils Relative 12/15/2017 0  % Final  . Basophils Absolute 12/15/2017 0.0  0 - 0.1 K/uL Final   Performed at [facility], [address] [place], [place] [address]    Assessment:  Alyssa Duncan is a 77 y.o. 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  Pathology revealed a 3.6 cm area of grade II ductal carcinoma in situ. Margins were clear but close at 1 mm.  DCIS was ER + (> 90%), PR + (> 90%).  Pathologic stage was TisNx.  She received 5000 cGy to her left breast and boost scar another 1600 cGy using electron beam based on the close margin.  She completed radiation in 06/2012.    She was initially treated with Femara, but discontinued secondary to joint pains.  She tried tamoxifen, but discontinued secondary to swelling.  She began Aromasin since 11/16/2012.  She is tolerating Arimidex well.  Bilateral diagnostic mammogram on 03/24/2016 revealed no evidence of malignancy.  Diagnostic left mammogram and ultrasound on 06/09/2016 revealed an irregular hypoechogenicity in the left breast at 6 o'clock 2 cm  from the nipple measuring 9 x 7 x 10 mm. This was felt likely to represent post treatment change but underlying malignancy could not be excluded.  Ultrasound of the left axilla revealed no enlarged adenopathy.   Ultrasound guided biopsy on 06/19/2016 revealed changes c/w organizing fat necrosis.  Bilateral diagnostic mammogram on 03/13/2017 that revealed a suspicious 6 mm mass in the superficial upper outer left breast.  Biopsy on 03/24/2017 results revealed fat necrosis with dystrophic calcification.   Bone density on 08/22/2015 was normal with a T-score of -1.0 in the left femur.  Bone density on 08/25/2017 was normal with a T-score of -0.8 in the femoral neck.  She has a history of non-muscle invasive bladder cancer.  She underwent TURBT on 01/02/2016.  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  Cystoscopy on 05/01/2016 revealed a very small low-grade appearing recurrence.  She underwent cystoscopy and fulguration on 05/06/2016.  Cystoscopy on 04/29/2017 revealed no evidence of disease.  Next cystoscopy planned for 07/28/2017.  Symptomatically, she is doing well.  Exam is stable.  Plan: 1.  Labs today:  CBC with diff, CMP. 2.  Review interval bone density- normal. 3.  Left breast DCIS:  She has completed 5 years of endocrine therapy.  Discuss discontinuation of Arimidex after current bottle complete.  Bilateral mammogram 03/15/2018. 4.  RTC in 1 year for MD assessment and labs (CBC with diff, CMP).   Melissa C Corcoran, MD  12/15/2017, 8:12   PM  

## 2018-01-27 ENCOUNTER — Ambulatory Visit (INDEPENDENT_AMBULATORY_CARE_PROVIDER_SITE_OTHER): Payer: Medicare Other | Admitting: Urology

## 2018-01-27 ENCOUNTER — Encounter: Payer: Self-pay | Admitting: Urology

## 2018-01-27 ENCOUNTER — Other Ambulatory Visit: Payer: Self-pay

## 2018-01-27 VITALS — BP 186/78 | HR 68 | Ht 62.0 in | Wt 205.6 lb

## 2018-01-27 DIAGNOSIS — C679 Malignant neoplasm of bladder, unspecified: Secondary | ICD-10-CM | POA: Diagnosis not present

## 2018-01-27 LAB — URINALYSIS, COMPLETE
Bilirubin, UA: NEGATIVE
Glucose, UA: NEGATIVE
Ketones, UA: NEGATIVE
NITRITE UA: NEGATIVE
PH UA: 7 (ref 5.0–7.5)
Protein, UA: NEGATIVE
Specific Gravity, UA: 1.015 (ref 1.005–1.030)
Urobilinogen, Ur: 0.2 mg/dL (ref 0.2–1.0)

## 2018-01-27 LAB — MICROSCOPIC EXAMINATION

## 2018-01-27 NOTE — Progress Notes (Signed)
   01/27/18  CC:  Chief Complaint  Patient presents with  . Cysto    HPI: 77 year old female admitted on 12/03/2015 with fevers, right flank pain found to have an obstructing right distal 5 mm ureteral stone. She was taken to the operating room for cystoscopy, right ureteral stent placement in the setting of worsening leukocytosis and uncontrolled pain.  Intraoperatively, an incidental bladder tumor was identified.    She returned to the operating room on 01/02/2016 for definitive management of her stone via ureteroscopy, laser lithotripsy followed by TURBT. 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  Cysto in office on 05/01/2016 showed a very small low-grade appearing recurrence ~2 mm, anterior bladder wall in close proximity to the bladder neck. She underwent cystoscopy, fulguration of this lesion in the office shortly thereafter which was well tolerated.    Patient recently had concern for possible early recurrence on 10/2017 with some subtle carpeting around the stellate scar as well as focal area of erythema.  She was taken to the operating room shortly thereafter at which time no pathology was identified.  Bilateral retrogrades were unremarkable.  She returns today for routine office cystoscopy.  Vitals:   01/27/18 0844  BP: (!) 186/78  Pulse: 68   Height 5\' 2"  (1.575 m), weight 203 lb (92.1 kg). NED. A&Ox3.   No respiratory distress   Abd soft, NT, ND Normal external genitalia with patent urethral meatus  Cystoscopy/ Bladder lesion fulgeration procedure Note  Patient identification was confirmed, informed consent was obtained, and patient was prepped using Betadine solution.  Lidocaine jelly was instilled into the bladder and allowed to dwell for 30 minutes.  Preoperative abx where received prior to procedure.    Procedure: - Flexible cystoscope introduced, without any  difficulty.   - Thorough search of the bladder revealed:    normal urethral meatus    normal urothelium with subtle slightly erythema adjacent to stellate scar on the right posterior lateral bladder wall.  All amount of inflammatory/texture arise change on right bladder neck, disappears with bladder distention.    no ulcers     no urethral polyps    no trabeculation  - Ureteral orifices were normal in position and appearance.  Retroflexion showed no evidence of bladder tumor and bladder neck  Post-Procedure: - Patient tolerated the procedure well  Assessment/ Plan:  1. 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) H/o incidental bladder tumor, LgTa 1 cm s/p TURBT 12/2015 Small low-grade appearing recurrence 04/2016 Return to the operating room with concern for recurrence, not appreciated in the operating room Cystoscopy today with some subtle nonspecific inflammatory type changes, will continue to follow closely Plan for repeat cystoscopy in 3 months - ciprofloxacin (CIPRO) tablet 500 mg; Take 1 tablet (500 mg total) by mouth once. - lidocaine (XYLOCAINE) 2 % jelly 1 application; Place 1 application into the urethra once.  2. History of kidney stones Currently asymptomatic.  RUS 07/2017 negative  Hollice Espy, MD

## 2018-02-13 IMAGING — CT CT ABD-PELV W/ CM
3 of 6 series · 15 of 46 positions shown, 17 images · IV contrast (APPLIED)
Comparison: None.

CLINICAL DATA: RIGHT lower quadrant pain worsening for 2 days,
vomiting. Assess for bowel obstruction or appendicitis. History of
breast cancer, diabetes, and hip repair.

EXAM:
CT ABDOMEN AND PELVIS WITH CONTRAST
TECHNIQUE: Multidetector CT imaging of the abdomen and pelvis was performed
using the standard protocol following bolus administration of
intravenous contrast.
CONTRAST:  75mL 3IVHB4-CUU IOPAMIDOL (3IVHB4-CUU) INJECTION 61%

[Series 2: axial st · axial · 0.87mm/px · z∈[-468,-68]mm · 10 of 98 slices shown, 12 images]
[im 9/98  soft-tissue]
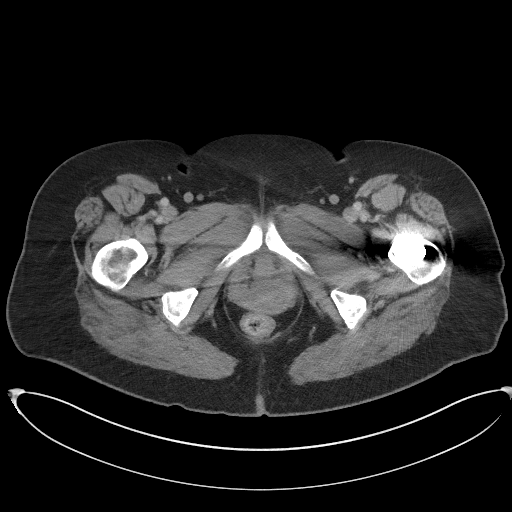
[im 9/98  bone]
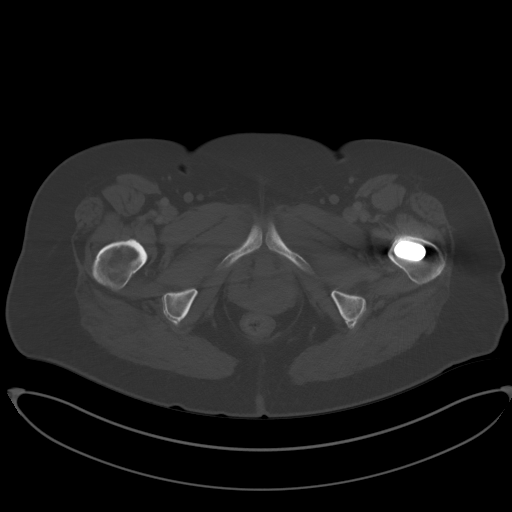
[im 18/98  soft-tissue]
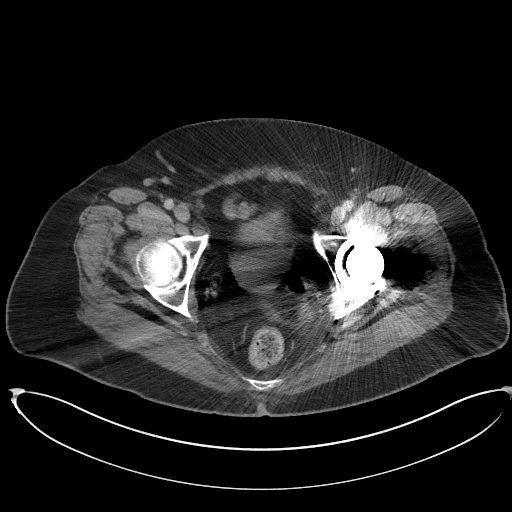
[im 27/98  soft-tissue]
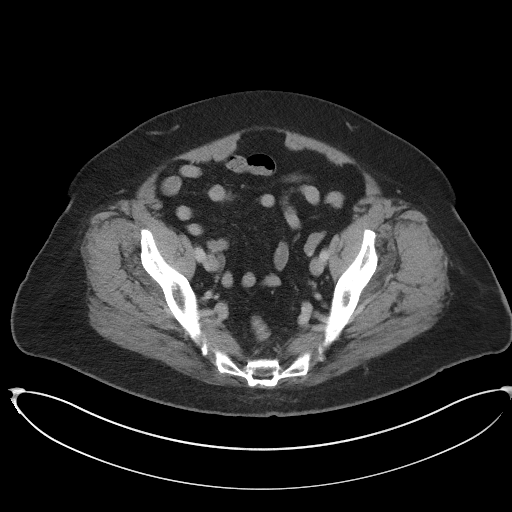
[im 36/98  soft-tissue]
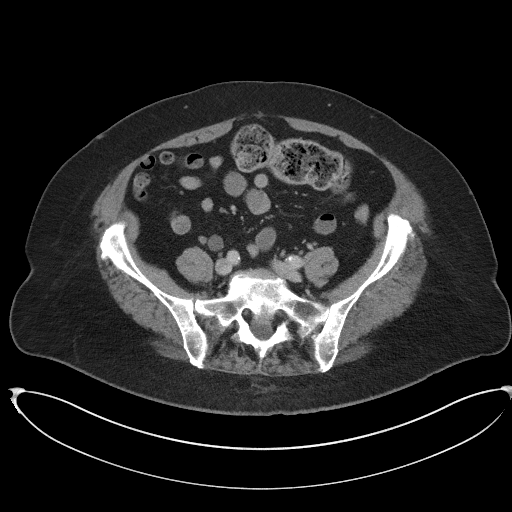
[im 45/98  soft-tissue]
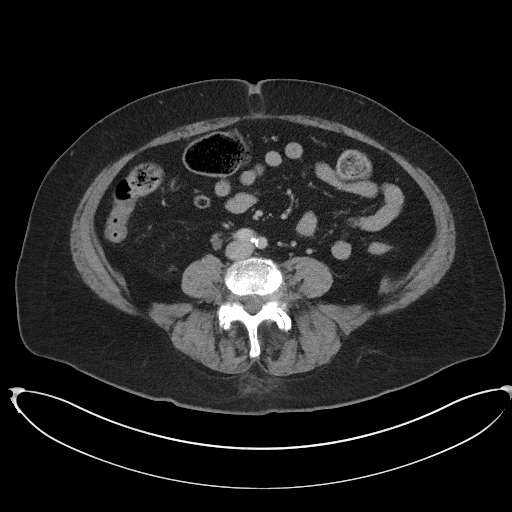
[im 53/98  soft-tissue]
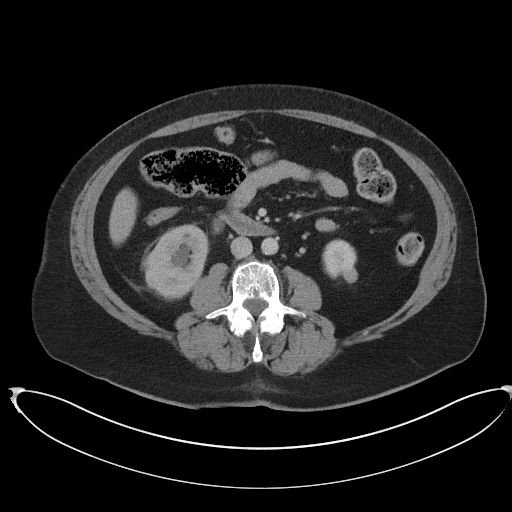
[im 62/98  soft-tissue]
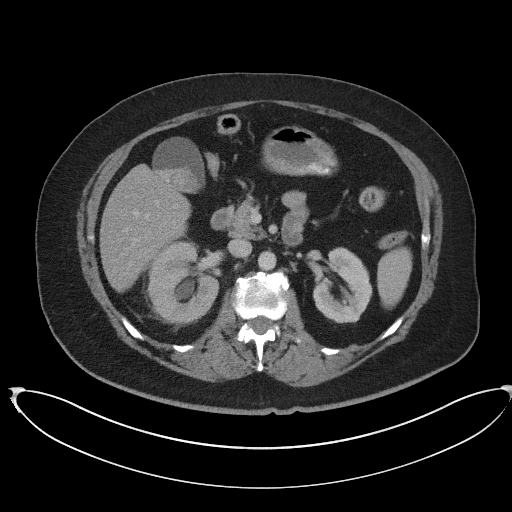
[im 71/98  soft-tissue]
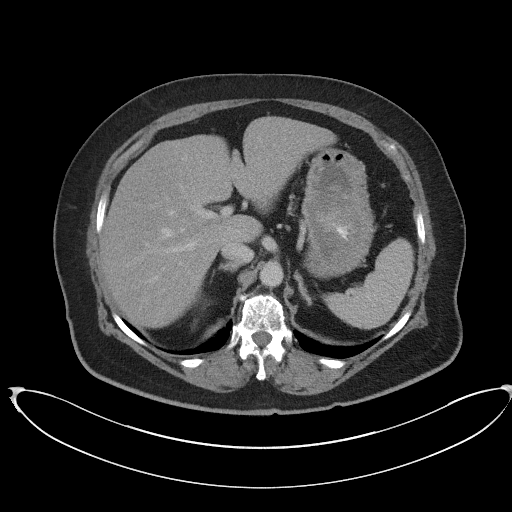
[im 80/98  soft-tissue]
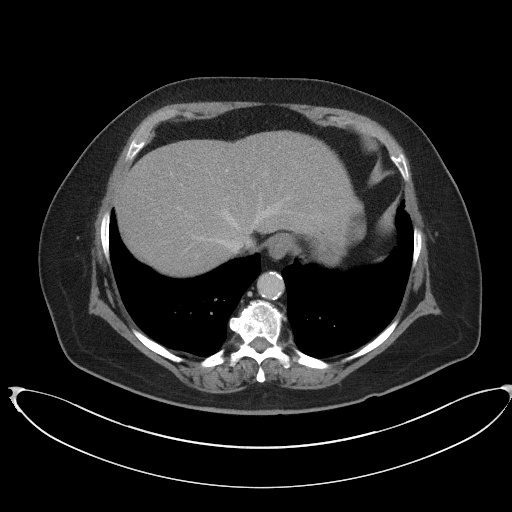
[im 80/98  bone]
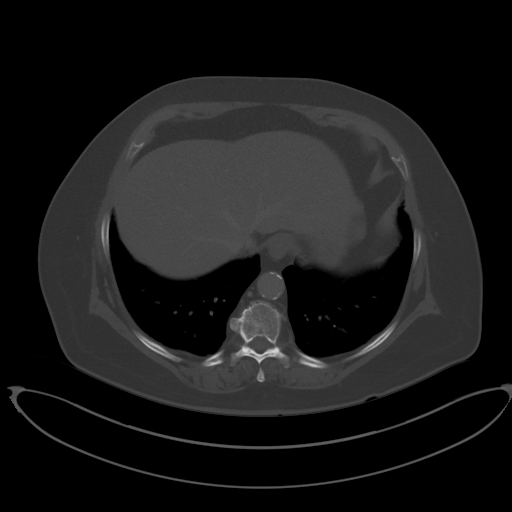
[im 89/98  soft-tissue]
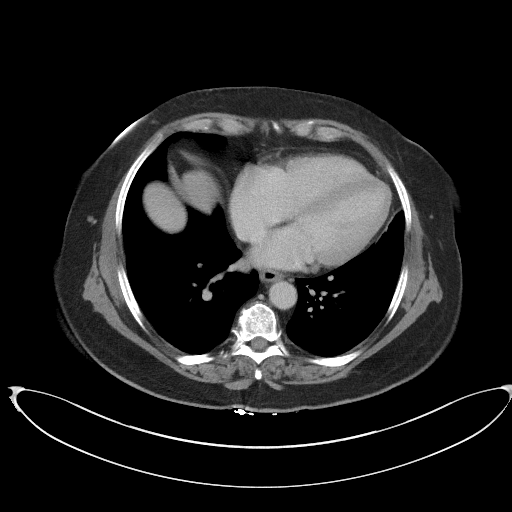

[Series 4: lung bases · axial · 0.87mm/px · z∈[-143,-83]mm · 2 of 36 slices shown]
[im 12/36  bone]
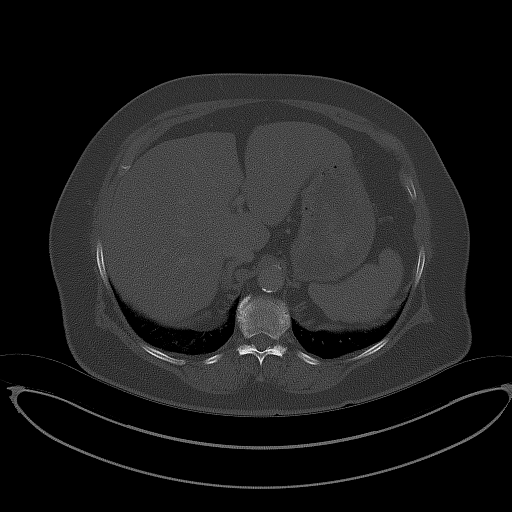
[im 24/36  bone]
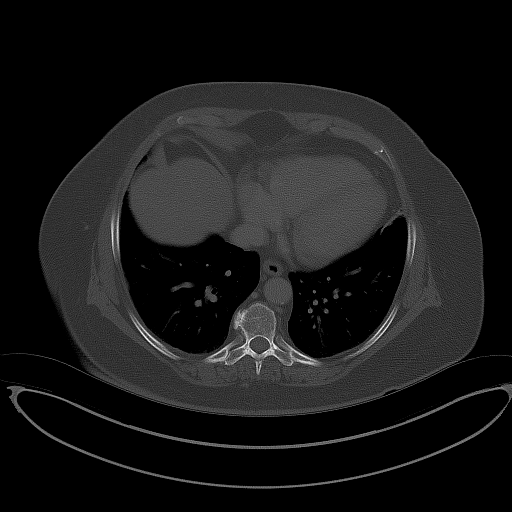

[Series 5: coronal st · coronal · 0.77mm/px · 3 of 94 slices shown]
[im 32/94  soft-tissue]
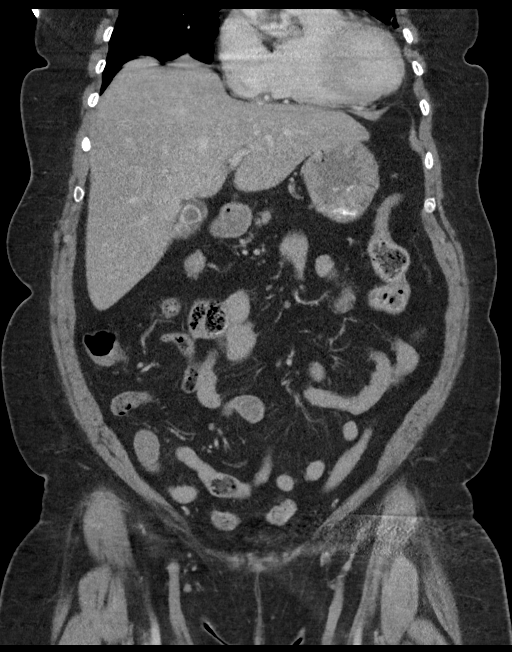
[im 42/94  soft-tissue]
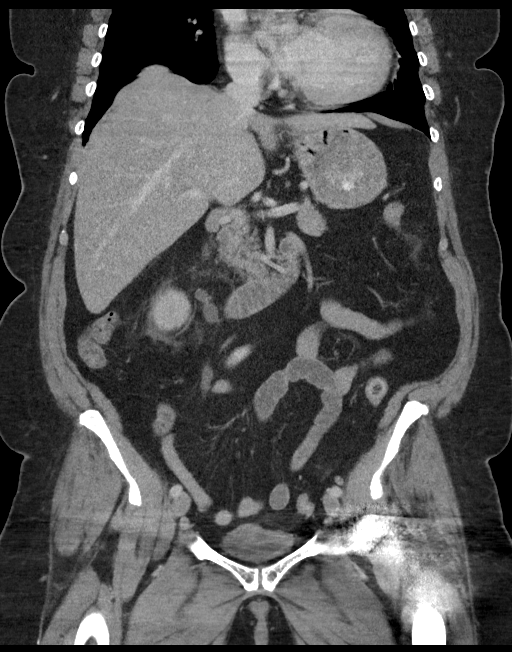
[im 52/94  soft-tissue]
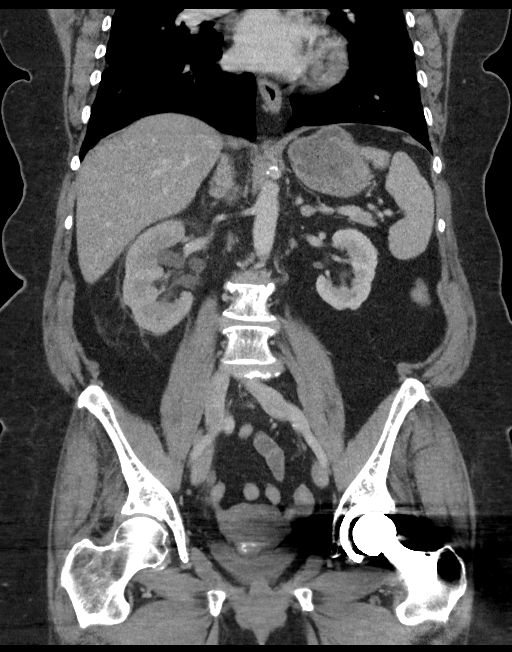

[15 of 46 positions shown; findings below may reference images not displayed]

FINDINGS: LUNG BASES: Included view of the lung bases are clear. Heart is
mildly enlarged. No pericardial effusions. Mild coronary artery
calcifications.

SOLID ORGANS: The liver, spleen, pancreas and adrenal glands are
unremarkable. Numerous gallstones measure up to 12 mm without CT
findings of acute cholecystitis.

GASTROINTESTINAL TRACT: The stomach, small and large bowel are
normal in course and caliber without inflammatory changes. Mild
amount of retained large bowel stool. The appendix is not discretely
identified, however there are no inflammatory changes in the right
lower quadrant.

KIDNEYS/ URINARY TRACT: Kidneys are orthotopic, RIGHT delayed
nephrogram. 3 mm RIGHT lower pole nephrolithiasis. Moderate RIGHT
hydroureteronephrosis the level of the distal ureter RIGHT 5 mm
calculus is present. The RIGHT ureter is decompressed distal to the
stone. No LEFT nephrolithiasis or hydronephrosis. 10 mm mildly
exophytic LEFT lower pole cyst. No renal masses. Prompt excretion
into the proximal LEFT ureteral collecting system. Urinary bladder
is decompressed.

PERITONEUM/RETROPERITONEUM: Aortoiliac vessels are normal in course
and caliber. No lymphadenopathy by CT size criteria. Internal
reproductive organs are unremarkable. No intraperitoneal free fluid
nor free air.

SOFT TISSUE/OSSEOUS STRUCTURES: Non-suspicious. Status post LEFT hip
total arthroplasty. LEFT breast scarring. Grade 1 L4-5
anterolisthesis without spondylolysis. L3-4 through L5-S1 moderate
to severe degenerative discs and facet arthropathy. Mild canal
stenosis L4-5. Severe L3-4 through L5-S1 neural foraminal narrowing.
Small fat containing umbilical hernia.
IMPRESSION: 1. 5 mm distal RIGHT ureteral calculus resulting in moderate
obstructive uropathy and mild RIGHT renal dysfunction. Residual 3 mm
RIGHT nephrolithiasis.
2. Cholelithiasis without CT findings of acute cholecystitis.

## 2018-03-15 ENCOUNTER — Other Ambulatory Visit: Payer: Self-pay | Admitting: Urgent Care

## 2018-03-15 ENCOUNTER — Ambulatory Visit
Admission: RE | Admit: 2018-03-15 | Discharge: 2018-03-15 | Disposition: A | Discharge status: Home / Self Care | Payer: Medicare Other | Source: Ambulatory Visit | Attending: Urgent Care | Admitting: Urgent Care

## 2018-03-15 DIAGNOSIS — N6320 Unspecified lump in the left breast, unspecified quadrant: Secondary | ICD-10-CM

## 2018-03-15 DIAGNOSIS — Z1231 Encounter for screening mammogram for malignant neoplasm of breast: Secondary | ICD-10-CM | POA: Diagnosis not present

## 2018-03-15 DIAGNOSIS — D0512 Intraductal carcinoma in situ of left breast: Secondary | ICD-10-CM | POA: Insufficient documentation

## 2018-03-15 DIAGNOSIS — R928 Other abnormal and inconclusive findings on diagnostic imaging of breast: Secondary | ICD-10-CM

## 2018-04-01 ENCOUNTER — Ambulatory Visit
Admission: RE | Admit: 2018-04-01 | Discharge: 2018-04-01 | Disposition: A | Discharge status: Home / Self Care | Payer: Medicare Other | Source: Ambulatory Visit | Attending: Urgent Care | Admitting: Urgent Care

## 2018-04-01 DIAGNOSIS — R928 Other abnormal and inconclusive findings on diagnostic imaging of breast: Secondary | ICD-10-CM

## 2018-04-01 DIAGNOSIS — N6320 Unspecified lump in the left breast, unspecified quadrant: Secondary | ICD-10-CM

## 2018-04-28 ENCOUNTER — Ambulatory Visit (INDEPENDENT_AMBULATORY_CARE_PROVIDER_SITE_OTHER): Payer: Medicare Other | Admitting: Urology

## 2018-04-28 ENCOUNTER — Encounter: Payer: Self-pay | Admitting: Urology

## 2018-04-28 DIAGNOSIS — C679 Malignant neoplasm of bladder, unspecified: Secondary | ICD-10-CM

## 2018-04-28 LAB — MICROSCOPIC EXAMINATION: WBC, UA: NONE SEEN /hpf (ref 0–5)

## 2018-04-28 LAB — URINALYSIS, COMPLETE
Bilirubin, UA: NEGATIVE
GLUCOSE, UA: NEGATIVE
Ketones, UA: NEGATIVE
LEUKOCYTES UA: NEGATIVE
Nitrite, UA: POSITIVE — AB
PH UA: 7 (ref 5.0–7.5)
PROTEIN UA: NEGATIVE
Specific Gravity, UA: 1.02 (ref 1.005–1.030)
Urobilinogen, Ur: 0.2 mg/dL (ref 0.2–1.0)

## 2018-04-28 NOTE — Progress Notes (Signed)
04/28/18  Patient presented for cysto today, however, urine nitrite positive with RBC.  She was otherwise asymptomatic.  As a precaution, will sent urine UCx today and reschedule cysto in 1-2 weeks, treat as needed as prefer to avoid scope in setting of possible infection.  Hollice Espy, MD

## 2018-05-01 LAB — CULTURE, URINE COMPREHENSIVE

## 2018-05-03 ENCOUNTER — Telehealth: Payer: Self-pay

## 2018-05-03 MED ORDER — AMOXICILLIN-POT CLAVULANATE 875-125 MG PO TABS: 1.0000 | ORAL_TABLET | Freq: Two times a day (BID) | ORAL | 0 refills | Status: DC

## 2018-05-03 NOTE — Telephone Encounter (Signed)
-----   Message from Hollice Espy, MD sent at 05/03/2018  7:52 AM EST ----- Please treat with Augmentin bid x 7 days.  Hollice Espy, MD

## 2018-05-10 NOTE — Progress Notes (Signed)
   05/12/2018   CC:  Chief Complaint  Patient presents with  . Cysto    HPI: 78 year old female admitted on 12/03/2015 with fevers, right flank pain found to have an obstructing right distal 5 mm ureteral stone. She was taken to the operating room for cystoscopy, right ureteral stent placement in the setting of worsening leukocytosis and uncontrolled pain.  Intraoperatively, an incidental bladder tumor was identified.    She returned to the operating room on 01/02/2016 for definitive management of her stone via ureteroscopy, laser lithotripsy followed by TURBT. 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  Cysto in office on 05/01/2016 showed a very small low-grade appearing recurrence ~2 mm, anterior bladder wall in close proximity to the bladder neck. She underwent cystoscopy, fulguration of this lesion in the office shortly thereafter which was well tolerated.    Patient recently had concern for possible early recurrence on 10/2017 with some subtle carpeting around the stellate scar as well as focal area of erythema.  She was taken to the operating room shortly thereafter at which time no pathology was identified.  Bilateral retrogrades were unremarkable.    This is a rescheduled cystoscopy.  She was treated for a UTI 04/28/2018; she grew staphylococcus epidermidis.  She returns today for routine office cystoscopy.  Vitals:   05/12/18 1103  BP: (!) 144/78  Pulse: 73   Height 5\' 2"  (1.575 m), weight 203 lb (92.1 kg). NED. A&Ox3.   No respiratory distress   Abd soft, NT, ND Normal external genitalia with patent urethral meatus, significant cystocele with urethral caruncle  Cystoscopy/ Bladder lesion fulgeration procedure Note  Patient identification was confirmed, informed consent was obtained, and patient was prepped using Betadine solution.    Preoperative abx where received prior to  procedure.    Procedure: - Flexible cystoscope introduced, without any difficulty.   - Thorough search of the bladder revealed:    normal urethral meatus    normal urothelium with stellate scar on the right posterior lateral bladder wall.      no ulcers     no urethral polyps    no trabeculation  - Ureteral orifices were normal in position and appearance.  Mild subtle cobblestoning of prolapse portion of bladder including trigone.  Retroflexion showed no evidence of bladder tumor and bladder neck  Post-Procedure: - Patient tolerated the procedure well  Assessment/ Plan:  1. 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) H/o incidental bladder tumor, LgTa 1 cm s/p TURBT 12/2015 Small low-grade appearing recurrence 04/2016 NED Cystoscopy today with some subtle nonspecific inflammatory type changes, will continue to follow closely Plan to transition to 6 month cystoscopies as her last recurrence was 2018 - ciprofloxacin (CIPRO) tablet 500 mg; Take 1 tablet (500 mg total) by mouth once. - lidocaine (XYLOCAINE) 2 % jelly 1 application; Place 1 application into the urethra once.  2. History of kidney stones Currently asymptomatic.  RUS 07/2017 negative  Lemar Lofty, am acting as a scribe for Hollice Espy, MD.    I have reviewed the above documentation for accuracy and completeness, and I agree with the above.   Hollice Espy, MD

## 2018-05-12 ENCOUNTER — Encounter: Payer: Self-pay | Admitting: Urology

## 2018-05-12 ENCOUNTER — Ambulatory Visit: Payer: Medicare Other | Admitting: Urology

## 2018-05-12 VITALS — BP 144/78 | HR 73 | Ht 62.0 in | Wt 203.0 lb

## 2018-05-12 DIAGNOSIS — Z87442 Personal history of urinary calculi: Secondary | ICD-10-CM

## 2018-05-12 DIAGNOSIS — C679 Malignant neoplasm of bladder, unspecified: Secondary | ICD-10-CM | POA: Diagnosis not present

## 2018-05-12 LAB — URINALYSIS, COMPLETE
BILIRUBIN UA: NEGATIVE
Glucose, UA: NEGATIVE
Ketones, UA: NEGATIVE
Nitrite, UA: NEGATIVE
PH UA: 6.5 (ref 5.0–7.5)
PROTEIN UA: NEGATIVE
Specific Gravity, UA: 1.02 (ref 1.005–1.030)
Urobilinogen, Ur: 0.2 mg/dL (ref 0.2–1.0)

## 2018-05-12 LAB — MICROSCOPIC EXAMINATION

## 2018-05-12 MED ORDER — LIDOCAINE HCL URETHRAL/MUCOSAL 2 % EX GEL: 1.0000 "application " | Freq: Once | CUTANEOUS | Status: DC

## 2018-09-06 ENCOUNTER — Other Ambulatory Visit: Payer: Self-pay | Admitting: Internal Medicine

## 2018-09-06 DIAGNOSIS — N641 Fat necrosis of breast: Secondary | ICD-10-CM

## 2018-10-04 ENCOUNTER — Other Ambulatory Visit: Payer: Medicare Other

## 2018-10-12 ENCOUNTER — Ambulatory Visit
Admission: RE | Admit: 2018-10-12 | Discharge: 2018-10-12 | Disposition: A | Discharge status: Home / Self Care | Payer: Medicare Other | Source: Ambulatory Visit | Attending: Internal Medicine | Admitting: Internal Medicine

## 2018-10-12 DIAGNOSIS — N641 Fat necrosis of breast: Secondary | ICD-10-CM | POA: Diagnosis present

## 2018-10-13 ENCOUNTER — Other Ambulatory Visit: Payer: Self-pay | Admitting: Internal Medicine

## 2018-10-14 ENCOUNTER — Other Ambulatory Visit: Payer: Self-pay | Admitting: Internal Medicine

## 2018-10-14 DIAGNOSIS — N6320 Unspecified lump in the left breast, unspecified quadrant: Secondary | ICD-10-CM

## 2018-11-10 ENCOUNTER — Other Ambulatory Visit: Payer: Self-pay

## 2018-11-10 ENCOUNTER — Ambulatory Visit (INDEPENDENT_AMBULATORY_CARE_PROVIDER_SITE_OTHER): Payer: Medicare Other | Admitting: Urology

## 2018-11-10 ENCOUNTER — Encounter: Payer: Self-pay | Admitting: Urology

## 2018-11-10 VITALS — BP 157/73 | HR 71 | Ht 62.0 in | Wt 201.0 lb

## 2018-11-10 DIAGNOSIS — C679 Malignant neoplasm of bladder, unspecified: Secondary | ICD-10-CM

## 2018-11-10 LAB — URINALYSIS, COMPLETE
Bilirubin, UA: NEGATIVE
Glucose, UA: NEGATIVE
Ketones, UA: NEGATIVE
Leukocytes,UA: NEGATIVE
Nitrite, UA: NEGATIVE
Protein,UA: NEGATIVE
Specific Gravity, UA: 1.015 (ref 1.005–1.030)
Urobilinogen, Ur: 0.2 mg/dL (ref 0.2–1.0)
pH, UA: 6 (ref 5.0–7.5)

## 2018-11-10 LAB — MICROSCOPIC EXAMINATION

## 2018-11-10 NOTE — Progress Notes (Signed)
   11/10/2018   CC:  Chief Complaint  Patient presents with  . Cysto    HPI: 78 year old female admitted on 12/03/2015 with fevers, right flank pain found to have an obstructing right distal 5 mm ureteral stone. She was taken to the operating room for cystoscopy, right ureteral stent placement in the setting of worsening leukocytosis and uncontrolled pain.  Intraoperatively, an incidental bladder tumor was identified.    She returned to the operating room on 01/02/2016 for definitive management of her stone via ureteroscopy, laser lithotripsy followed by TURBT. 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  Cysto in office on 05/01/2016 showed a very small low-grade appearing recurrence ~2 mm, anterior bladder wall in close proximity to the bladder neck. She underwent cystoscopy, fulguration of this lesion in the office shortly thereafter which was well tolerated.    Patient recently had concern for possible early recurrence on 10/2017 with some subtle carpeting around the stellate scar as well as focal area of erythema.  She was taken to the operating room shortly thereafter at which time no pathology was identified.  Bilateral retrogrades were unremarkable.    She returns today for routine office cystoscopy.  She is excited about her upcoming 78th birthday.  Vitals:   11/10/18 1102  BP: (!) 157/73  Pulse: 71   Height 5\' 2"  (1.575 m), weight 203 lb (92.1 kg). NED. A&Ox3.   No respiratory distress   Abd soft, NT, ND Normal external genitalia with patent urethral meatus, significant cystocele with urethral caruncle, vaginal prolapse reduced for procedure  Cystoscopy/ Bladder lesion fulgeration procedure Note  Patient identification was confirmed, informed consent was obtained, and patient was prepped using Betadine solution.    Preoperative abx where received prior to procedure.     Procedure: - Flexible cystoscope introduced, without any difficulty.   - Thorough search of the bladder revealed:    normal urethral meatus    normal urothelium with stellate scar on the right posterior lateral bladder wall.      no ulcers     no urethral polyps    no trabeculation  - Ureteral orifices were normal in position and appearance.  Mild subtle cobblestoning of prolapse portion of bladder including trigone.  Retroflexion showed no evidence of bladder tumor and bladder neck  Post-Procedure: - Patient tolerated the procedure well  Assessment/ Plan:  1. 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  2. History of kidney stones Currently asymptomatic.  RUS 07/2017 negative  F/u 6 months for cysto  Hollice Espy, MD

## 2018-12-16 NOTE — Progress Notes (Signed)
Patient is coming in for follow up she is doing well. She mentions pain in her hips but takes medication for it. She is aware of the no visitor policy.

## 2018-12-17 ENCOUNTER — Inpatient Hospital Stay: Payer: Medicare Other | Attending: Oncology

## 2018-12-17 ENCOUNTER — Inpatient Hospital Stay (HOSPITAL_BASED_OUTPATIENT_CLINIC_OR_DEPARTMENT_OTHER): Payer: Medicare Other | Admitting: Oncology

## 2018-12-17 ENCOUNTER — Other Ambulatory Visit: Payer: Self-pay

## 2018-12-17 ENCOUNTER — Encounter: Payer: Self-pay | Admitting: Oncology

## 2018-12-17 VITALS — BP 164/83 | HR 65 | Temp 97.2°F | Resp 20 | Ht 62.0 in | Wt 200.9 lb

## 2018-12-17 DIAGNOSIS — J45909 Unspecified asthma, uncomplicated: Secondary | ICD-10-CM | POA: Diagnosis not present

## 2018-12-17 DIAGNOSIS — E785 Hyperlipidemia, unspecified: Secondary | ICD-10-CM | POA: Diagnosis not present

## 2018-12-17 DIAGNOSIS — Z79899 Other long term (current) drug therapy: Secondary | ICD-10-CM | POA: Diagnosis not present

## 2018-12-17 DIAGNOSIS — Z7982 Long term (current) use of aspirin: Secondary | ICD-10-CM | POA: Insufficient documentation

## 2018-12-17 DIAGNOSIS — Z170 Estrogen receptor positive status [ER+]: Secondary | ICD-10-CM | POA: Diagnosis not present

## 2018-12-17 DIAGNOSIS — Z79811 Long term (current) use of aromatase inhibitors: Secondary | ICD-10-CM | POA: Insufficient documentation

## 2018-12-17 DIAGNOSIS — I4891 Unspecified atrial fibrillation: Secondary | ICD-10-CM | POA: Insufficient documentation

## 2018-12-17 DIAGNOSIS — I10 Essential (primary) hypertension: Secondary | ICD-10-CM | POA: Insufficient documentation

## 2018-12-17 DIAGNOSIS — G4730 Sleep apnea, unspecified: Secondary | ICD-10-CM | POA: Insufficient documentation

## 2018-12-17 DIAGNOSIS — Z791 Long term (current) use of non-steroidal anti-inflammatories (NSAID): Secondary | ICD-10-CM | POA: Diagnosis not present

## 2018-12-17 DIAGNOSIS — D0512 Intraductal carcinoma in situ of left breast: Secondary | ICD-10-CM | POA: Diagnosis not present

## 2018-12-17 DIAGNOSIS — Z8551 Personal history of malignant neoplasm of bladder: Secondary | ICD-10-CM | POA: Diagnosis not present

## 2018-12-17 DIAGNOSIS — Z923 Personal history of irradiation: Secondary | ICD-10-CM | POA: Insufficient documentation

## 2018-12-17 DIAGNOSIS — E039 Hypothyroidism, unspecified: Secondary | ICD-10-CM | POA: Diagnosis not present

## 2018-12-17 DIAGNOSIS — Z803 Family history of malignant neoplasm of breast: Secondary | ICD-10-CM | POA: Diagnosis not present

## 2018-12-17 DIAGNOSIS — Z853 Personal history of malignant neoplasm of breast: Secondary | ICD-10-CM

## 2018-12-17 DIAGNOSIS — Z809 Family history of malignant neoplasm, unspecified: Secondary | ICD-10-CM | POA: Diagnosis not present

## 2018-12-17 DIAGNOSIS — Z85828 Personal history of other malignant neoplasm of skin: Secondary | ICD-10-CM | POA: Insufficient documentation

## 2018-12-17 LAB — COMPREHENSIVE METABOLIC PANEL
ALT: 39 U/L (ref 0–44)
AST: 43 U/L — ABNORMAL HIGH (ref 15–41)
Albumin: 4.1 g/dL (ref 3.5–5.0)
Alkaline Phosphatase: 63 U/L (ref 38–126)
Anion gap: 11 (ref 5–15)
BUN: 19 mg/dL (ref 8–23)
CO2: 28 mmol/L (ref 22–32)
Calcium: 9 mg/dL (ref 8.9–10.3)
Chloride: 101 mmol/L (ref 98–111)
Creatinine, Ser: 0.98 mg/dL (ref 0.44–1.00)
GFR calc Af Amer: >60 mL/min (ref >60)
GFR calc non Af Amer: 54 mL/min — ABNORMAL LOW (ref >60)
Glucose, Bld: 185 mg/dL — ABNORMAL HIGH (ref 70–99)
Potassium: 3.3 mmol/L — ABNORMAL LOW (ref 3.5–5.1)
Sodium: 140 mmol/L (ref 135–145)
Total Bilirubin: 0.6 mg/dL (ref 0.3–1.2)
Total Protein: 7 g/dL (ref 6.5–8.1)

## 2018-12-17 LAB — CBC WITH DIFFERENTIAL/PLATELET
Abs Immature Granulocytes: 0.03 10*3/uL (ref 0.00–0.07)
Basophils Absolute: 0 10*3/uL (ref 0.0–0.1)
Basophils Relative: 0 %
Eosinophils Absolute: 0.4 10*3/uL (ref 0.0–0.5)
Eosinophils Relative: 5 %
HCT: 43.8 % (ref 36.0–46.0)
Hemoglobin: 14.4 g/dL (ref 12.0–15.0)
Immature Granulocytes: 0 %
Lymphocytes Relative: 18 %
Lymphs Abs: 1.2 10*3/uL (ref 0.7–4.0)
MCH: 30.6 pg (ref 26.0–34.0)
MCHC: 32.9 g/dL (ref 30.0–36.0)
MCV: 93.2 fL (ref 80.0–100.0)
Monocytes Absolute: 0.5 10*3/uL (ref 0.1–1.0)
Monocytes Relative: 7 %
Neutro Abs: 4.7 10*3/uL (ref 1.7–7.7)
Neutrophils Relative %: 70 %
Platelets: 240 10*3/uL (ref 150–400)
RBC: 4.7 MIL/uL (ref 3.87–5.11)
RDW: 13.2 % (ref 11.5–15.5)
WBC: 6.8 10*3/uL (ref 4.0–10.5)
nRBC: 0 % (ref 0.0–0.2)

## 2018-12-17 NOTE — Progress Notes (Signed)
No new complaints today

## 2018-12-17 NOTE — Progress Notes (Signed)
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 Mike Gip.  She switched care to me on 12/17/2018. 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  Pathology revealed a 3.6 cm area of grade II ductal carcinoma in situ. Margins were clear but close at 1 mm.  DCIS was ER + (> 90%), PR + (> 90%).  Pathologic stage was TisNx.  Status post adjuvant radiation which was completed in 06/2012.    Antiestrogen treatments : she was initially treated with Femara, but discontinued secondary to joint pains.  She tried tamoxifen, but discontinued secondary to swelling.  She began Aromasin since 11/16/2012.  Stopped Aromasin in September 2019 after finished total of 5 years of antiestrogen treatments.  Bilateral diagnostic mammogram on 03/24/2016 revealed no evidence of malignancy.  Diagnostic left mammogram and ultrasound on 06/09/2016 revealed an irregular hypoechogenicity in the left breast at 6 o'clock 2 cm from the nipple measuring 9 x 7 x 10 mm. This was felt likely to represent post treatment change but underlying malignancy could not be excluded.  Ultrasound of the left axilla revealed no enlarged adenopathy.   Ultrasound guided biopsy on 06/19/2016 revealed changes c/w organizing fat necrosis.  Bilateral diagnostic mammogram on 03/13/2017 that revealed a suspicious 6 mm mass in the superficial upper outer left breast.  Biopsy on 03/24/2017 results revealed fat necrosis with dystrophic calcification.   Bone density on 08/22/2015 was normal with a T-score of -1.0 in the left femur.  Bone density on 08/25/2017 was normal with a T-score of -0.8 in the femoral neck.  #, 2017, history of non-muscle invasive bladder cancer.  She  underwent TURBT on 01/02/2016.  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  Cystoscopy on 05/01/2016 revealed a very small low-grade appearing recurrence.  She underwent cystoscopy and fulguration on 05/06/2016.  Cystoscopy on 04/29/2017 revealed no evidence of disease.  Next cystoscopy planned for 07/28/2017.  INTERVAL HISTORY [name] is a 78 y.o. female who has above history reviewed by me today presents for follow up visit for management of history of DCIS Problems and complaints are listed below: Patient reports fine.  Denies any breast concerns. She has stopped Arimidex per Dr. Harlon Flor recommendation after being seen in September 2020 Denies any fever, chills, nausea, vomiting, chest pain, abdominal pain.  Past Medical History:  Diagnosis Date  . Acute biliary pancreatitis   . Arthritis   . Asthma   . Back pain   . Bladder cancer ([place])   . Breast cancer ([place]) 2013   left breast ductal and lobular carcinoma  . Choledocholithiasis   . Difficult intubation   . Dysrhythmia 12/2015   A-fib  . Family history of adverse reaction to anesthesia    dad had hard time waking up from anesthesia  . Gallstone pancreatitis 05/13/2017  . GERD (gastroesophageal reflux disease)   . History of kidney stones   . Hyperlipidemia   . Hypertension   . Hypothyroidism   . Kidney stones 12/2015  . Personal history of radiation therapy 2013   BREAST CA  . Shingles   . Skin cancer 2005   left arm and nose  . Sleep apnea     Past Surgical  History:  Procedure Laterality Date  . BREAST BIOPSY Left 2013   POS  . BREAST BIOPSY Left 2018   benign  . BREAST LUMPECTOMY Left 2013   with rad tx  . CARPAL TUNNEL RELEASE    . CATARACT EXTRACTION    . CHOLECYSTECTOMY N/A 05/15/2017   Procedure: LAPAROSCOPIC CHOLECYSTECTOMY WITH INTRAOPERATIVE CHOLANGIOGRAM;  Surgeon: Vickie Epley, MD;  Location: [facility] ORS;  Service: General;  Laterality: N/A;  . COLONOSCOPY WITH  PROPOFOL N/A 05/10/2015   Procedure: COLONOSCOPY WITH PROPOFOL;  Surgeon: Hulen Luster, MD;  Location: [facility] ENDOSCOPY;  Service: Gastroenterology;  Laterality: N/A;  . CYSTOSCOPY W/ RETROGRADES Bilateral 01/02/2016   Procedure: CYSTOSCOPY WITH RETROGRADE PYELOGRAM;  Surgeon: Hollice Espy, MD;  Location: [facility] ORS;  Service: Urology;  Laterality: Bilateral;  . CYSTOSCOPY W/ RETROGRADES Bilateral 11/04/2017   Procedure: CYSTOSCOPY WITH RETROGRADE PYELOGRAM;  Surgeon: Hollice Espy, MD;  Location: [facility] ORS;  Service: Urology;  Laterality: Bilateral;  . CYSTOSCOPY W/ URETERAL STENT PLACEMENT Right 01/02/2016   Procedure: CYSTOSCOPY WITH STENT REPLACEMENT;  Surgeon: Hollice Espy, MD;  Location: [facility] ORS;  Service: Urology;  Laterality: Right;  . CYSTOSCOPY WITH STENT PLACEMENT Right 12/04/2015   Procedure: CYSTOSCOPY WITH STENT PLACEMENT;  Surgeon: Hollice Espy, MD;  Location: [facility] ORS;  Service: Urology;  Laterality: Right;  . ENDOSCOPIC RETROGRADE CHOLANGIOPANCREATOGRAPHY (ERCP) WITH PROPOFOL N/A 05/14/2017   Procedure: ENDOSCOPIC RETROGRADE CHOLANGIOPANCREATOGRAPHY (ERCP) WITH PROPOFOL;  Surgeon: Lucilla Lame, MD;  Location: [facility] ENDOSCOPY;  Service: Endoscopy;  Laterality: N/A;  . JOINT REPLACEMENT Left 2013   hip  . KNEE ARTHROSCOPY    . SEPTOPLASTY    . TRANSURETHRAL RESECTION OF BLADDER TUMOR WITH MITOMYCIN-C N/A 01/02/2016   Procedure: TRANSURETHRAL RESECTION OF BLADDER TUMOR WITH MITOMYCIN-C;  Surgeon: Hollice Espy, MD;  Location: [facility] ORS;  Service: Urology;  Laterality: N/A;  . URETEROSCOPY WITH HOLMIUM LASER LITHOTRIPSY Right 01/02/2016   Procedure: URETEROSCOPY WITH HOLMIUM LASER LITHOTRIPSY;  Surgeon: Hollice Espy, MD;  Location: [facility] ORS;  Service: Urology;  Laterality: Right;  . UVULOPALATOPHARYNGOPLASTY     and tongue surgery    Family History  Problem Relation Age of Onset  . Breast cancer Mother 69  . Kidney cancer Father   . Kidney Stones Brother   . Prostate cancer Neg Hx      Social History:  reports that she has never smoked. She has never used smokeless tobacco. She reports that she does not drink alcohol or use drugs.  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  She lives in [place].  The patient is alone today.  Allergies:  Allergies  Allergen Reactions  . Ativan [Lorazepam] Other (See Comments)    Hysteria   . Cardura [Doxazosin] Other (See Comments)    unsure  . Clonidine Derivatives Other (See Comments)    unsure  . Enalapril Other (See Comments)    unsure  . Levaquin [Levofloxacin] Other (See Comments)    Couldn't raise her arms  . Mobic [Meloxicam] Other (See Comments)    unsure  . Sulfa Antibiotics Nausea Only  . Vicodin [Hydrocodone-Acetaminophen] Other (See Comments)    unsure    Current Medications: Current Outpatient Medications  Medication Sig Dispense Refill  . acetaminophen (TYLENOL) 500 MG tablet Take 1,000 mg by mouth 2 (two) times daily.    Marland Kitchen amLODipine (NORVASC) 5 MG tablet Take 5 mg by mouth every morning.     Marland Kitchen aspirin EC 81 MG tablet Take 1 tablet (81 mg total) by mouth daily. 90 tablet 3  .  Calcium Carb-Cholecalciferol (CALCIUM 600+D3) 600-800 MG-UNIT TABS Take 1 tablet by mouth 2 (two) times daily.    Marland Kitchen docusate sodium (COLACE) 50 MG capsule Take 50 mg by mouth daily.    . dorzolamide-timolol (COSOPT) 22.3-6.8 MG/ML ophthalmic solution Place 1 drop into both eyes 2 (two) times daily.     . furosemide (LASIX) 20 MG tablet Take 20 mg by mouth daily.    . hydrochlorothiazide (HYDRODIURIL) 25 MG tablet Take 25 mg by mouth daily.    Marland Kitchen ibuprofen (ADVIL,MOTRIN) 200 MG tablet Take 400-600 mg by mouth every 6 (six) hours as needed for headache or moderate pain.     Marland Kitchen levothyroxine (SYNTHROID, LEVOTHROID) 75 MCG tablet Take 75 mcg by mouth daily before breakfast.    . loratadine (CLARITIN) 10 MG tablet Take 10 mg by mouth daily.    Marland Kitchen losartan (COZAAR) 100 MG tablet Take 100 mg by mouth every morning.     . lovastatin (MEVACOR) 40 MG tablet  Take 40 mg by mouth at bedtime.    Marland Kitchen LUMIGAN 0.01 % SOLN Place 1 drop into both eyes at bedtime.     . metoprolol tartrate (LOPRESSOR) 100 MG tablet Take 100 mg by mouth 2 (two) times daily.    . pantoprazole (PROTONIX) 40 MG tablet Take 40 mg by mouth at bedtime.     . potassium chloride SA (K-DUR,KLOR-CON) 20 MEQ tablet Take 20 mEq by mouth 2 (two) times daily.    . vitamin B-12 (CYANOCOBALAMIN) 1000 MCG tablet Take 1,000 mcg by mouth daily.    . vitamin C (ASCORBIC ACID) 500 MG tablet Take 500 mg by mouth daily.    Marland Kitchen anastrozole (ARIMIDEX) 1 MG tablet TAKE 1 TABLET BY MOUTH DAILY (Patient not taking: Reported on 12/16/2018) 30 tablet 3   Current Facility-Administered Medications  Medication Dose Route Frequency Provider Last Rate Last Dose  . lidocaine (XYLOCAINE) 2 % jelly 1 application  1 application Urethral Once Hollice Espy, MD       Review of Systems  Constitutional: Negative for appetite change, chills, fatigue and fever.  HENT:   Negative for hearing loss and voice change.   Eyes: Negative for eye problems.  Respiratory: Negative for chest tightness and cough.   Cardiovascular: Negative for chest pain.  Gastrointestinal: Negative for abdominal distention, abdominal pain and blood in stool.  Endocrine: Negative for hot flashes.  Genitourinary: Negative for difficulty urinating and frequency.   Skin: Negative for itching and rash.  Neurological: Negative for extremity weakness.  Hematological: Negative for adenopathy.  Psychiatric/Behavioral: Negative for confusion.     Physical Exam: Blood pressure (!) 164/83, pulse 65, temperature (!) 97.2 F (36.2 C), temperature source Tympanic, resp. rate 20, height 5\' 2"  (1.575 m), weight 200 lb 14.4 oz (91.1 kg).  Physical Exam  Constitutional: She is oriented to person, place, and time. No distress.  Obese. Walk in   HENT:  Head: Normocephalic and atraumatic.  Nose: Nose normal.  Mouth/Throat: Oropharynx is clear and moist. No  oropharyngeal exudate.  Eyes: Pupils are equal, round, and reactive to light. EOM are normal. No scleral icterus.  Neck: Normal range of motion. Neck supple.  Cardiovascular: Normal rate and regular rhythm.  No murmur heard. Pulmonary/Chest: Effort normal. No respiratory distress. She has no rales. She exhibits no tenderness.  Abdominal: Soft. She exhibits no distension. There is no abdominal tenderness.  Musculoskeletal: Normal range of motion.        General: No edema.  Neurological: She is alert  and oriented to person, place, and time. No cranial nerve deficit. She exhibits normal muscle tone. Coordination normal.  Skin: Skin is warm and dry. She is not diaphoretic. No erythema.  Psychiatric: Affect normal.   Breast exam was performed in seated and lying down position. Patient is status post left lumpectomy with a well-healed surgical scar. post-operative changes between 6 o'clock and 6:30 position.  Left nipple indentation with scar.   No palpable masses or axillary adenopathy bilaterally.    Appointment on 12/17/2018  Component Date Value Ref Range Status  . WBC 12/17/2018 6.8  4.0 - 10.5 K/uL Final  . RBC 12/17/2018 4.70  3.87 - 5.11 MIL/uL Final  . Hemoglobin 12/17/2018 14.4  12.0 - 15.0 g/dL Final  . HCT 12/17/2018 43.8  36.0 - 46.0 % Final  . MCV 12/17/2018 93.2  80.0 - 100.0 fL Final  . MCH 12/17/2018 30.6  26.0 - 34.0 pg Final  . MCHC 12/17/2018 32.9  30.0 - 36.0 g/dL Final  . RDW 12/17/2018 13.2  11.5 - 15.5 % Final  . Platelets 12/17/2018 240  150 - 400 K/uL Final  . nRBC 12/17/2018 0.0  0.0 - 0.2 % Final  . Neutrophils Relative % 12/17/2018 70  % Final  . Neutro Abs 12/17/2018 4.7  1.7 - 7.7 K/uL Final  . Lymphocytes Relative 12/17/2018 18  % Final  . Lymphs Abs 12/17/2018 1.2  0.7 - 4.0 K/uL Final  . Monocytes Relative 12/17/2018 7  % Final  . Monocytes Absolute 12/17/2018 0.5  0.1 - 1.0 K/uL Final  . Eosinophils Relative 12/17/2018 5  % Final  . Eosinophils  Absolute 12/17/2018 0.4  0.0 - 0.5 K/uL Final  . Basophils Relative 12/17/2018 0  % Final  . Basophils Absolute 12/17/2018 0.0  0.0 - 0.1 K/uL Final  . Immature Granulocytes 12/17/2018 0  % Final  . Abs Immature Granulocytes 12/17/2018 0.03  0.00 - 0.07 K/uL Final   Performed at [facility], [address] [place], [place] [address]  . Sodium 12/17/2018 140  135 - 145 mmol/L Final  . Potassium 12/17/2018 3.3* 3.5 - 5.1 mmol/L Final  . Chloride 12/17/2018 101  98 - 111 mmol/L Final  . CO2 12/17/2018 28  22 - 32 mmol/L Final  . Glucose, Bld 12/17/2018 185* 70 - 99 mg/dL Final  . BUN 12/17/2018 19  8 - 23 mg/dL Final  . Creatinine, Ser 12/17/2018 0.98  0.44 - 1.00 mg/dL Final  . Calcium 12/17/2018 9.0  8.9 - 10.3 mg/dL Final  . Total Protein 12/17/2018 7.0  6.5 - 8.1 g/dL Final  . Albumin 12/17/2018 4.1  3.5 - 5.0 g/dL Final  . AST 12/17/2018 43* 15 - 41 U/L Final  . ALT 12/17/2018 39  0 - 44 U/L Final  . Alkaline Phosphatase 12/17/2018 63  38 - 126 U/L Final  . Total Bilirubin 12/17/2018 0.6  0.3 - 1.2 mg/dL Final  . GFR calc non Af Amer 12/17/2018 54* >60 mL/min Final  . GFR calc Af Amer 12/17/2018 >60  >60 mL/min Final  . Anion gap 12/17/2018 11  5 - 15 Final   Performed at [facility], [address] [place], [place] [address]    Assessment:  ERIEL SCHMALL is a 78 y.o. female presents for DCIS follow-up.  1. Ductal carcinoma in situ (DCIS) of left breast   2. History of breast cancer   3. Family history of cancer    Patient is clinically doing  well.  She has finished 5 years of antiestrogen treatments. 10/12/2018 unilateral left diagnostic mammogram showed stable to minimal decrease in the size of biopsy-proven fat necrosis involving a subcutaneous fat locule in the upper outer quadrant of the left breast. Patient is due for bilateral diagnostic mammogram in December 2020.  Will obtain.  Patient has personal history of DCIS, noninvasive bladder cancer, family  history of esophageal cancer -sister and breat cancer - mother, recommend genetic testing.  Refer to genetic counseling.  History of noninvasive bladder cancer, continue follow-up with urology.  Patient was seen by Dr. Erlene Quan in August 2020. RTC in 1 year for MD assessment and labs (CBC with diff, CMP).  We spent sufficient time to discuss many aspect of care, questions were answered to patient's satisfaction. Total face to face encounter time for this patient visit was 40 min. >50% of the time was  spent in counseling and coordination of care.   Earlie Server, MD  12/15/2017, 11:30 PM

## 2018-12-23 ENCOUNTER — Ambulatory Visit: Payer: Medicare Other | Admitting: Hematology and Oncology

## 2018-12-23 ENCOUNTER — Other Ambulatory Visit: Payer: Medicare Other

## 2019-03-17 ENCOUNTER — Ambulatory Visit
Admission: RE | Admit: 2019-03-17 | Discharge: 2019-03-17 | Disposition: A | Discharge status: Home / Self Care | Payer: Medicare Other | Source: Ambulatory Visit | Attending: Internal Medicine | Admitting: Internal Medicine

## 2019-03-17 DIAGNOSIS — N6320 Unspecified lump in the left breast, unspecified quadrant: Secondary | ICD-10-CM

## 2019-03-17 DIAGNOSIS — N6321 Unspecified lump in the left breast, upper outer quadrant: Secondary | ICD-10-CM | POA: Insufficient documentation

## 2019-03-29 ENCOUNTER — Telehealth: Payer: Self-pay | Admitting: Oncology

## 2019-03-29 NOTE — Telephone Encounter (Signed)
LM for PT to call back about Genetics appt on 04/07/19-ltg

## 2019-04-06 ENCOUNTER — Other Ambulatory Visit: Payer: Self-pay

## 2019-04-07 ENCOUNTER — Inpatient Hospital Stay: Payer: Medicare Other

## 2019-04-07 ENCOUNTER — Encounter: Payer: Self-pay | Admitting: Licensed Clinical Social Worker

## 2019-04-07 ENCOUNTER — Other Ambulatory Visit: Payer: Self-pay

## 2019-04-07 ENCOUNTER — Inpatient Hospital Stay: Payer: Medicare Other | Attending: Oncology | Admitting: Licensed Clinical Social Worker

## 2019-04-07 DIAGNOSIS — Z803 Family history of malignant neoplasm of breast: Secondary | ICD-10-CM

## 2019-04-07 DIAGNOSIS — Z8052 Family history of malignant neoplasm of bladder: Secondary | ICD-10-CM

## 2019-04-07 DIAGNOSIS — D0512 Intraductal carcinoma in situ of left breast: Secondary | ICD-10-CM

## 2019-04-07 DIAGNOSIS — Z8551 Personal history of malignant neoplasm of bladder: Secondary | ICD-10-CM

## 2019-04-07 DIAGNOSIS — Z808 Family history of malignant neoplasm of other organs or systems: Secondary | ICD-10-CM | POA: Diagnosis not present

## 2019-04-07 DIAGNOSIS — Z8042 Family history of malignant neoplasm of prostate: Secondary | ICD-10-CM | POA: Diagnosis not present

## 2019-04-07 NOTE — Progress Notes (Signed)
REFERRING PROVIDER: Earlie Server, MD [place],  [place] [id number]  PRIMARY PROVIDER:  Adin Hector, MD  PRIMARY REASON FOR VISIT:  1. Ductal carcinoma in situ (DCIS) of left breast   2. Family history of breast cancer   3. Family history of prostate cancer   4. Family history of skin cancer   5. Family history of bladder cancer   6. History of bladder cancer    I connected with Ms. Polidore on 04/07/2019 at 11:20 AM EDT by Webex and verified that I am speaking with the correct person using two identifiers.    Patient location: [facility] center Provider location: [facility]  HISTORY OF PRESENT ILLNESS:   Ms. Alyssa Duncan, a 78 y.o. female, was seen for a [facility] cancer genetics consultation at the request of Dr. Tasia Catchings due to a personal and family history of cancer.  Ms. Mccard presents to clinic today to discuss the possibility of a hereditary predisposition to cancer, genetic testing, and to further clarify her future cancer risks, as well as potential cancer risks for family members.   At the age of 77, Ms. Henkels was diagnosed with DCIS of the left breast. This was treated with surgery, radiation and antiestrogens. At the age of 32, Ms. Panik was diagnosed with bladder cancer (urothelial carcinoma).     RISK FACTORS:  Menarche was at age 32.  First live birth at age 89.  OCP use for approximately 5 years.  Ovaries intact: yes.  Hysterectomy: no.  Menopausal status: postmenopausal.  HRT use: 2 years. Colonoscopy: yes; 4 polyps on first cscope. Number of breast biopsies: 5.   Past Medical History:  Diagnosis Date  . Acute biliary pancreatitis   . Arthritis   . Asthma   . Back pain   . Bladder cancer ([place])   . Breast cancer ([place]) 2013   left breast ductal and lobular carcinoma  . Choledocholithiasis   . Difficult intubation   . Dysrhythmia 12/2015   A-fib  . Family history of adverse reaction to anesthesia    dad had hard time waking up from  anesthesia  . Family history of bladder cancer   . Family history of breast cancer   . Family history of prostate cancer   . Family history of skin cancer   . Gallstone pancreatitis 05/13/2017  . GERD (gastroesophageal reflux disease)   . History of kidney stones   . Hyperlipidemia   . Hypertension   . Hypothyroidism   . Kidney stones 12/2015  . Personal history of radiation therapy 2013   BREAST CA  . Shingles   . Skin cancer 2005   left arm and nose  . Sleep apnea     Past Surgical History:  Procedure Laterality Date  . BREAST BIOPSY Left 2013   POS  . BREAST BIOPSY Left 2018   benign fat necrosis  . BREAST LUMPECTOMY Left 2013   with rad tx  . CARPAL TUNNEL RELEASE    . CATARACT EXTRACTION    . CHOLECYSTECTOMY N/A 05/15/2017   Procedure: LAPAROSCOPIC CHOLECYSTECTOMY WITH INTRAOPERATIVE CHOLANGIOGRAM;  Surgeon: Vickie Epley, MD;  Location: [facility] ORS;  Service: General;  Laterality: N/A;  . COLONOSCOPY WITH PROPOFOL N/A 05/10/2015   Procedure: COLONOSCOPY WITH PROPOFOL;  Surgeon: Hulen Luster, MD;  Location: [facility] ENDOSCOPY;  Service: Gastroenterology;  Laterality: N/A;  . CYSTOSCOPY W/ RETROGRADES Bilateral 01/02/2016   Procedure: CYSTOSCOPY WITH RETROGRADE PYELOGRAM;  Surgeon: Hollice Espy, MD;  Location: [facility] ORS;  Service: Urology;  Laterality: Bilateral;  . CYSTOSCOPY W/ RETROGRADES Bilateral 11/04/2017   Procedure: CYSTOSCOPY WITH RETROGRADE PYELOGRAM;  Surgeon: Hollice Espy, MD;  Location: [facility] ORS;  Service: Urology;  Laterality: Bilateral;  . CYSTOSCOPY W/ URETERAL STENT PLACEMENT Right 01/02/2016   Procedure: CYSTOSCOPY WITH STENT REPLACEMENT;  Surgeon: Hollice Espy, MD;  Location: [facility] ORS;  Service: Urology;  Laterality: Right;  . CYSTOSCOPY WITH STENT PLACEMENT Right 12/04/2015   Procedure: CYSTOSCOPY WITH STENT PLACEMENT;  Surgeon: Hollice Espy, MD;  Location: [facility] ORS;  Service: Urology;  Laterality: Right;  . ENDOSCOPIC RETROGRADE CHOLANGIOPANCREATOGRAPHY  (ERCP) WITH PROPOFOL N/A 05/14/2017   Procedure: ENDOSCOPIC RETROGRADE CHOLANGIOPANCREATOGRAPHY (ERCP) WITH PROPOFOL;  Surgeon: Lucilla Lame, MD;  Location: [facility] ENDOSCOPY;  Service: Endoscopy;  Laterality: N/A;  . JOINT REPLACEMENT Left 2013   hip  . KNEE ARTHROSCOPY    . SEPTOPLASTY    . TRANSURETHRAL RESECTION OF BLADDER TUMOR WITH MITOMYCIN-C N/A 01/02/2016   Procedure: TRANSURETHRAL RESECTION OF BLADDER TUMOR WITH MITOMYCIN-C;  Surgeon: Hollice Espy, MD;  Location: [facility] ORS;  Service: Urology;  Laterality: N/A;  . URETEROSCOPY WITH HOLMIUM LASER LITHOTRIPSY Right 01/02/2016   Procedure: URETEROSCOPY WITH HOLMIUM LASER LITHOTRIPSY;  Surgeon: Hollice Espy, MD;  Location: [facility] ORS;  Service: Urology;  Laterality: Right;  . UVULOPALATOPHARYNGOPLASTY     and tongue surgery    Social History   Socioeconomic History  . Marital status: Married    Spouse name: Not on file  . Number of children: Not on file  . Years of education: Not on file  . Highest education level: Not on file  Occupational History  . Not on file  Tobacco Use  . Smoking status: Never Smoker  . Smokeless tobacco: Never Used  Substance and Sexual Activity  . Alcohol use: No  . Drug use: No  . Sexual activity: Not Currently  Other Topics Concern  . Not on file  Social History Narrative  . Not on file   Social Determinants of Health   Financial Resource Strain:   . Difficulty of Paying Living Expenses: Not on file  Food Insecurity:   . Worried About Charity fundraiser in the Last Year: Not on file  . Ran Out of Food in the Last Year: Not on file  Transportation Needs:   . Lack of Transportation (Medical): Not on file  . Lack of Transportation (Non-Medical): Not on file  Physical Activity:   . Days of Exercise per Week: Not on file  . Minutes of Exercise per Session: Not on file  Stress:   . Feeling of Stress : Not on file  Social Connections:   . Frequency of Communication with Friends and Family: Not  on file  . Frequency of Social Gatherings with Friends and Family: Not on file  . Attends Religious Services: Not on file  . Active Member of Clubs or Organizations: Not on file  . Attends Archivist Meetings: Not on file  . Marital Status: Not on file     FAMILY HISTORY:  We obtained a detailed, 4-generation family history.  Significant diagnoses are listed below: Family History  Problem Relation Age of Onset  . Breast cancer Mother 74  . Bladder Cancer Father   . Kidney Stones Brother   . Skin cancer Brother        worked on farm  . Prostate cancer Brother        dx late 59s  . Esophageal cancer Sister 62  .  Skin cancer Sister   . Skin cancer Daughter   . Skin cancer Daughter   . Melanoma Daughter    Ms. Nevitt has 2 daughters and 1 son. One of her daughters had a melanoma removed from her back. Her other daughter has had skin cancer removed. Ms. Riggio has a brother who also had skin cancers removed as well as prostate cancer diagnosed in his 57s. Ms. Massar had 1 sister who had esophageal cancer and passed recently at 23.  [name] mother was diagnosed with breast cancer at 63 and died at 34. Patient had 3 maternal aunts and 1 maternal uncle, no cancers. No cancers in maternal cousins. Maternal grandparents did not have cancer both died in their 19s.  Ms. Morsch father was diagnosed with bladder cancer and also had history of skin cancers removed. He died at 16. Patient had 2 paternal aunts and 5 paternal uncles, no cancers she is aware of. No cancers in her paternal cousins, except for one cousin who does have a type of skin cancer on his face. Paternal grandparents did not have cancer.  Ms. Kiser is unaware of previous family history of genetic testing for hereditary cancer risks. Patient's maternal ancestors are of European descent, and paternal ancestors are of European descent. There is no reported Ashkenazi Jewish ancestry. There is no known consanguinity.  GENETIC  COUNSELING ASSESSMENT: Ms. Sciara is a 78 y.o. female with a personal and family history which is somewhat suggestive of a hereditary cancer syndrome and predisposition to cancer. We, therefore, discussed and recommended the following at today's visit.   DISCUSSION: We discussed that 5 - 10% of breast cancer is hereditary, with most cases associated with BRCA1/BRCA2 mutations.  There are other genes that can be associated with hereditary breast cancer syndromes and genes associated with the cancers in her family such as prostate, bladder and skin cancer.   We discussed that testing is beneficial for several reasons including knowing how to follow individuals for cancer screenings  and understand if other family members could be at risk for cancer and allow them to undergo genetic testing.   We reviewed the characteristics, features and inheritance patterns of hereditary cancer syndromes. We also discussed genetic testing, including the appropriate family members to test, the process of testing, insurance coverage and turn-around-time for results. We discussed the implications of a negative, positive and/or variant of uncertain significant result. We recommended Ms. Thoen pursue genetic testing for the [facility] Common Hereditary Cancers + Melanoma gene panel.   The Common Hereditary Cancers Panel offered by [facility] includes sequencing and/or deletion duplication testing of the following 48 genes: APC, ATM, AXIN2, BARD1, BMPR1A, BRCA1, BRCA2, BRIP1, CDH1, CDKN2A (p14ARF), CDKN2A (p16INK4a), CKD4, CHEK2, CTNNA1, DICER1, EPCAM (Deletion/duplication testing only), GREM1 (promoter region deletion/duplication testing only), KIT, MEN1, MLH1, MSH2, MSH3, MSH6, MUTYH, NBN, NF1, NHTL1, PALB2, PDGFRA, PMS2, POLD1, POLE, PTEN, RAD50, RAD51C, RAD51D, RNF43, SDHB, SDHC, SDHD, SMAD4, SMARCA4. STK11, TP53, TSC1, TSC2, and VHL.  The following genes were evaluated for sequence changes only: SDHA and HOXB13 c.251G>A variant  only.  Based on Ms. Roszak's personal and  family history of cancer, she meets medical criteria for genetic testing. Despite that she meets criteria, she may still have an out of pocket cost.   PLAN: After considering the risks, benefits, and limitations, Ms. Fraga provided informed consent to pursue genetic testing and the blood sample was sent to [facility] for analysis of the Common Hereditary Cancers Panel + Melanoma panel. Results should  be available within approximately 2-3 weeks' time, at which point they will be disclosed by telephone to Ms. Toutant, as will any additional recommendations warranted by these results. Ms. Reimann will receive a summary of her genetic counseling visit and a copy of her results once available. This information will also be available in Epic.   Ms. Hammerstrom questions were answered to her satisfaction today. Our contact information was provided should additional questions or concerns arise. Thank you for the referral and allowing [place] to share in the care of your patient.   Faith Rogue, MS, [facility] Genetic Counselor [email or web address] Phone: [phone]  The patient was seen for a total of 30 minutes in virtual genetic counseling.  Drs. Magrinat, Lindi Adie and/or Burr Medico were available for discussion regarding this case.   _______________________________________________________________________ For Office Staff:  Number of people involved in session: 1 Was an Intern/ student involved with case: no

## 2019-04-19 ENCOUNTER — Ambulatory Visit: Payer: Self-pay | Admitting: Licensed Clinical Social Worker

## 2019-04-19 ENCOUNTER — Encounter: Payer: Self-pay | Admitting: Licensed Clinical Social Worker

## 2019-04-19 ENCOUNTER — Telehealth: Payer: Self-pay | Admitting: Licensed Clinical Social Worker

## 2019-04-19 DIAGNOSIS — C50912 Malignant neoplasm of unspecified site of left female breast: Secondary | ICD-10-CM

## 2019-04-19 DIAGNOSIS — Z8551 Personal history of malignant neoplasm of bladder: Secondary | ICD-10-CM

## 2019-04-19 DIAGNOSIS — Z803 Family history of malignant neoplasm of breast: Secondary | ICD-10-CM

## 2019-04-19 DIAGNOSIS — Z1379 Encounter for other screening for genetic and chromosomal anomalies: Secondary | ICD-10-CM

## 2019-04-19 DIAGNOSIS — Z8052 Family history of malignant neoplasm of bladder: Secondary | ICD-10-CM

## 2019-04-19 DIAGNOSIS — Z8042 Family history of malignant neoplasm of prostate: Secondary | ICD-10-CM

## 2019-04-19 DIAGNOSIS — Z808 Family history of malignant neoplasm of other organs or systems: Secondary | ICD-10-CM

## 2019-04-19 NOTE — Telephone Encounter (Signed)
Revealed negative genetic testing.  Revealed that a VUS in PDGFRA was identified. This normal result is reassuring and indicates that it is unlikely Alyssa Duncan's cancer is due to a hereditary cause.  It is unlikely that there is an increased risk of another cancer due to a mutation in one of these genes.  However, genetic testing is not perfect, and cannot definitively rule out a hereditary cause.  It will be important for her to keep in contact with genetics to learn if any additional testing may be needed in the future.

## 2019-04-19 NOTE — Progress Notes (Signed)
HPI:  Alyssa Duncan was previously seen in the [place] clinic due to a personal and family history ofcancer and concerns regarding a hereditary predisposition to cancer. Please refer to our prior cancer genetics clinic note for more information regarding our discussion, assessment and recommendations, at the time. Alyssa Duncan recent genetic test results were disclosed to her, as were recommendations warranted by these results. These results and recommendations are discussed in more detail below.  CANCER HISTORY:  Oncology History   No history exists.    FAMILY HISTORY:  We obtained a detailed, 4-generation family history.  Significant diagnoses are listed below: Family History  Problem Relation Age of Onset  . Breast cancer Mother 34  . Bladder Cancer Father   . Kidney Stones Brother   . Skin cancer Brother        worked on farm  . Prostate cancer Brother        dx late 42s  . Esophageal cancer Sister 69  . Skin cancer Sister   . Skin cancer Daughter   . Skin cancer Daughter   . Melanoma Daughter     [name] has 2 daughters and 1 son. One of her daughters had a melanoma removed from her back. Her other daughter has had skin cancer removed. Alyssa Duncan has a brother who also had skin cancers removed as well as prostate cancer diagnosed in his 18s. Alyssa Duncan had 1 sister who had esophageal cancer and passed recently at 70.  Alyssa Duncan mother was diagnosed with breast cancer at 35 and died at 70. Patient had 3 maternal aunts and 1 maternal uncle, no cancers. No cancers in maternal cousins. Maternal grandparents did not have cancer both died in their 25s.  Alyssa Duncan father was diagnosed with bladder cancer and also had history of skin cancers removed. He died at 51. Patient had 2 paternal aunts and 5 paternal uncles, no cancers she is aware of. No cancers in her paternal cousins, except for one cousin who does have a type of skin cancer on his face. Paternal grandparents did  not have cancer.  Alyssa Duncan is unaware of previous family history of genetic testing for hereditary cancer risks. Patient's maternal ancestors are of European descent, and paternal ancestors are of European descent. There is no reported Ashkenazi Jewish ancestry. There is no known consanguinity.  GENETIC TEST RESULTS: Genetic testing reported out on 04/18/2019 through the [facility] Common Hereditary Cancers Panel + Skin cancer panel found no pathogenic mutations.   The Common Hereditary Cancers Panel + Skin cancer panel offered by [facility] includes sequencing and/or deletion duplication testing of the following 54 genes: APC, ATM, AXIN2, BAP1, BARD1, BMPR1A, BRCA1, BRCA2, BRIP1, CDH1, CDK4, CDKN2A (p14ARF), CDKN2A (p16INK4a), CHEK2, CTNNA1, DICER1, EPCAM*, GREM1*, HOXB13, KIT, MEN1, MITF*, MLH1, MSH2, MSH3, MSH6, MUTYH, NBN, NF1, NTHL1, PALB2, PDGFRA, PMS2, POLD1, POLE, POT1, PTCH1, PTEN, RAD50, RAD51C, RAD51D, RB1, RNF43, SDHA*, SDHB, SDHC, SDHD, SMAD4, SMARCA4, STK11, SUFU, TP53, TSC1, TSC2, VHL.  The test report has been scanned into EPIC and is located under the Molecular Pathology section of the Results Review tab.  A portion of the result report is included below for reference.      We discussed with Alyssa Duncan that because current genetic testing is not perfect, it is possible there may be a gene mutation in one of these genes that current testing cannot detect, but that chance is small.  We also discussed, that there could be another gene that has  not yet been discovered, or that we have not yet tested, that is responsible for the cancer diagnoses in the family. It is also possible there is a hereditary cause for the cancer in the family that Alyssa Duncan did not inherit and therefore was not identified in her testing.  Therefore, it is important to remain in touch with cancer genetics in the future so that we can continue to offer Alyssa Duncan the most up to date genetic testing.   Genetic testing did  identify a variant of uncertain significance (VUS) was identified in the PDGFRA gene.  At this time, it is unknown if this variant is associated with increased cancer risk or if this is a normal finding, but most variants such as this get reclassified to being inconsequential. It should not be used to make medical management decisions. With time, we suspect the lab will determine the significance of this variant, if any. If we do learn more about it, we will try to contact Alyssa Duncan to discuss it further. However, it is important to stay in touch with [place] periodically and keep the address and phone number up to date.  ADDITIONAL GENETIC TESTING: We discussed with [name] that her genetic testing was fairly extensive.  If there are genes identified to increase cancer risk that can be analyzed in the future, we would be happy to discuss and coordinate this testing at that time.    CANCER SCREENING RECOMMENDATIONS: Alyssa Duncan test result is considered negative (normal).  This means that we have not identified a hereditary cause for her  personal and family history of cancer at this time. Most cancers happen by chance and this negative test suggests that her cancer may fall into this category.    While reassuring, this does not definitively rule out a hereditary predisposition to cancer. It is still possible that there could be genetic mutations that are undetectable by current technology. There could be genetic mutations in genes that have not been tested or identified to increase cancer risk.  Therefore, it is recommended she continue to follow the cancer management and screening guidelines provided by her oncology and primary healthcare provider.   An individual's cancer risk and medical management are not determined by genetic test results alone. Overall cancer risk assessment incorporates additional factors, including personal medical history, family history, and any available genetic information that may  result in a personalized plan for cancer prevention and surveillance.  RECOMMENDATIONS FOR FAMILY MEMBERS:  Relatives in this family might be at some increased risk of developing cancer, over the general population risk, simply due to the family history of cancer.  We recommended female relatives in this family have a yearly mammogram beginning at age 45, or 55 years younger than the earliest onset of cancer, an annual clinical breast exam, and perform monthly breast self-exams. Female relatives in this family should also have a gynecological exam as recommended by their primary provider. All family members should have a colonoscopy by age 75, or as directed by their physicians.  FOLLOW-UP: Lastly, we discussed with Ms. Gradel that cancer genetics is a rapidly advancing field and it is possible that new genetic tests will be appropriate for her and/or her family members in the future. We encouraged her to remain in contact with cancer genetics on an annual basis so we can update her personal and family histories and let her know of advances in cancer genetics that may benefit this family.   Our contact number was  provided. Ms. Soth questions were answered to her satisfaction, and she knows she is welcome to call us at anytime with additional questions or concerns.   Faith Rogue, MS, [facility] Genetic Counselor [email or web address] Phone: [phone]

## 2019-04-27 ENCOUNTER — Ambulatory Visit: Payer: Medicare PPO | Attending: Internal Medicine

## 2019-04-27 DIAGNOSIS — Z23 Encounter for immunization: Secondary | ICD-10-CM | POA: Insufficient documentation

## 2019-04-27 NOTE — Progress Notes (Signed)
   Covid-19 Vaccination Clinic  Name:  Alyssa Duncan    MRN: [id number] DOB: [birth year]  04/27/2019  [name] was observed post Covid-19 immunization for 15 minutes without incidence. She was provided with Vaccine Information Sheet and instruction to access the V-Safe system.   [name] was instructed to call 911 with any severe reactions post vaccine: Marland Kitchen Difficulty breathing  . Swelling of your face and throat  . A fast heartbeat  . A bad rash all over your body  . Dizziness and weakness    Immunizations Administered    Name Date Dose VIS Date Route   Pfizer COVID-19 Vaccine 04/27/2019  6:19 PM 0.3 mL 03/18/2019 Intramuscular   Manufacturer: [place]   Lot: [id number]   [place]: [id number]

## 2019-05-17 ENCOUNTER — Ambulatory Visit: Payer: Medicare PPO | Admitting: Urology

## 2019-05-17 ENCOUNTER — Encounter: Payer: Self-pay | Admitting: Urology

## 2019-05-17 ENCOUNTER — Other Ambulatory Visit: Payer: Self-pay

## 2019-05-17 VITALS — BP 152/77 | HR 73 | Ht 62.0 in | Wt 200.0 lb

## 2019-05-17 DIAGNOSIS — C679 Malignant neoplasm of bladder, unspecified: Secondary | ICD-10-CM

## 2019-05-17 LAB — MICROSCOPIC EXAMINATION

## 2019-05-17 LAB — URINALYSIS, COMPLETE
Bilirubin, UA: NEGATIVE
Glucose, UA: NEGATIVE
Ketones, UA: NEGATIVE
Leukocytes,UA: NEGATIVE
Nitrite, UA: POSITIVE — AB
Protein,UA: NEGATIVE
Specific Gravity, UA: 1.025 (ref 1.005–1.030)
Urobilinogen, Ur: 0.2 mg/dL (ref 0.2–1.0)
pH, UA: 7 (ref 5.0–7.5)

## 2019-05-17 NOTE — Progress Notes (Signed)
   05/17/2019   CC:  Chief Complaint  Patient presents with  . Cysto    HPI: 79 year old female admitted on 12/03/2015 with fevers, right flank pain found to have an obstructing right distal 5 mm ureteral stone. She was taken to the operating room for cystoscopy, right ureteral stent placement in the setting of worsening leukocytosis and uncontrolled pain.  Intraoperatively, an incidental bladder tumor was identified.    She returned to the operating room on 01/02/2016 for definitive management of her stone via ureteroscopy, laser lithotripsy followed by TURBT. 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  Cysto in office on 05/01/2016 showed a very small low-grade appearing recurrence ~2 mm, anterior bladder wall in close proximity to the bladder neck. She underwent cystoscopy, fulguration of this lesion in the office shortly thereafter which was well tolerated.    Patient recently had concern for possible early recurrence on 10/2017 with some subtle carpeting around the stellate scar as well as focal area of erythema.  She was taken to the operating room shortly thereafter at which time no pathology was identified.  Bilateral retrogrades were unremarkable.    She returns today for routine office cystoscopy.  She is excited about getting her next covid vaccine tomorrow.     Vitals:   05/17/19 1059  BP: (!) 152/77  Pulse: 73   Height 5\' 2"  (1.575 m), weight 203 lb (92.1 kg). NED. A&Ox3.   No respiratory distress   Abd soft, NT, ND Normal external genitalia with patent urethral meatus, significant cystocele with urethral caruncle, vaginal prolapse reduced for procedure  Cystoscopy/ Bladder lesion fulgeration procedure Note  Patient identification was confirmed, informed consent was obtained, and patient was prepped using Betadine solution.    Preoperative abx where received prior to  procedure.    Procedure: - Flexible cystoscope introduced, without any difficulty.   - Thorough search of the bladder revealed:    normal urethral meatus    normal urothelium with stellate scar on the right posterior lateral bladder wall.      Chronic inflammatory cystitis cystica type changes    no ulcers     no urethral polyps    no trabeculation  - Ureteral orifices were normal in position and appearance.  Mild subtle cobblestoning of prolapse portion of bladder including trigone- unchanged  Retroflexion showed no evidence of bladder tumor and bladder neck  Post-Procedure: - Patient tolerated the procedure well  Assessment/ Plan:  1. 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  2. History of kidney stones Currently asymptomatic.  RUS 07/2017 negative  F/u 6 months for cysto   Hollice Espy, MD

## 2019-05-18 ENCOUNTER — Ambulatory Visit: Payer: Medicare PPO | Attending: Internal Medicine

## 2019-05-18 DIAGNOSIS — Z23 Encounter for immunization: Secondary | ICD-10-CM | POA: Insufficient documentation

## 2019-05-18 NOTE — Progress Notes (Signed)
   Covid-19 Vaccination Clinic  Name:  Alyssa Duncan    MRN: [id number] DOB: 1940/10/19  05/18/2019  [name] was observed post Covid-19 immunization for 15 minutes without incidence. She was provided with Vaccine Information Sheet and instruction to access the V-Safe system.   [name] was instructed to call 911 with any severe reactions post vaccine: Marland Kitchen Difficulty breathing  . Swelling of your face and throat  . A fast heartbeat  . A bad rash all over your body  . Dizziness and weakness    Immunizations Administered    Name Date Dose VIS Date Route   Pfizer COVID-19 Vaccine 05/18/2019  9:23 AM 0.3 mL 03/18/2019 Intramuscular   Manufacturer: [place]   Lot: [id number]   [place]: [id number]

## 2019-05-20 ENCOUNTER — Other Ambulatory Visit: Payer: Self-pay | Admitting: Urology

## 2019-09-29 ENCOUNTER — Other Ambulatory Visit: Payer: Self-pay | Admitting: Orthopedic Surgery

## 2019-09-29 DIAGNOSIS — G8929 Other chronic pain: Secondary | ICD-10-CM

## 2019-10-19 ENCOUNTER — Ambulatory Visit: Payer: Medicare PPO

## 2019-10-26 ENCOUNTER — Other Ambulatory Visit: Payer: Self-pay

## 2019-10-26 ENCOUNTER — Ambulatory Visit
Admission: RE | Admit: 2019-10-26 | Discharge: 2019-10-26 | Disposition: A | Discharge status: Home / Self Care | Payer: Medicare PPO | Source: Ambulatory Visit | Attending: Orthopedic Surgery | Admitting: Orthopedic Surgery

## 2019-10-26 DIAGNOSIS — G8929 Other chronic pain: Secondary | ICD-10-CM | POA: Insufficient documentation

## 2019-10-26 DIAGNOSIS — M545 Low back pain: Secondary | ICD-10-CM | POA: Diagnosis not present

## 2019-11-14 NOTE — Progress Notes (Signed)
11/15/2019  CC:  Chief Complaint  Patient presents with  . Cysto    HPI: Alyssa Duncan is a 79 y.o. 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   Patient was admitted on 12/03/2015 with fevers, right flank pain found to have an obstructing right distal 5 mm ureteral stone. She was taken to the operating room for cystoscopy, right ureteral stent placement in the setting of worsening leukocytosis and uncontrolled pain. Intraoperatively, an incidental bladder tumor was identified.   She returned to the operating room on 01/02/2016 for definitive management of her stone via ureteroscopy, laser lithotripsy followed by TURBT. Surgical pathology was consistent with low-grade noninvasive bladder cancer. 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  Cysto in office on 05/01/2016 showed a very small low-grade appearing recurrence ~2 mm, anterior bladder wall in close proximity to the bladder neck. She underwent cystoscopy, fulguration of this lesion in the office shortly thereafter which was well tolerated.    Patient recently had concern for possible early recurrence on 10/2017 with some subtle carpeting around the stellate scar as well as focal area of erythema.  She was taken to the operating room shortly thereafter at which time no pathology was identified.  Bilateral retrogrades were unremarkable.     Routine UA on 08/31/2019 showed microscopic blood, nitrite positive, 4-10 WBC and many bacteria.   Last cysto 05/17/19 with some subtle nonspecific inflammatory- stable.   Today she has adequate voiding. No hematuria.   Blood pressure (!) 145/76, pulse 80. NED. A&Ox3.   No respiratory distress   Abd soft, NT, ND Significant pelvic organ prolapse with cervix beyond the introitus, reduced to introduce scope today  Cystoscopy Procedure Note  Patient identification was confirmed,  informed consent was obtained, and patient was prepped using Betadine solution.  Lidocaine jelly was administered per urethral meatus.    Procedure: - Flexible cystoscope introduced, without any difficulty.   - Thorough search of the bladder revealed:    normal urethral meatus    normal urothelium with stellate scar on the right posterior lateral bladder wall.       Chronic inflammatory cystitis cystica type changes- stable    no stones    no ulcers     no tumors    no urethral polyps    no trabeculation  - Ureteral orifices were normal in position and appearance. Mild subtle cobblestoning of prolapse portion of bladder including trigone- unchanged  Retroflexion showed no evidence of bladder tumor and bladder neck.  Post-Procedure: - Patient tolerated the procedure well  Urinalysis  Shows leukocytes and bacteria Dipstick shows nitrite positive with many bacteria  Assessment/ Plan:  1. Malignant neoplasm of anterior wall of urinary bladder H/o incidental bladder tumor, LgTa 1 cm s/p TURBT 12/2015 Small low-grade appearing recurrence 04/2016 NED Patient has adequate emptying. Cysto today stable Continue q6 months cysto secondary to mild inflammation. Patient agreed.  UA today looks possibly contaminated. Dipstick shows nitrite positive with many bacteria but no RBCs or WBCs Asymptomatic. There is no concern for infection.  -Urine culture sent as precaution   2. History of kidney stones Asymptomatic  RUS 07/2017 negative  F/u cyst in 6 months  I, Alyssa Duncan, am acting as a scribe for Dr. Hollice Espy.  I have reviewed the above documentation for accuracy and completeness, and I agree with the above.   Hollice Espy, MD

## 2019-11-15 ENCOUNTER — Other Ambulatory Visit: Payer: Self-pay

## 2019-11-15 ENCOUNTER — Ambulatory Visit: Payer: Medicare PPO | Admitting: Urology

## 2019-11-15 VITALS — BP 145/76 | HR 80

## 2019-11-15 DIAGNOSIS — C679 Malignant neoplasm of bladder, unspecified: Secondary | ICD-10-CM

## 2019-11-15 LAB — URINALYSIS, COMPLETE
Bilirubin, UA: NEGATIVE
Glucose, UA: NEGATIVE
Ketones, UA: NEGATIVE
Nitrite, UA: POSITIVE — AB
Protein,UA: NEGATIVE
Specific Gravity, UA: 1.02 (ref 1.005–1.030)
Urobilinogen, Ur: 0.2 mg/dL (ref 0.2–1.0)
pH, UA: 7 (ref 5.0–7.5)

## 2019-11-15 LAB — MICROSCOPIC EXAMINATION

## 2019-11-21 LAB — CULTURE, URINE COMPREHENSIVE

## 2019-11-22 ENCOUNTER — Telehealth: Payer: Self-pay | Admitting: *Deleted

## 2019-11-22 NOTE — Telephone Encounter (Addendum)
Patient remains asymptomatic. Denies any changes. Voiced understanding.    ----- Message from Hollice Espy, MD sent at 11/21/2019  4:59 PM EDT ----- Please check with this patient  to ensures that she remains asymptomatic (ie no UTI symptoms).  She was asymptomatic at the time of urine culture.  If she is asymptomatic, will not treat.  Otherwise treat with Keflex 500 mg 3 times a day for 5 days.  Hollice Espy, MD

## 2019-12-13 ENCOUNTER — Ambulatory Visit: Payer: Medicare Other | Admitting: Oncology

## 2019-12-13 ENCOUNTER — Other Ambulatory Visit: Payer: Medicare Other

## 2019-12-19 ENCOUNTER — Inpatient Hospital Stay: Payer: Medicare PPO | Attending: Oncology

## 2019-12-19 ENCOUNTER — Encounter: Payer: Self-pay | Admitting: Oncology

## 2019-12-19 ENCOUNTER — Other Ambulatory Visit: Payer: Self-pay

## 2019-12-19 ENCOUNTER — Inpatient Hospital Stay: Payer: Medicare PPO | Admitting: Oncology

## 2019-12-19 VITALS — BP 145/72 | HR 64 | Temp 96.7°F | Resp 16 | Wt 200.3 lb

## 2019-12-19 DIAGNOSIS — N1831 Chronic kidney disease, stage 3a: Secondary | ICD-10-CM | POA: Insufficient documentation

## 2019-12-19 DIAGNOSIS — Z8042 Family history of malignant neoplasm of prostate: Secondary | ICD-10-CM | POA: Diagnosis not present

## 2019-12-19 DIAGNOSIS — Z79899 Other long term (current) drug therapy: Secondary | ICD-10-CM | POA: Insufficient documentation

## 2019-12-19 DIAGNOSIS — D0512 Intraductal carcinoma in situ of left breast: Secondary | ICD-10-CM

## 2019-12-19 DIAGNOSIS — R7401 Elevation of levels of liver transaminase levels: Secondary | ICD-10-CM

## 2019-12-19 DIAGNOSIS — Z803 Family history of malignant neoplasm of breast: Secondary | ICD-10-CM | POA: Insufficient documentation

## 2019-12-19 DIAGNOSIS — D631 Anemia in chronic kidney disease: Secondary | ICD-10-CM

## 2019-12-19 DIAGNOSIS — Z923 Personal history of irradiation: Secondary | ICD-10-CM | POA: Diagnosis not present

## 2019-12-19 DIAGNOSIS — Z7982 Long term (current) use of aspirin: Secondary | ICD-10-CM | POA: Insufficient documentation

## 2019-12-19 DIAGNOSIS — Z8551 Personal history of malignant neoplasm of bladder: Secondary | ICD-10-CM

## 2019-12-19 DIAGNOSIS — Z86000 Personal history of in-situ neoplasm of breast: Secondary | ICD-10-CM | POA: Diagnosis not present

## 2019-12-19 DIAGNOSIS — I4891 Unspecified atrial fibrillation: Secondary | ICD-10-CM | POA: Diagnosis not present

## 2019-12-19 DIAGNOSIS — J45909 Unspecified asthma, uncomplicated: Secondary | ICD-10-CM | POA: Diagnosis not present

## 2019-12-19 LAB — CBC WITH DIFFERENTIAL/PLATELET
Abs Immature Granulocytes: 0.03 10*3/uL (ref 0.00–0.07)
Basophils Absolute: 0 10*3/uL (ref 0.0–0.1)
Basophils Relative: 1 %
Eosinophils Absolute: 0.5 10*3/uL (ref 0.0–0.5)
Eosinophils Relative: 6 %
HCT: 44.3 % (ref 36.0–46.0)
Hemoglobin: 14.9 g/dL (ref 12.0–15.0)
Immature Granulocytes: 0 %
Lymphocytes Relative: 15 %
Lymphs Abs: 1.2 10*3/uL (ref 0.7–4.0)
MCH: 31.3 pg (ref 26.0–34.0)
MCHC: 33.6 g/dL (ref 30.0–36.0)
MCV: 93.1 fL (ref 80.0–100.0)
Monocytes Absolute: 0.6 10*3/uL (ref 0.1–1.0)
Monocytes Relative: 8 %
Neutro Abs: 5.6 10*3/uL (ref 1.7–7.7)
Neutrophils Relative %: 70 %
Platelets: 262 10*3/uL (ref 150–400)
RBC: 4.76 MIL/uL (ref 3.87–5.11)
RDW: 13.2 % (ref 11.5–15.5)
WBC: 7.9 10*3/uL (ref 4.0–10.5)
nRBC: 0 % (ref 0.0–0.2)

## 2019-12-19 LAB — COMPREHENSIVE METABOLIC PANEL
ALT: 54 U/L — ABNORMAL HIGH (ref 0–44)
AST: 51 U/L — ABNORMAL HIGH (ref 15–41)
Albumin: 4.1 g/dL (ref 3.5–5.0)
Alkaline Phosphatase: 75 U/L (ref 38–126)
Anion gap: 12 (ref 5–15)
BUN: 18 mg/dL (ref 8–23)
CO2: 30 mmol/L (ref 22–32)
Calcium: 9.2 mg/dL (ref 8.9–10.3)
Chloride: 99 mmol/L (ref 98–111)
Creatinine, Ser: 1.06 mg/dL — ABNORMAL HIGH (ref 0.44–1.00)
GFR calc Af Amer: 57 mL/min — ABNORMAL LOW (ref >60)
GFR calc non Af Amer: 49 mL/min — ABNORMAL LOW (ref >60)
Glucose, Bld: 150 mg/dL — ABNORMAL HIGH (ref 70–99)
Potassium: 3.6 mmol/L (ref 3.5–5.1)
Sodium: 141 mmol/L (ref 135–145)
Total Bilirubin: 0.8 mg/dL (ref 0.3–1.2)
Total Protein: 7.7 g/dL (ref 6.5–8.1)

## 2019-12-19 NOTE — Progress Notes (Signed)
Patient denies new problems/concerns today.   °

## 2019-12-19 NOTE — Progress Notes (Signed)
[name] day:  12/15/2017  Chief Complaint: Alyssa Duncan is a 79 y.o. 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 Mike Duncan.  She switched care to me on 12/17/2018. 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  Pathology revealed a 3.6 cm area of grade II ductal carcinoma in situ. Margins were clear but close at 1 mm.  DCIS was ER + (> 90%), PR + (> 90%).  Pathologic stage was TisNx.  Status post adjuvant radiation which was completed in 06/2012.    Antiestrogen treatments : she was initially treated with Femara, but discontinued secondary to joint pains.  She tried tamoxifen, but discontinued secondary to swelling.  She began Aromasin since 11/16/2012.  Stopped Aromasin in September 2019 after finished total of 5 years of antiestrogen treatments.  Bilateral diagnostic mammogram on 03/24/2016 revealed no evidence of malignancy.  Diagnostic left mammogram and ultrasound on 06/09/2016 revealed an irregular hypoechogenicity in the left breast at 6 o'clock 2 cm from the nipple measuring 9 x 7 x 10 mm. This was felt likely to represent post treatment change but underlying malignancy could not be excluded.  Ultrasound of the left axilla revealed no enlarged adenopathy.   Ultrasound guided biopsy on 06/19/2016 revealed changes c/w organizing fat necrosis.  Bilateral diagnostic mammogram on 03/13/2017 that revealed a suspicious 6 mm mass in the superficial upper outer left breast.  Biopsy on 03/24/2017 results revealed fat necrosis with dystrophic calcification.   Bone density on 08/22/2015 was normal with a T-score of -1.0 in the left femur.  Bone density on 08/25/2017 was normal with a T-score of -0.8 in the femoral neck.  #, 2017, history of non-muscle invasive bladder cancer.  She  underwent TURBT on 01/02/2016.  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  Cystoscopy on 05/01/2016 revealed a very small low-grade appearing recurrence.  She underwent cystoscopy and fulguration on 05/06/2016.  Cystoscopy on 04/29/2017 revealed no evidence of disease.  Next cystoscopy planned for 07/28/2017.  # She has stopped Arimidex per Dr. Kem Duncan recommendation after being seen in September 2020  [name] is a 79 y.o. female who has above history reviewed by me today presents for follow up visit for management of history of DCIS Problems and complaints are listed below: She finished 5 years of endocrine therapy. Patient reports feeling well.  Denies any breast concerns. Denies any fever, chills, nausea, vomiting, chest pain, abdominal pain.  Past Medical History:  Diagnosis Date  . Acute biliary pancreatitis   . Arthritis   . Asthma   . Back pain   . Bladder cancer ([place])   . Breast cancer ([place]) 2013   left breast ductal and lobular carcinoma  . Choledocholithiasis   . Difficult intubation   . Dysrhythmia 12/2015   A-fib  . Family history of adverse reaction to anesthesia    dad had hard time waking up from anesthesia  . Family history of bladder cancer   . Family history of breast cancer   . Family history of prostate cancer   . Family history of skin cancer   . Gallstone pancreatitis 05/13/2017  . GERD (gastroesophageal reflux disease)   . History of kidney stones   . Hyperlipidemia   . Hypertension   . Hypothyroidism   .  Kidney stones 12/2015  . Personal history of radiation therapy 2013   BREAST CA  . Shingles   . Skin cancer 2005   left arm and nose  . Sleep apnea     Past Surgical History:  Procedure Laterality Date  . BREAST BIOPSY Left 2013   POS  . BREAST BIOPSY Left 2018   benign fat necrosis  . BREAST LUMPECTOMY Left 2013   with rad tx  . CARPAL TUNNEL RELEASE    . CATARACT EXTRACTION    .  CHOLECYSTECTOMY N/A 05/15/2017   Procedure: LAPAROSCOPIC CHOLECYSTECTOMY WITH INTRAOPERATIVE CHOLANGIOGRAM;  Surgeon: Alyssa Epley, MD;  Location: [facility] ORS;  Service: General;  Laterality: N/A;  . COLONOSCOPY WITH PROPOFOL N/A 05/10/2015   Procedure: COLONOSCOPY WITH PROPOFOL;  Surgeon: Alyssa Luster, MD;  Location: [facility] ENDOSCOPY;  Service: Gastroenterology;  Laterality: N/A;  . CYSTOSCOPY W/ RETROGRADES Bilateral 01/02/2016   Procedure: CYSTOSCOPY WITH RETROGRADE PYELOGRAM;  Surgeon: Alyssa Espy, MD;  Location: [facility] ORS;  Service: Urology;  Laterality: Bilateral;  . CYSTOSCOPY W/ RETROGRADES Bilateral 11/04/2017   Procedure: CYSTOSCOPY WITH RETROGRADE PYELOGRAM;  Surgeon: Alyssa Espy, MD;  Location: [facility] ORS;  Service: Urology;  Laterality: Bilateral;  . CYSTOSCOPY W/ URETERAL STENT PLACEMENT Right 01/02/2016   Procedure: CYSTOSCOPY WITH STENT REPLACEMENT;  Surgeon: Alyssa Espy, MD;  Location: [facility] ORS;  Service: Urology;  Laterality: Right;  . CYSTOSCOPY WITH STENT PLACEMENT Right 12/04/2015   Procedure: CYSTOSCOPY WITH STENT PLACEMENT;  Surgeon: Alyssa Espy, MD;  Location: [facility] ORS;  Service: Urology;  Laterality: Right;  . ENDOSCOPIC RETROGRADE CHOLANGIOPANCREATOGRAPHY (ERCP) WITH PROPOFOL N/A 05/14/2017   Procedure: ENDOSCOPIC RETROGRADE CHOLANGIOPANCREATOGRAPHY (ERCP) WITH PROPOFOL;  Surgeon: Alyssa Lame, MD;  Location: [facility] ENDOSCOPY;  Service: Endoscopy;  Laterality: N/A;  . JOINT REPLACEMENT Left 2013   hip  . KNEE ARTHROSCOPY    . SEPTOPLASTY    . TRANSURETHRAL RESECTION OF BLADDER TUMOR WITH MITOMYCIN-C N/A 01/02/2016   Procedure: TRANSURETHRAL RESECTION OF BLADDER TUMOR WITH MITOMYCIN-C;  Surgeon: Alyssa Espy, MD;  Location: [facility] ORS;  Service: Urology;  Laterality: N/A;  . URETEROSCOPY WITH HOLMIUM LASER LITHOTRIPSY Right 01/02/2016   Procedure: URETEROSCOPY WITH HOLMIUM LASER LITHOTRIPSY;  Surgeon: Alyssa Espy, MD;  Location: [facility] ORS;  Service: Urology;  Laterality:  Right;  . UVULOPALATOPHARYNGOPLASTY     and tongue surgery    Family History  Problem Relation Age of Onset  . Breast cancer Mother 25  . Bladder Cancer Father   . Kidney Stones Brother   . Skin cancer Brother        worked on farm  . Prostate cancer Brother        dx late 27s  . Esophageal cancer Sister 46  . Skin cancer Sister   . Skin cancer Daughter   . Skin cancer Daughter   . Melanoma Daughter     Social History:  reports that she has never smoked. She has never used smokeless tobacco. She reports that she does not drink alcohol and does not use drugs.  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  She lives in [place].  The patient is alone today.  Allergies:  Allergies  Allergen Reactions  . Ativan [Lorazepam] Other (See Comments)    Hysteria   . Cardura [Doxazosin] Other (See Comments)    unsure  . Clonidine Derivatives Other (See Comments)    unsure  . Enalapril Other (See Comments)    unsure  . Levaquin [Levofloxacin] Other (See Comments)    Couldn't raise her  arms  . Mobic [Meloxicam] Other (See Comments)    unsure  . Sulfa Antibiotics Nausea Only  . Vicodin [Hydrocodone-Acetaminophen] Other (See Comments)    unsure    Current Medications: Current Outpatient Medications  Medication Sig Dispense Refill  . acetaminophen (TYLENOL) 500 MG tablet Take 1,000 mg by mouth 2 (two) times daily.    Marland Kitchen amLODipine (NORVASC) 10 MG tablet Take by mouth.    Marland Kitchen aspirin EC 81 MG tablet Take 1 tablet (81 mg total) by mouth daily. 90 tablet 3  . Calcium Carb-Cholecalciferol (CALCIUM 600+D3) 600-800 MG-UNIT TABS Take 1 tablet by mouth 2 (two) times daily.    Marland Kitchen docusate sodium (COLACE) 50 MG capsule Take 50 mg by mouth daily.    . dorzolamide-timolol (COSOPT) 22.3-6.8 MG/ML ophthalmic solution Place 1 drop into both eyes 2 (two) times daily.     . furosemide (LASIX) 20 MG tablet Take 20 mg by mouth daily.    . hydrochlorothiazide (HYDRODIURIL) 25 MG tablet Take 25 mg by mouth daily.    Marland Kitchen  ibuprofen (ADVIL,MOTRIN) 200 MG tablet Take 400-600 mg by mouth every 6 (six) hours as needed for headache or moderate pain.     Marland Kitchen levothyroxine (SYNTHROID, LEVOTHROID) 75 MCG tablet Take 75 mcg by mouth daily before breakfast.    . loratadine (CLARITIN) 10 MG tablet Take 10 mg by mouth daily.    Marland Kitchen losartan (COZAAR) 100 MG tablet Take 100 mg by mouth every morning.     . lovastatin (MEVACOR) 40 MG tablet Take 40 mg by mouth at bedtime.    Marland Kitchen LUMIGAN 0.01 % SOLN Place 1 drop into both eyes at bedtime.     . metoprolol tartrate (LOPRESSOR) 100 MG tablet Take 100 mg by mouth 2 (two) times daily.    . pantoprazole (PROTONIX) 40 MG tablet Take 40 mg by mouth at bedtime.     . potassium chloride SA (K-DUR,KLOR-CON) 20 MEQ tablet Take 20 mEq by mouth 2 (two) times daily.    . vitamin B-12 (CYANOCOBALAMIN) 1000 MCG tablet Take 1,000 mcg by mouth daily.    . vitamin C (ASCORBIC ACID) 500 MG tablet Take 500 mg by mouth daily.     Current Facility-Administered Medications  Medication Dose Route Frequency Provider Last Rate Last Admin  . lidocaine (XYLOCAINE) 2 % jelly 1 application  1 application Urethral Once Alyssa Espy, MD       Review of Systems  Constitutional: Negative for appetite change, chills, fatigue and fever.  HENT:   Negative for hearing loss and voice change.   Eyes: Negative for eye problems.  Respiratory: Negative for chest tightness and cough.   Cardiovascular: Negative for chest pain.  Gastrointestinal: Negative for abdominal distention, abdominal pain and blood in stool.  Endocrine: Negative for hot flashes.  Genitourinary: Negative for difficulty urinating and frequency.   Skin: Negative for itching and rash.  Neurological: Negative for extremity weakness.  Hematological: Negative for adenopathy.  Psychiatric/Behavioral: Negative for confusion.     Physical Exam: Blood pressure (!) 145/72, pulse 64, temperature (!) 96.7 F (35.9 C), resp. rate 16, weight 200 lb 4.8 oz  (90.9 kg).  Physical Exam Constitutional:      General: She is not in acute distress.    Appearance: She is not diaphoretic.     Comments: Patient walks independently  HENT:     Head: Normocephalic and atraumatic.     Nose: Nose normal.     Mouth/Throat:     Pharynx: No  oropharyngeal exudate.  Eyes:     General: No scleral icterus.    Pupils: Pupils are equal, round, and reactive to light.  Cardiovascular:     Rate and Rhythm: Normal rate and regular rhythm.     Heart sounds: No murmur heard.   Pulmonary:     Effort: Pulmonary effort is normal. No respiratory distress.     Breath sounds: No rales.  Chest:     Chest wall: No tenderness.  Abdominal:     General: There is no distension.     Palpations: Abdomen is soft.     Tenderness: There is no abdominal tenderness.  Musculoskeletal:        General: Normal range of motion.     Cervical back: Normal range of motion and neck supple.  Skin:    General: Skin is warm and dry.     Findings: No erythema.  Neurological:     Mental Status: She is alert and oriented to person, place, and time.     Cranial Nerves: No cranial nerve deficit.     Motor: No abnormal muscle tone.     Coordination: Coordination normal.  Psychiatric:        Mood and Affect: Affect normal.    Breast exam was performed in seated and lying down position. Patient is status post left lumpectomy with a well-healed surgical scar.  Left breast post-operative changes between 6 o'clock and 6:30 position.  Left nipple indentation with scar.   No palpable masses or axillary adenopathy bilaterally.    No visits with results within 3 Day(s) from this visit.  Latest known visit with results is:  Procedure visit on 11/15/2019  Component Date Value Ref Range Status  . Specific Gravity, UA 11/15/2019 1.020  1.005 - 1.030 Final  . pH, UA 11/15/2019 7.0  5.0 - 7.5 Final  . Color, UA 11/15/2019 Yellow  Yellow Final  . Appearance Ur 11/15/2019 Cloudy* Clear Final  .  Leukocytes,UA 11/15/2019 Trace* Negative Final  . Protein,UA 11/15/2019 Negative  Negative/Trace Final  . Glucose, UA 11/15/2019 Negative  Negative Final  . Ketones, UA 11/15/2019 Negative  Negative Final  . RBC, UA 11/15/2019 Trace* Negative Final  . Bilirubin, UA 11/15/2019 Negative  Negative Final  . Urobilinogen, Ur 11/15/2019 0.2  0.2 - 1.0 mg/dL Final  . Nitrite, UA 11/15/2019 Positive* Negative Final  . Microscopic Examination 11/15/2019 See below:   Final  . Urine Culture, Comprehensive 11/15/2019 Final report*  Final  . Organism ID, Bacteria 11/15/2019 Comment*  Final   Comment: Staphylococcus epidermidis Greater than 100,000 colony forming units per mL Based on susceptibility to oxacillin this isolate would be susceptible to: *Penicillinase-stable penicillins, such as:   Cloxacillin, Dicloxacillin, Nafcillin *Beta-lactam combination agents, such as:   Amoxicillin-clavulanic acid, Ampicillin-sulbactam,   Piperacillin-tazobactam *Oral cephems, such as:   Cefaclor, Cefdinir, Cefpodoxime, Cefprozil, Cefuroxime,   Cephalexin, Loracarbef *Parenteral cephems, such as:   Cefazolin, Cefepime, Cefotaxime, Cefotetan, Ceftaroline,   Ceftizoxime, Ceftriaxone, Cefuroxime *Carbapenems, such as:   Doripenem, Ertapenem, Imipenem, Meropenem   . ANTIMICROBIAL SUSCEPTIBILITY 11/15/2019 Comment   Final   Comment:       ** S = Susceptible; I = Intermediate; R = Resistant **                    P = Positive; N = Negative             MICS are expressed in micrograms per mL    Antibiotic  RSLT#1    RSLT#2    RSLT#3    RSLT#4 Ciprofloxacin                  S Gentamicin                     S Levofloxacin                   S Linezolid                      S Moxifloxacin                   S Nitrofurantoin                 S Oxacillin                      S Penicillin                     R Quinupristin/Dalfopristin      S Rifampin                       S Tetracycline                    S Trimethoprim/Sulfa             S Vancomycin                     S   . WBC, UA 11/15/2019 0-5  0 - 5 /hpf Final  . RBC 11/15/2019 0-2  0 - 2 /hpf Final  . Epithelial Cells (non renal) 11/15/2019 0-10  0 - 10 /hpf Final  . Renal Epithel, UA 11/15/2019 0-10* None seen /hpf Final  . Crystals 11/15/2019 Present* N/A Final  . Crystal Type 11/15/2019 Amorphous Sediment  N/A Final  . Bacteria, UA 11/15/2019 Many* None seen/Few Final    Assessment:  Alyssa Duncan is a 79 y.o. female presents for DCIS follow-up.  1. Ductal carcinoma in situ (DCIS) of left breast   2. History of bladder cancer   3. 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 Transaminitis    #Left breast DCIS, patient is doing very well clinically. She finished 5 years of antiestrogen treatments. 03/17/2019 bilateral diagnostic mammogram showed no evidence of malignancy in either breast.  Biopsy-proven benign fat necrosis in the left breast.  Patient will need bilateral screening mammogram in December 2021.  I will obtain. Patient prefers to do his 1 year follow-up next year in December.  She follows up with primary care provider in May and October every year.  History of noninvasive bladder cancer, continue follow-up with urology.   Patient follow-up with urology Dr. Erlene Quan.  And was seen by urology in August 2021.  Chronic kidney disease creatinine 1.06, estimated GFR 49.  Stage III.  I recommend patient to avoid nephrotoxins.  Patient takes ibuprofen 3 times per week.  Advised patient to use Tylenol.  Further discussion with primary care provider if Tylenol does not relieve her pain.  Transaminitis, mild, probably due to being on statin.  I encourage patient to further discuss with Dr. Caryl Comes at the next visit.  RTC in 1 year for MD assessment and labs (CBC with diff, CMP).  We spent sufficient time to discuss many aspect of care, questions were answered to patient's satisfaction.  Earlie Server, MD  12/15/2017,  8:43 AM

## 2020-03-20 ENCOUNTER — Other Ambulatory Visit: Payer: Self-pay

## 2020-03-20 ENCOUNTER — Ambulatory Visit
Admission: RE | Admit: 2020-03-20 | Discharge: 2020-03-20 | Disposition: A | Discharge status: Home / Self Care | Payer: Medicare PPO | Source: Ambulatory Visit | Attending: Oncology | Admitting: Oncology

## 2020-03-20 DIAGNOSIS — D0512 Intraductal carcinoma in situ of left breast: Secondary | ICD-10-CM

## 2020-03-20 DIAGNOSIS — Z853 Personal history of malignant neoplasm of breast: Secondary | ICD-10-CM | POA: Insufficient documentation

## 2020-03-20 DIAGNOSIS — Z1231 Encounter for screening mammogram for malignant neoplasm of breast: Secondary | ICD-10-CM | POA: Insufficient documentation

## 2020-05-16 ENCOUNTER — Encounter: Payer: Self-pay | Admitting: Urology

## 2020-05-16 ENCOUNTER — Ambulatory Visit: Payer: Medicare PPO | Admitting: Urology

## 2020-05-16 ENCOUNTER — Other Ambulatory Visit: Payer: Self-pay

## 2020-05-16 VITALS — BP 118/63 | HR 61 | Ht 62.0 in | Wt 200.0 lb

## 2020-05-16 DIAGNOSIS — C679 Malignant neoplasm of bladder, unspecified: Secondary | ICD-10-CM | POA: Diagnosis not present

## 2020-05-16 LAB — URINALYSIS, COMPLETE
Bilirubin, UA: NEGATIVE
Glucose, UA: NEGATIVE
Ketones, UA: NEGATIVE
Leukocytes,UA: NEGATIVE
Nitrite, UA: POSITIVE — AB
Protein,UA: NEGATIVE
Specific Gravity, UA: 1.02 (ref 1.005–1.030)
Urobilinogen, Ur: 0.2 mg/dL (ref 0.2–1.0)
pH, UA: 7.5 (ref 5.0–7.5)

## 2020-05-16 LAB — MICROSCOPIC EXAMINATION

## 2020-05-16 NOTE — Progress Notes (Signed)
   05/16/20   CC:  Chief Complaint  Patient presents with  . Cysto    HPI: Alyssa Duncan is a 80 y.o. 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   Patient was admitted on 12/03/2015 with fevers, right flank pain found to have an obstructing right distal 5 mm ureteral stone. She was taken to the operating room for cystoscopy, right ureteral stent placement in the setting of worsening leukocytosis and uncontrolled pain. Intraoperatively, an incidental bladder tumor was identified.   She returned to the operating room on 01/02/2016 for definitive management of her stone via ureteroscopy, laser lithotripsy followed by TURBT. Surgical pathology was consistent with low-grade noninvasive bladder cancer. 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  Cysto in office on 05/01/2016 showed a very small low-grade appearing recurrence ~2 mm, anterior bladder wall in close proximity to the bladder neck. She underwent cystoscopy, fulguration of this lesion in the office shortly thereafter which was well tolerated.    Patient recently had concern for possible early recurrence on 10/2017 with some subtle carpeting around the stellate scar as well as focal area of erythema.  She was taken to the operating room shortly thereafter at which time no pathology was identified.  Bilateral retrogrades were unremarkable.     She has no urinary complaints today.  No gross hematuria.  She wonders if she should finally have her prolapse addressed but she is having no symptoms from this.  Blood pressure 118/63, pulse 61, height 5\' 2"  (1.575 m), weight 200 lb (90.7 kg). NED. A&Ox3.   No respiratory distress   Abd soft, NT, ND Significant pelvic organ prolapse with cervix beyond the introitus, reduced to introduce scope today  Cystoscopy Procedure Note  Patient identification was confirmed, informed consent  was obtained, and patient was prepped using Betadine solution.  Lidocaine jelly was administered per urethral meatus.    Procedure: - Flexible cystoscope introduced, without any difficulty.   - Thorough search of the bladder revealed:    normal urethral meatus    normal urothelium with stellate scar on the right posterior lateral bladder wall.       Chronic inflammatory cystitis cystica type changes- stable    no stones    no ulcers     no tumors    no urethral polyps    no trabeculation  - Ureteral orifices were normal in position and appearance. Mild subtle cobblestoning of prolapse portion of bladder including trigone- unchanged  Retroflexion showed no evidence of bladder tumor and bladder neck.  Post-Procedure: - Patient tolerated the procedure well  Urinalysis  Shows leukocytes and bacteria Dipstick shows nitrite positive with many bacteria  Assessment/ Plan:  1. Malignant neoplasm of anterior wall of urinary bladder H/o incidental bladder tumor, LgTa 1 cm s/p TURBT 12/2015 Small low-grade appearing recurrence 04/2016 NED Patient has adequate emptying. Cysto today stable Continue q6 months cysto secondary to mild inflammation. Patient agreed.  If her cystoscopy remained stable at next visit, will transition to annual.  She scribble this time.  2. History of kidney stones Asymptomatic  RUS 07/2017 negative  F/u cysto in 6 months    Hollice Espy, MD

## 2020-11-13 ENCOUNTER — Other Ambulatory Visit: Payer: Self-pay

## 2020-11-13 ENCOUNTER — Ambulatory Visit: Payer: Medicare PPO | Admitting: Urology

## 2020-11-13 ENCOUNTER — Encounter: Payer: Self-pay | Admitting: Urology

## 2020-11-13 DIAGNOSIS — C679 Malignant neoplasm of bladder, unspecified: Secondary | ICD-10-CM

## 2020-11-13 NOTE — Progress Notes (Signed)
   11/13/20   CC:  Chief Complaint  Patient presents with   Cysto    HPI: SHANDEL BRIEGEL is a 80 y.o. 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   Patient was admitted on 12/03/2015 with fevers, right flank pain found to have an obstructing right distal 5 mm ureteral stone. She was taken to the operating room for cystoscopy, right ureteral stent placement in the setting of worsening leukocytosis and uncontrolled pain.  Intraoperatively, an incidental bladder tumor was identified.     She returned to the operating room on 01/02/2016 for definitive management of her stone via ureteroscopy, laser lithotripsy followed by TURBT. Surgical pathology was consistent with low-grade noninvasive bladder cancer. 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   Cysto in office on 05/01/2016 showed a very small low-grade appearing recurrence ~2 mm, anterior bladder wall in close proximity to the bladder neck. She underwent cystoscopy, fulguration of this lesion in the office shortly thereafter which was well tolerated.     Patient recently had concern for possible early recurrence on 10/2017 with some subtle carpeting around the stellate scar as well as focal area of erythema.  She was taken to the operating room shortly thereafter at which time no pathology was identified.  Bilateral retrogrades were unremarkable.     She has no urinary complaints today.  No gross hematuria.  Blood pressure (!) 148/71, pulse 61, height '5\' 2"'$  (1.575 m), weight 200 lb (90.7 kg). NED. A&Ox3.   No respiratory distress   Abd soft, NT, ND Significant pelvic organ prolapse with cervix beyond the introitus, reduced to introduce scope today  Cystoscopy Procedure Note  Patient identification was confirmed, informed consent was obtained, and patient was prepped using Betadine solution.  Lidocaine jelly was administered per  urethral meatus.    Procedure: - Flexible cystoscope introduced, without any difficulty.   - Thorough search of the bladder revealed:    normal urethral meatus    normal urothelium with stellate scar on the right posterior lateral bladder wall.       Chronic inflammatory cystitis cystica type changes- stable    no stones    no ulcers     no tumors    no urethral polyps    no trabeculation  - Ureteral orifices were normal in position and appearance. Mild subtle cobblestoning of prolapse portion of bladder including trigone- unchanged  Retroflexion showed no evidence of bladder tumor and bladder neck.  Post-Procedure: - Patient tolerated the procedure well  Urinalysis  UA from PCP last month showed persistent nitrate positive and bacteria but otherwise unremarkable, consistent with previous  Assessment/ Plan:  1. Malignant neoplasm of anterior wall of urinary bladder H/o incidental bladder tumor, LgTa 1 cm s/p TURBT 12/2015 Small low-grade appearing recurrence 04/2016 NED Patient has adequate emptying. Cysto today stable Return to annual surveillance cystoscopy  2. History of kidney stones Asymptomatic  RUS 07/2017 negative  F/u cysto in 12 months    Hollice Espy, MD

## 2020-12-21 ENCOUNTER — Other Ambulatory Visit: Payer: Self-pay | Admitting: Internal Medicine

## 2020-12-21 DIAGNOSIS — Z1231 Encounter for screening mammogram for malignant neoplasm of breast: Secondary | ICD-10-CM

## 2021-01-17 ENCOUNTER — Other Ambulatory Visit (HOSPITAL_COMMUNITY): Payer: Self-pay | Admitting: Physician Assistant

## 2021-01-17 DIAGNOSIS — R9431 Abnormal electrocardiogram [ECG] [EKG]: Secondary | ICD-10-CM

## 2021-02-04 ENCOUNTER — Telehealth (HOSPITAL_COMMUNITY): Payer: Self-pay | Admitting: Emergency Medicine

## 2021-02-04 NOTE — Telephone Encounter (Signed)
Reaching out to patient to offer assistance regarding upcoming cardiac imaging study; pt verbalizes understanding of appt date/time, parking situation and where to check in, pre-test NPO status and medications ordered, and verified current allergies; name and call back number provided for further questions should they arise Marchia Bond RN Navigator Cardiac Imaging [facility] [phone] office [phone] cell  100mg  metoprolol tart 2 hr prior to scan Requested patient get BMP prior to CCTA (calling [facility] to arrange)

## 2021-02-07 ENCOUNTER — Other Ambulatory Visit: Payer: Self-pay

## 2021-02-07 ENCOUNTER — Ambulatory Visit
Admission: RE | Admit: 2021-02-07 | Discharge: 2021-02-07 | Disposition: A | Discharge status: Home / Self Care | Payer: Medicare PPO | Source: Ambulatory Visit | Attending: Physician Assistant | Admitting: Physician Assistant

## 2021-02-07 DIAGNOSIS — I2510 Atherosclerotic heart disease of native coronary artery without angina pectoris: Secondary | ICD-10-CM

## 2021-02-07 DIAGNOSIS — R9431 Abnormal electrocardiogram [ECG] [EKG]: Secondary | ICD-10-CM | POA: Insufficient documentation

## 2021-02-07 HISTORY — DX: Atherosclerotic heart disease of native coronary artery without angina pectoris: I25.10

## 2021-02-07 MED ORDER — IOHEXOL 350 MG/ML SOLN
100.0000 mL | Freq: Once | INTRAVENOUS | Status: AC | PRN
  Administered 2021-02-07: 100 mL via INTRAVENOUS

## 2021-02-07 MED ORDER — NITROGLYCERIN 0.4 MG SL SUBL
0.8000 mg | SUBLINGUAL_TABLET | Freq: Once | SUBLINGUAL | Status: AC
  Administered 2021-02-07: 0.8 mg via SUBLINGUAL

## 2021-02-08 DIAGNOSIS — R931 Abnormal findings on diagnostic imaging of heart and coronary circulation: Secondary | ICD-10-CM

## 2021-02-08 DIAGNOSIS — I2510 Atherosclerotic heart disease of native coronary artery without angina pectoris: Secondary | ICD-10-CM | POA: Diagnosis not present

## 2021-02-13 ENCOUNTER — Other Ambulatory Visit (HOSPITAL_BASED_OUTPATIENT_CLINIC_OR_DEPARTMENT_OTHER): Payer: Self-pay | Admitting: Internal Medicine

## 2021-02-13 ENCOUNTER — Other Ambulatory Visit: Payer: Self-pay | Admitting: Internal Medicine

## 2021-02-13 DIAGNOSIS — R911 Solitary pulmonary nodule: Secondary | ICD-10-CM

## 2021-02-26 ENCOUNTER — Other Ambulatory Visit: Payer: Self-pay | Admitting: Specialist

## 2021-02-26 DIAGNOSIS — R911 Solitary pulmonary nodule: Secondary | ICD-10-CM

## 2021-02-26 DIAGNOSIS — Z853 Personal history of malignant neoplasm of breast: Secondary | ICD-10-CM

## 2021-03-05 ENCOUNTER — Other Ambulatory Visit: Payer: Self-pay

## 2021-03-05 ENCOUNTER — Ambulatory Visit: Payer: Medicare PPO | Admitting: Cardiovascular Disease

## 2021-03-05 ENCOUNTER — Encounter: Payer: Self-pay | Admitting: Cardiovascular Disease

## 2021-03-05 VITALS — BP 144/70 | HR 64 | Ht 61.0 in | Wt 198.1 lb

## 2021-03-05 DIAGNOSIS — I10 Essential (primary) hypertension: Secondary | ICD-10-CM | POA: Diagnosis not present

## 2021-03-05 DIAGNOSIS — E785 Hyperlipidemia, unspecified: Secondary | ICD-10-CM

## 2021-03-05 DIAGNOSIS — I480 Paroxysmal atrial fibrillation: Secondary | ICD-10-CM | POA: Diagnosis not present

## 2021-03-05 DIAGNOSIS — I2510 Atherosclerotic heart disease of native coronary artery without angina pectoris: Secondary | ICD-10-CM

## 2021-03-05 NOTE — Patient Instructions (Signed)
Medication Instructions:  Your physician recommends that you continue on your current medications as directed. Please refer to the Current Medication list given to you today.  *If you need a refill on your cardiac medications before your next appointment, please call your pharmacy*   Lab Work: None ordered If you have labs (blood work) drawn today and your tests are completely normal, you will receive your results only by: [place] (if you have MyChart) OR A paper copy in the mail If you have any lab test that is abnormal or we need to change your treatment, we will call you to review the results.   Testing/Procedures: None ordered   Follow-Up: At [facility], you and your health needs are our priority.  As part of our continuing mission to provide you with exceptional heart care, we have created designated Provider Care Teams.  These Care Teams include your primary Cardiologist (physician) and Advanced Practice Providers (APPs -  Physician Assistants and Nurse Practitioners) who all work together to provide you with the care you need, when you need it.  We recommend signing up for the patient portal called "MyChart".  Sign up information is provided on this After Visit Summary.  MyChart is used to connect with patients for Virtual Visits (Telemedicine).  Patients are able to view lab/test results, encounter notes, upcoming appointments, etc.  Non-urgent messages can be sent to your provider as well.   To learn more about what you can do with MyChart, go to [email or web address].    Your next appointment:   Your physician wants you to follow-up in: 1 year You will receive a reminder letter in the mail two months in advance. If you don't receive a letter, please call our office to schedule the follow-up appointment.   The format for your next appointment:   In Person  Provider:   You may see Kathlyn Sacramento, MD or one of the following Advanced Practice Providers on your  designated Care Team:   Murray Hodgkins, NP Christell Faith, PA-C Cadence Kathlen Mody, [place]    Other Instructions  Heart-Healthy Eating Plan Heart-healthy meal planning includes: Eating less unhealthy fats. Eating more healthy fats. Making other changes in your diet. Talk with your doctor or a diet specialist (dietitian) to create an eating plan that is right for you.   What are tips for following this plan? Cooking Avoid frying your food. Try to bake, boil, grill, or broil it instead. You can also reduce fat by: Removing the skin from poultry. Removing all visible fats from meats. Steaming vegetables in water or broth. Meal planning  At meals, divide your plate into four equal parts: Fill one-half of your plate with vegetables and green salads. Fill one-fourth of your plate with whole grains. Fill one-fourth of your plate with lean protein foods. Eat 4-5 servings of vegetables per day. A serving of vegetables is: 1 cup of raw or cooked vegetables. 2 cups of raw leafy greens. Eat 4-5 servings of fruit per day. A serving of fruit is: 1 medium whole fruit.  cup of dried fruit.  cup of fresh, frozen, or canned fruit.  cup of 100% fruit juice. Eat more foods that have soluble fiber. These are apples, broccoli, carrots, beans, peas, and barley. Try to get 20-30 g of fiber per day. Eat 4-5 servings of nuts, legumes, and seeds per week: 1 serving of dried beans or legumes equals  cup after being cooked. 1 serving of nuts is  cup. 1 serving of seeds  equals 1 tablespoon. General information Eat more home-cooked food. Eat less restaurant, buffet, and fast food. Limit or avoid alcohol. Limit foods that are high in starch and sugar. Avoid fried foods. Lose weight if you are overweight. Keep track of how much salt (sodium) you eat. This is important if you have high blood pressure. Ask your doctor to tell you more about this. Try to add vegetarian meals each week. Fats Choose healthy  fats. These include olive oil and canola oil, flaxseeds, walnuts, almonds, and seeds. Eat more omega-3 fats. These include salmon, mackerel, sardines, tuna, flaxseed oil, and ground flaxseeds. Try to eat fish at least 2 times each week. Check food labels. Avoid foods with trans fats or high amounts of saturated fat. Limit saturated fats. These are often found in animal products, such as meats, butter, and cream. These are also found in plant foods, such as palm oil, palm kernel oil, and coconut oil. Avoid foods with partially hydrogenated oils in them. These have trans fats. Examples are stick margarine, some tub margarines, cookies, crackers, and other baked goods. What foods can I eat? Fruits All fresh, canned (in natural juice), or frozen fruits. Vegetables Fresh or frozen vegetables (raw, steamed, roasted, or grilled). Green salads. Grains Most grains. Choose whole wheat and whole grains most of the time. Rice and pasta, including brown rice and pastas made with whole wheat. Meats and other proteins Lean, well-trimmed beef, veal, pork, and lamb. Chicken and [place] without skin. All fish and shellfish. Wild duck, rabbit, pheasant, and venison. Egg whites or low-cholesterol egg substitutes. Dried beans, peas, lentils, and tofu. Seeds and most nuts. Dairy Low-fat or nonfat cheeses, including ricotta and mozzarella. Skim or 1% milk that is liquid, powdered, or evaporated. Buttermilk that is made with low-fat milk. Nonfat or low-fat yogurt. Fats and oils Non-hydrogenated (trans-free) margarines. Vegetable oils, including soybean, sesame, sunflower, olive, peanut, safflower, corn, canola, and cottonseed. Salad dressings or mayonnaise made with a vegetable oil. Beverages Mineral water. Coffee and tea. Diet carbonated beverages. Sweets and desserts Sherbet, gelatin, and fruit ice. Small amounts of dark chocolate. Limit all sweets and desserts. Seasonings and condiments All seasonings and  condiments. The items listed above may not be a complete list of foods and drinks you can eat. Contact a dietitian for more options. What foods should I avoid? Fruits Canned fruit in heavy syrup. Fruit in cream or butter sauce. Fried fruit. Limit coconut. Vegetables Vegetables cooked in cheese, cream, or butter sauce. Fried vegetables. Grains Breads that are made with saturated or trans fats, oils, or whole milk. Croissants. Sweet rolls. Donuts. High-fat crackers, such as cheese crackers. Meats and other proteins Fatty meats, such as hot dogs, ribs, sausage, bacon, rib-eye roast or steak. High-fat deli meats, such as salami and bologna. Caviar. Domestic duck and goose. Organ meats, such as liver. Dairy Cream, sour cream, cream cheese, and creamed cottage cheese. Whole-milk cheeses. Whole or 2% milk that is liquid, evaporated, or condensed. Whole buttermilk. Cream sauce or high-fat cheese sauce. Yogurt that is made from whole milk. Fats and oils Meat fat, or shortening. Cocoa butter, hydrogenated oils, palm oil, coconut oil, palm kernel oil. Solid fats and shortenings, including bacon fat, salt pork, lard, and butter. Nondairy cream substitutes. Salad dressings with cheese or sour cream. Beverages Regular sodas and juice drinks with added sugar. Sweets and desserts Frosting. Pudding. Cookies. Cakes. Pies. Milk chocolate or white chocolate. Buttered syrups. Full-fat ice cream or ice cream drinks. The items listed above may  not be a complete list of foods and drinks to avoid. Contact a dietitian for more information. Summary Heart-healthy meal planning includes eating less unhealthy fats, eating more healthy fats, and making other changes in your diet. Eat a balanced diet. This includes fruits and vegetables, low-fat or nonfat dairy, lean protein, nuts and legumes, whole grains, and heart-healthy oils and fats. This information is not intended to replace advice given to you by your health care  provider. Make sure you discuss any questions you have with your health care provider. Document Revised: 08/02/2020 Document Reviewed: 08/02/2020 Elsevier Patient Education  2022 [employer].

## 2021-03-05 NOTE — Progress Notes (Signed)
Cardiology Office Note   Date:  03/06/2021   ID:  Alyssa Duncan, Alyssa Duncan [birth year], MRN [id number]  PCP:  Adin Hector, MD  Cardiologist:   Kathlyn Sacramento, MD   Chief Complaint  Patient presents with   Other    Pt would like to discuss CT scan pt concerned about result findings. Meds reviewed verbally with pt.      History of Present Illness: Alyssa Duncan is a 80 y.o. female was referred by Dr. Caryl Comes for evaluation of coronary atherosclerosis.  She was seen by me many years ago for 1 episode of atrial fibrillation with RVR in 2017 in the setting of sepsis and bacteremia due to obstructive urethral stones.  She had an echocardiogram done which showed normal LV systolic function, mild mitral and aortic regurgitation and normal atrial size. Pulmonary pressure was normal. She had no recurrent arrhythmia off amiodarone. She was taken off anticoagulation given that atrial fibrillation was in the setting of acute illness.  She had no recurrent atrial fibrillation since then. Other medical problems include essential hypertension, hyperlipidemia, hypothyroidism and chronic venous insufficiency. She recently had left arm discomfort that happened over 2 days described as aching sensation and was not exertional.  She had no chest pain or shortness of breath.  She was referred for cardiac CTA which showed evidence of aortic atherosclerosis as well as coronary calcifications with calcium score of 452 with moderate proximal LAD stenosis and mild ostial RCA stenosis.  CT FFR was negative.  She reports no recurrent symptoms overall.  No exertional chest pain or shortness of breath and no exertional leg pain.  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   Choledocholithiasis    Difficult intubation    Dysrhythmia 12/2015   A-fib   Family  history of adverse reaction to anesthesia    dad had hard time waking up from anesthesia   Family history of bladder cancer    Family history of breast cancer    Family history of prostate cancer    Family history of skin cancer    Gallstone pancreatitis 05/13/2017   GERD (gastroesophageal reflux disease)    History of kidney stones    Hyperlipidemia    Hypertension    Hypothyroidism    Kidney stones 12/2015   Personal history of radiation therapy 2013   BREAST CA   Shingles    Skin cancer 2005   left arm and nose   Sleep apnea     Past Surgical History:  Procedure Laterality Date   BREAST BIOPSY Left 2013   POS   BREAST BIOPSY Left 2018   benign fat necrosis   BREAST LUMPECTOMY Left 2013   with rad tx   CARPAL TUNNEL RELEASE     CATARACT EXTRACTION     CHOLECYSTECTOMY N/A 05/15/2017   Procedure: LAPAROSCOPIC CHOLECYSTECTOMY WITH INTRAOPERATIVE CHOLANGIOGRAM;  Surgeon: Vickie Epley, MD;  Location: [facility];  Service: General;  Laterality: N/A;   COLONOSCOPY WITH PROPOFOL N/A 05/10/2015   Procedure: COLONOSCOPY WITH PROPOFOL;  Surgeon: Hulen Luster, MD;  Location: [facility];  Service: Gastroenterology;  Laterality: N/A;   CYSTOSCOPY W/ RETROGRADES Bilateral 01/02/2016   Procedure: CYSTOSCOPY WITH RETROGRADE PYELOGRAM;  Surgeon: Hollice Espy, MD;  Location: [facility];  Service: Urology;  Laterality: Bilateral;   CYSTOSCOPY W/ RETROGRADES Bilateral 11/04/2017   Procedure: CYSTOSCOPY WITH RETROGRADE PYELOGRAM;  Surgeon: Hollice Espy, MD;  Location: [facility];  Service: Urology;  Laterality: Bilateral;   CYSTOSCOPY W/ URETERAL STENT PLACEMENT Right 01/02/2016   Procedure: CYSTOSCOPY WITH STENT REPLACEMENT;  Surgeon: Hollice Espy, MD;  Location: [facility];  Service: Urology;  Laterality: Right;   CYSTOSCOPY WITH STENT PLACEMENT Right 12/04/2015   Procedure: CYSTOSCOPY WITH STENT PLACEMENT;  Surgeon: Hollice Espy, MD;  Location: [facility];  Service: Urology;  Laterality: Right;    ENDOSCOPIC RETROGRADE CHOLANGIOPANCREATOGRAPHY (ERCP) WITH PROPOFOL N/A 05/14/2017   Procedure: ENDOSCOPIC RETROGRADE CHOLANGIOPANCREATOGRAPHY (ERCP) WITH PROPOFOL;  Surgeon: Lucilla Lame, MD;  Location: [facility];  Service: Endoscopy;  Laterality: N/A;   JOINT REPLACEMENT Left 2013   hip   KNEE ARTHROSCOPY     SEPTOPLASTY     TRANSURETHRAL RESECTION OF BLADDER TUMOR WITH MITOMYCIN-C N/A 01/02/2016   Procedure: TRANSURETHRAL RESECTION OF BLADDER TUMOR WITH MITOMYCIN-C;  Surgeon: Hollice Espy, MD;  Location: [facility];  Service: Urology;  Laterality: N/A;   URETEROSCOPY WITH HOLMIUM LASER LITHOTRIPSY Right 01/02/2016   Procedure: URETEROSCOPY WITH HOLMIUM LASER LITHOTRIPSY;  Surgeon: Hollice Espy, MD;  Location: [facility];  Service: Urology;  Laterality: Right;   UVULOPALATOPHARYNGOPLASTY     and tongue surgery     Current Outpatient Medications  Medication Sig Dispense Refill   acetaminophen (TYLENOL) 500 MG tablet Take 1,000 mg by mouth 2 (two) times daily.     aspirin EC 81 MG tablet Take 1 tablet (81 mg total) by mouth daily. 90 tablet 3   Calcium Carb-Cholecalciferol 600-800 MG-UNIT TABS Take 1 tablet by mouth 2 (two) times daily.     docusate sodium (COLACE) 50 MG capsule Take 50 mg by mouth daily.     dorzolamide-timolol (COSOPT) 22.3-6.8 MG/ML ophthalmic solution Place 1 drop into both eyes 2 (two) times daily.      furosemide (LASIX) 20 MG tablet Take 20 mg by mouth daily.     hydrochlorothiazide (HYDRODIURIL) 25 MG tablet Take 25 mg by mouth daily.     ibuprofen (ADVIL,MOTRIN) 200 MG tablet Take 400-600 mg by mouth every 6 (six) hours as needed for headache or moderate pain.      levothyroxine (SYNTHROID, LEVOTHROID) 75 MCG tablet Take 75 mcg by mouth daily before breakfast.     loratadine (CLARITIN) 10 MG tablet Take 10 mg by mouth daily.     losartan (COZAAR) 100 MG tablet Take 100 mg by mouth every morning.      lovastatin (MEVACOR) 40 MG tablet Take 40 mg by mouth at  bedtime.     LUMIGAN 0.01 % SOLN Place 1 drop into both eyes at bedtime.      metoprolol tartrate (LOPRESSOR) 100 MG tablet Take 100 mg by mouth 2 (two) times daily.     pantoprazole (PROTONIX) 40 MG tablet Take 40 mg by mouth at bedtime.      potassium chloride SA (K-DUR,KLOR-CON) 20 MEQ tablet Take 20 mEq by mouth 2 (two) times daily.     Prenatal Vit-Fe Fumarate-FA (PRENATAL MULTIVITAMIN) TABS tablet Take 1 tablet by mouth daily at 12 noon.     vitamin B-12 (CYANOCOBALAMIN) 1000 MCG tablet Take 1,000 mcg by mouth daily.     vitamin C (ASCORBIC ACID) 500 MG tablet Take 500 mg by mouth daily.     Current Facility-Administered Medications  Medication Dose Route Frequency Provider Last Rate Last Admin   lidocaine (XYLOCAINE)  2 % jelly 1 application  1 application Urethral Once Hollice Espy, MD        Allergies:   Ativan [lorazepam], Cardura [doxazosin], Clonidine derivatives, Enalapril, Levaquin [levofloxacin], Mobic [meloxicam], Sulfa antibiotics, and Vicodin [hydrocodone-acetaminophen]    Social History:  The patient  reports that she has never smoked. She has never used smokeless tobacco. She reports that she does not drink alcohol and does not use drugs.   Family History:  The patient's family history includes Bladder Cancer in her father; Breast cancer (age of onset: 57) in her daughter; Breast cancer (age of onset: 6) in her mother; Esophageal cancer (age of onset: 92) in her sister; Kidney Stones in her brother; Melanoma in her daughter; Prostate cancer in her brother; Skin cancer in her brother, daughter, daughter, and sister.    ROS:  Please see the history of present illness.   Otherwise, review of systems are positive for none.   All other systems are reviewed and negative.    PHYSICAL EXAM: VS:  BP (!) 144/70 (BP Location: Right Arm, Patient Position: Sitting, Cuff Size: Large)   Pulse 64   Ht 5\' 1"  (1.549 m)   Wt 198 lb 2 oz (89.9 kg)   SpO2 96%   BMI 37.44 kg/m  , BMI  Body mass index is 37.44 kg/m. GEN: Well nourished, well developed, in no acute distress  HEENT: normal  Neck: no JVD, carotid bruits, or masses Cardiac: RRR; no murmurs, rubs, or gallops,no edema  Respiratory:  clear to auscultation bilaterally, normal work of breathing GI: soft, nontender, nondistended, + BS MS: no deformity or atrophy  Skin: warm and dry, no rash Neuro:  Strength and sensation are intact Psych: euthymic mood, full affect   EKG:  EKG is ordered today. The ekg ordered today demonstrates normal sinus rhythm with poor R wave progression in the anterior leads.  Recent Labs: No results found for requested labs within last 8760 hours.    Lipid Panel No results found for: CHOL, TRIG, HDL, CHOLHDL, VLDL, LDLCALC, LDLDIRECT    Wt Readings from Last 3 Encounters:  03/05/21 198 lb 2 oz (89.9 kg)  11/13/20 200 lb (90.7 kg)  05/16/20 200 lb (90.7 kg)       No flowsheet data found.    ASSESSMENT AND PLAN:  1.  Coronary artery disease involving native coronary arteries without angina: The patient's cardiac CTA showed evidence of mild to moderate nonobstructive coronary artery disease.  Currently she has no symptoms of angina.  I suspect that her left arm discomfort was likely musculoskeletal and not cardiac in nature.  Given lack of symptoms at the present time and nonobstructive disease, no indication for cardiac catheterization.  I discussed with her the importance of healthy lifestyle changes and regular exercise.  Continue low-dose aspirin. I provided her with heart healthy diet instructions.  2. Paroxysmal atrial fibrillation: The patient had one episode of atrial fibrillation in the setting of sepsis and respiratory distress  In 2017.  No evidence of recurrent arrhythmia since then.  No need for long-term anticoagulation unless she develops recurrent atrial fibrillation.  3.  Essential hypertension: Blood pressure is reasonably controlled on current  medication.  4.  Hyperlipidemia: I reviewed most recent lipid profile done in July of this year which showed an LDL of 54.  Continue lovastatin as her LDL is at target.  Triglyceride was mildly elevated at 206.  Hopefully this will improve with healthy diet.    Disposition:   FU  with me in 12 months.  Signed,  Kathlyn Sacramento, MD  03/06/2021 2:55 PM    [place]

## 2021-03-21 ENCOUNTER — Ambulatory Visit
Admission: RE | Admit: 2021-03-21 | Discharge: 2021-03-21 | Disposition: A | Discharge status: Home / Self Care | Payer: Medicare PPO | Source: Ambulatory Visit | Attending: Internal Medicine | Admitting: Internal Medicine

## 2021-03-21 ENCOUNTER — Other Ambulatory Visit: Payer: Self-pay

## 2021-03-21 DIAGNOSIS — Z1231 Encounter for screening mammogram for malignant neoplasm of breast: Secondary | ICD-10-CM | POA: Insufficient documentation

## 2021-03-25 ENCOUNTER — Ambulatory Visit: Payer: Medicare PPO | Admitting: Oncology

## 2021-04-09 ENCOUNTER — Encounter: Payer: Self-pay | Admitting: Oncology

## 2021-04-09 ENCOUNTER — Inpatient Hospital Stay: Payer: Medicare PPO | Attending: Oncology | Admitting: Oncology

## 2021-04-09 ENCOUNTER — Other Ambulatory Visit: Payer: Self-pay

## 2021-04-09 VITALS — BP 147/72 | HR 68 | Temp 98.1°F | Wt 197.0 lb

## 2021-04-09 DIAGNOSIS — Z7982 Long term (current) use of aspirin: Secondary | ICD-10-CM | POA: Diagnosis not present

## 2021-04-09 DIAGNOSIS — Z8551 Personal history of malignant neoplasm of bladder: Secondary | ICD-10-CM

## 2021-04-09 DIAGNOSIS — D0512 Intraductal carcinoma in situ of left breast: Secondary | ICD-10-CM

## 2021-04-09 DIAGNOSIS — Z86000 Personal history of in-situ neoplasm of breast: Secondary | ICD-10-CM | POA: Diagnosis not present

## 2021-04-09 DIAGNOSIS — I10 Essential (primary) hypertension: Secondary | ICD-10-CM | POA: Insufficient documentation

## 2021-04-09 DIAGNOSIS — I4891 Unspecified atrial fibrillation: Secondary | ICD-10-CM | POA: Insufficient documentation

## 2021-04-09 DIAGNOSIS — Z79899 Other long term (current) drug therapy: Secondary | ICD-10-CM | POA: Diagnosis not present

## 2021-04-09 DIAGNOSIS — Z923 Personal history of irradiation: Secondary | ICD-10-CM | POA: Insufficient documentation

## 2021-04-09 DIAGNOSIS — E039 Hypothyroidism, unspecified: Secondary | ICD-10-CM | POA: Insufficient documentation

## 2021-04-09 DIAGNOSIS — R911 Solitary pulmonary nodule: Secondary | ICD-10-CM | POA: Diagnosis not present

## 2021-04-09 NOTE — Progress Notes (Signed)
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 Mike Gip.  She switched care to me on 12/17/2018. 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  Pathology revealed a 3.6 cm area of grade II ductal carcinoma in situ. Margins were clear but close at 1 mm.  DCIS was ER + (> 90%), PR + (> 90%).  Pathologic stage was TisNx.  Status post adjuvant radiation which was completed in 06/2012.    Antiestrogen treatments : she was initially treated with Femara, but discontinued secondary to joint pains.  She tried tamoxifen, but discontinued secondary to swelling.  She began Aromasin since 11/16/2012.  Stopped Aromasin in September 2019 after finished total of 5 years of antiestrogen treatments.  Bilateral diagnostic mammogram on 03/24/2016 revealed no evidence of malignancy.  Diagnostic left mammogram and ultrasound on 06/09/2016 revealed an irregular hypoechogenicity in the left breast at 6 o'clock 2 cm from the nipple measuring 9 x 7 x 10 mm. This was felt likely to represent post treatment change but underlying malignancy could not be excluded.  Ultrasound of the left axilla revealed no enlarged adenopathy.   Ultrasound guided biopsy on 06/19/2016 revealed changes c/w organizing fat necrosis.  Bilateral diagnostic mammogram on 03/13/2017 that revealed a suspicious 6 mm mass in the superficial upper outer left breast.  Biopsy on 03/24/2017 results revealed fat necrosis with dystrophic calcification.   Bone density on 08/22/2015 was normal with a T-score of -1.0 in the left femur.  Bone density on 08/25/2017 was normal with a T-score of -0.8 in the femoral neck.  #, 2017, history of non-muscle invasive bladder cancer.  She  underwent TURBT on 01/02/2016.  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  Cystoscopy on 05/01/2016 revealed a very small low-grade appearing recurrence.  She underwent cystoscopy and fulguration on 05/06/2016.  Cystoscopy on 04/29/2017 revealed no evidence of disease.  Next cystoscopy planned for 07/28/2017.  # She has stopped Arimidex per Dr. Kem Parkinson recommendation after being seen in September 2020  [name] is a 81 y.o. female who has above history reviewed by me today presents for follow up visit for management of history of DCIS Problems and complaints are listed below: She finished 5 years of endocrine therapy.  Patient reports feeling well.  She denies any new breast concerns.  She has chronic intermittent soreness of the left breast lumpectomy sites.  No new complaints.   Past Medical History:  Diagnosis Date   Acute biliary pancreatitis    Arthritis    Asthma    Back pain    Bladder cancer ([place])    Breast cancer ([place]) 2013   left breast ductal and lobular carcinoma   Choledocholithiasis    Difficult intubation    Dysrhythmia 12/2015   A-fib   Family history of adverse reaction to anesthesia    dad had hard time waking up from anesthesia   Family history of bladder cancer    Family history of breast cancer    Family history of prostate cancer    Family history of skin cancer    Gallstone pancreatitis 05/13/2017   GERD (gastroesophageal reflux disease)    History of kidney stones  Hyperlipidemia    Hypertension    Hypothyroidism    Kidney stones 12/2015   Personal history of radiation therapy 2013   BREAST CA   Shingles    Skin cancer 2005   left arm and nose   Sleep apnea     Past Surgical History:  Procedure Laterality Date   BREAST BIOPSY Left 2013   POS   BREAST BIOPSY Left 2018   benign fat necrosis   BREAST LUMPECTOMY Left 2013   with rad tx   CARPAL TUNNEL RELEASE     CATARACT EXTRACTION      CHOLECYSTECTOMY N/A 05/15/2017   Procedure: LAPAROSCOPIC CHOLECYSTECTOMY WITH INTRAOPERATIVE CHOLANGIOGRAM;  Surgeon: Vickie Epley, MD;  Location: [facility];  Service: General;  Laterality: N/A;   COLONOSCOPY WITH PROPOFOL N/A 05/10/2015   Procedure: COLONOSCOPY WITH PROPOFOL;  Surgeon: Hulen Luster, MD;  Location: [facility];  Service: Gastroenterology;  Laterality: N/A;   CYSTOSCOPY W/ RETROGRADES Bilateral 01/02/2016   Procedure: CYSTOSCOPY WITH RETROGRADE PYELOGRAM;  Surgeon: Hollice Espy, MD;  Location: [facility];  Service: Urology;  Laterality: Bilateral;   CYSTOSCOPY W/ RETROGRADES Bilateral 11/04/2017   Procedure: CYSTOSCOPY WITH RETROGRADE PYELOGRAM;  Surgeon: Hollice Espy, MD;  Location: [facility];  Service: Urology;  Laterality: Bilateral;   CYSTOSCOPY W/ URETERAL STENT PLACEMENT Right 01/02/2016   Procedure: CYSTOSCOPY WITH STENT REPLACEMENT;  Surgeon: Hollice Espy, MD;  Location: [facility];  Service: Urology;  Laterality: Right;   CYSTOSCOPY WITH STENT PLACEMENT Right 12/04/2015   Procedure: CYSTOSCOPY WITH STENT PLACEMENT;  Surgeon: Hollice Espy, MD;  Location: [facility];  Service: Urology;  Laterality: Right;   ENDOSCOPIC RETROGRADE CHOLANGIOPANCREATOGRAPHY (ERCP) WITH PROPOFOL N/A 05/14/2017   Procedure: ENDOSCOPIC RETROGRADE CHOLANGIOPANCREATOGRAPHY (ERCP) WITH PROPOFOL;  Surgeon: Lucilla Lame, MD;  Location: [facility];  Service: Endoscopy;  Laterality: N/A;   JOINT REPLACEMENT Left 2013   hip   KNEE ARTHROSCOPY     SEPTOPLASTY     TRANSURETHRAL RESECTION OF BLADDER TUMOR WITH MITOMYCIN-C N/A 01/02/2016   Procedure: TRANSURETHRAL RESECTION OF BLADDER TUMOR WITH MITOMYCIN-C;  Surgeon: Hollice Espy, MD;  Location: [facility];  Service: Urology;  Laterality: N/A;   URETEROSCOPY WITH HOLMIUM LASER LITHOTRIPSY Right 01/02/2016   Procedure: URETEROSCOPY WITH HOLMIUM LASER LITHOTRIPSY;  Surgeon: Hollice Espy, MD;  Location: [facility];  Service: Urology;  Laterality: Right;    UVULOPALATOPHARYNGOPLASTY     and tongue surgery    Family History  Problem Relation Age of Onset   Breast cancer Mother 45   Bladder Cancer Father    Kidney Stones Brother    Skin cancer Brother        worked on farm   Prostate cancer Brother        dx late 24s   Esophageal cancer Sister 41   Skin cancer Sister    Skin cancer Daughter    Breast cancer Daughter 38   Skin cancer Daughter    Melanoma Daughter     Social History:  reports that she has never smoked. She has never used smokeless tobacco. She reports that she does not drink alcohol and does not use drugs.  The patient is alone today.  Allergies:  Allergies  Allergen Reactions   Ativan [Lorazepam] Other (See Comments)    Hysteria    Cardura [Doxazosin] Other (See Comments)    unsure   Clonidine Derivatives Other (See Comments)    unsure   Enalapril Other (See Comments)    unsure   Levaquin [Levofloxacin] Other (See  Comments)    Couldn't raise her arms   Mobic [Meloxicam] Other (See Comments)    unsure   Sulfa Antibiotics Nausea Only   Vicodin [Hydrocodone-Acetaminophen] Other (See Comments)    unsure    Current Medications: Current Outpatient Medications  Medication Sig Dispense Refill   acetaminophen (TYLENOL) 500 MG tablet Take 1,000 mg by mouth 2 (two) times daily.     aspirin EC 81 MG tablet Take 1 tablet (81 mg total) by mouth daily. 90 tablet 3   Calcium Carb-Cholecalciferol 600-800 MG-UNIT TABS Take 1 tablet by mouth 2 (two) times daily.     docusate sodium (COLACE) 50 MG capsule Take 50 mg by mouth daily.     dorzolamide-timolol (COSOPT) 22.3-6.8 MG/ML ophthalmic solution Place 1 drop into both eyes 2 (two) times daily.      furosemide (LASIX) 20 MG tablet Take 20 mg by mouth daily.     hydrochlorothiazide (HYDRODIURIL) 25 MG tablet Take 25 mg by mouth daily.     ibuprofen (ADVIL,MOTRIN) 200 MG tablet Take 400-600 mg by mouth every 6 (six) hours as needed for headache or moderate pain.       levothyroxine (SYNTHROID, LEVOTHROID) 75 MCG tablet Take 75 mcg by mouth daily before breakfast.     loratadine (CLARITIN) 10 MG tablet Take 10 mg by mouth daily.     losartan (COZAAR) 100 MG tablet Take 100 mg by mouth every morning.      lovastatin (MEVACOR) 40 MG tablet Take 40 mg by mouth at bedtime.     LUMIGAN 0.01 % SOLN Place 1 drop into both eyes at bedtime.      metoprolol tartrate (LOPRESSOR) 100 MG tablet Take 100 mg by mouth 2 (two) times daily.     pantoprazole (PROTONIX) 40 MG tablet Take 40 mg by mouth at bedtime.      potassium chloride SA (K-DUR,KLOR-CON) 20 MEQ tablet Take 20 mEq by mouth 2 (two) times daily.     Prenatal Vit-Fe Fumarate-FA (PRENATAL MULTIVITAMIN) TABS tablet Take 1 tablet by mouth daily at 12 noon.     vitamin B-12 (CYANOCOBALAMIN) 1000 MCG tablet Take 1,000 mcg by mouth daily.     vitamin C (ASCORBIC ACID) 500 MG tablet Take 500 mg by mouth daily.     Current Facility-Administered Medications  Medication Dose Route Frequency Provider Last Rate Last Admin   lidocaine (XYLOCAINE) 2 % jelly 1 application  1 application Urethral Once Hollice Espy, MD       Review of Systems  Constitutional:  Negative for appetite change, chills, fatigue and fever.  HENT:   Negative for hearing loss and voice change.   Eyes:  Negative for eye problems.  Respiratory:  Negative for chest tightness and cough.   Cardiovascular:  Negative for chest pain.  Gastrointestinal:  Negative for abdominal distention, abdominal pain and blood in stool.  Endocrine: Negative for hot flashes.  Genitourinary:  Negative for difficulty urinating and frequency.   Skin:  Negative for itching and rash.  Neurological:  Negative for extremity weakness.  Hematological:  Negative for adenopathy.  Psychiatric/Behavioral:  Negative for confusion.     Physical Exam: Blood pressure (!) 147/72, pulse 68, temperature 98.1 F (36.7 C), temperature source Tympanic, weight 197 lb (89.4 kg).  Physical  Exam Constitutional:      General: She is not in acute distress.    Appearance: She is not diaphoretic.     Comments: Patient walks independently  HENT:     Head: Normocephalic  and atraumatic.     Nose: Nose normal.     Mouth/Throat:     Pharynx: No oropharyngeal exudate.  Eyes:     General: No scleral icterus.    Pupils: Pupils are equal, round, and reactive to light.  Cardiovascular:     Rate and Rhythm: Normal rate and regular rhythm.     Heart sounds: No murmur heard. Pulmonary:     Effort: Pulmonary effort is normal. No respiratory distress.     Breath sounds: No rales.  Chest:     Chest wall: No tenderness.  Abdominal:     General: There is no distension.     Palpations: Abdomen is soft.     Tenderness: There is no abdominal tenderness.  Musculoskeletal:        General: Normal range of motion.     Cervical back: Normal range of motion and neck supple.  Skin:    General: Skin is warm and dry.     Findings: No erythema.  Neurological:     Mental Status: She is alert and oriented to person, place, and time.     Cranial Nerves: No cranial nerve deficit.     Motor: No abnormal muscle tone.     Coordination: Coordination normal.  Psychiatric:        Mood and Affect: Affect normal.   Breast exam was performed in seated and lying down position. Patient is status post left lumpectomy with a well-healed surgical scar.  Left breast post-operative changes between 6 o'clock and 6:30 position.  Left nipple indentation with scar.   No palpable masses or axillary adenopathy bilaterally.    No visits with results within 3 Day(s) from this visit.  Latest known visit with results is:  Procedure visit on 05/16/2020  Component Date Value Ref Range Status   Specific Gravity, UA 05/16/2020 1.020  1.005 - 1.030 Final   pH, UA 05/16/2020 7.5  5.0 - 7.5 Final   Color, UA 05/16/2020 Yellow  Yellow Final   Appearance Ur 05/16/2020 Cloudy (A)  Clear Final   Leukocytes,UA 05/16/2020  Negative  Negative Final   Protein,UA 05/16/2020 Negative  Negative/Trace Final   Glucose, UA 05/16/2020 Negative  Negative Final   Ketones, UA 05/16/2020 Negative  Negative Final   RBC, UA 05/16/2020 Trace (A)  Negative Final   Bilirubin, UA 05/16/2020 Negative  Negative Final   Urobilinogen, Ur 05/16/2020 0.2  0.2 - 1.0 mg/dL Final   Nitrite, UA 05/16/2020 Positive (A)  Negative Final   Microscopic Examination 05/16/2020 See below:   Final   WBC, UA 05/16/2020 0-5  0 - 5 /hpf Final   RBC 05/16/2020 0-2  0 - 2 /hpf Final   Epithelial Cells (non renal) 05/16/2020 0-10  0 - 10 /hpf Final   Bacteria, UA 05/16/2020 Many (A)  None seen/Few Final    Assessment:  Alyssa Duncan is a 81 y.o. female presents for DCIS follow-up.  1. Ductal carcinoma in situ (DCIS) of left breast   2. History of bladder cancer   3. Lung nodule    #Left breast DCIS, patient is doing very well clinically. She finished 5 years of antiestrogen treatments. 03/22/2021, bilateral screening mammogram showed no mammographic evidence of malignancy.  Continue annual mammogram surveillance. We discussed the option of being discharged from oncology Duncan. Patient prefers to continue follow-up annually with breast physical examination, will continue follow-up with her.  History of noninvasive bladder cancer, continue follow-up with urology.   Patient follow-up with  urology Dr. Gerarda Gunther appointment 11/13/2020.  Small incidental finding on recent CT scan.  Patient has establish care with Dr. Raul Del and has a repeat CT scan ordered and is scheduled.  RTC in 1 year for MD assessment and labs (CBC with diff, CMP).  We spent sufficient time to discuss many aspect of care, questions were answered to patient's satisfaction.    Earlie Server, MD  04/09/21

## 2021-05-29 ENCOUNTER — Ambulatory Visit: Admission: RE | Admit: 2021-05-29 | Payer: Medicare PPO | Source: Ambulatory Visit

## 2021-06-07 ENCOUNTER — Ambulatory Visit
Admission: RE | Admit: 2021-06-07 | Discharge: 2021-06-07 | Disposition: A | Discharge status: Home / Self Care | Payer: Medicare PPO | Source: Ambulatory Visit | Attending: Specialist | Admitting: Specialist

## 2021-06-07 ENCOUNTER — Other Ambulatory Visit: Payer: Self-pay

## 2021-06-07 DIAGNOSIS — R911 Solitary pulmonary nodule: Secondary | ICD-10-CM | POA: Insufficient documentation

## 2021-06-07 LAB — POCT I-STAT CREATININE: Creatinine, Ser: 1.1 mg/dL — ABNORMAL HIGH (ref 0.44–1.00)

## 2021-06-07 MED ORDER — IOHEXOL 300 MG/ML  SOLN
75.0000 mL | Freq: Once | INTRAMUSCULAR | Status: AC | PRN
  Administered 2021-06-07: 75 mL via INTRAVENOUS

## 2021-06-30 ENCOUNTER — Other Ambulatory Visit (HOSPITAL_COMMUNITY): Payer: Self-pay | Admitting: Specialist

## 2021-06-30 ENCOUNTER — Other Ambulatory Visit: Payer: Self-pay | Admitting: Specialist

## 2021-06-30 DIAGNOSIS — R911 Solitary pulmonary nodule: Secondary | ICD-10-CM

## 2021-10-21 ENCOUNTER — Ambulatory Visit
Admission: RE | Admit: 2021-10-21 | Discharge: 2021-10-21 | Disposition: A | Discharge status: Home / Self Care | Payer: Medicare PPO | Source: Ambulatory Visit | Attending: Specialist | Admitting: Specialist

## 2021-10-21 DIAGNOSIS — R911 Solitary pulmonary nodule: Secondary | ICD-10-CM | POA: Diagnosis present

## 2021-11-13 ENCOUNTER — Encounter: Payer: Self-pay | Admitting: Urology

## 2021-11-13 ENCOUNTER — Ambulatory Visit: Payer: Medicare PPO | Admitting: Urology

## 2021-11-13 VITALS — BP 150/74 | HR 65 | Ht 61.0 in | Wt 197.0 lb

## 2021-11-13 DIAGNOSIS — Z8551 Personal history of malignant neoplasm of bladder: Secondary | ICD-10-CM | POA: Diagnosis not present

## 2021-11-13 DIAGNOSIS — N3000 Acute cystitis without hematuria: Secondary | ICD-10-CM | POA: Diagnosis not present

## 2021-11-13 DIAGNOSIS — C679 Malignant neoplasm of bladder, unspecified: Secondary | ICD-10-CM

## 2021-11-13 LAB — MICROSCOPIC EXAMINATION

## 2021-11-13 LAB — URINALYSIS, COMPLETE
Bilirubin, UA: NEGATIVE
Glucose, UA: NEGATIVE
Ketones, UA: NEGATIVE
Nitrite, UA: POSITIVE — AB
RBC, UA: NEGATIVE
Specific Gravity, UA: 1.015 (ref 1.005–1.030)
Urobilinogen, Ur: 0.2 mg/dL (ref 0.2–1.0)
pH, UA: 6.5 (ref 5.0–7.5)

## 2021-11-13 MED ORDER — CEPHALEXIN 500 MG PO CAPS: 500.0000 mg | ORAL_CAPSULE | Freq: Three times a day (TID) | ORAL | 0 refills | Status: DC

## 2021-11-13 NOTE — Progress Notes (Signed)
11/13/21  CC:  Chief Complaint  Patient presents with   Cysto    HPI: Alyssa Duncan is a 81 y.o.female with a personal history of malignant neoplasm of interior wall of urinary bladder and history of kidney stones, who presents today for an annual surveillance cystoscopy.  Patient was admitted on 12/03/2015 with fevers, right flank pain found to have an obstructing right distal 5 mm ureteral stone. She was taken to the operating room for cystoscopy, right ureteral stent placement in the setting of worsening leukocytosis and uncontrolled pain.  Intraoperatively, an incidental bladder tumor was identified.     She returned to the operating room on 01/02/2016 for definitive management of her stone via ureteroscopy, laser lithotripsy followed by TURBT. Surgical pathology was consistent with low-grade noninvasive bladder cancer. 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   Cysto in office on 05/01/2016 showed a very small low-grade appearing recurrence ~2 mm, anterior bladder wall in close proximity to the bladder neck. She underwent cystoscopy, fulguration of this lesion in the office shortly thereafter which was well tolerated.     Patient  had concern for possible early recurrence on 10/2017 with some subtle carpeting around the stellate scar as well as focal area of erythema.  She was taken to the operating room shortly thereafter at which time no pathology was identified.  Bilateral retrogrades were unremarkable.     11-30 WBCs, Nitrite positive ,moderate bacteria   She reports burning with urination.    PMH: Past Medical History:  Diagnosis Date   Acute biliary pancreatitis    Arthritis    Asthma    Back pain    Bladder cancer ([place])    Breast cancer ([place]) 2013   left breast ductal and lobular carcinoma   Choledocholithiasis    Difficult intubation    Dysrhythmia 12/2015   A-fib   Family history of adverse reaction to anesthesia    dad had hard time  waking up from anesthesia   Family history of bladder cancer    Family history of breast cancer    Family history of prostate cancer    Family history of skin cancer    Gallstone pancreatitis 05/13/2017   GERD (gastroesophageal reflux disease)    History of kidney stones    Hyperlipidemia    Hypertension    Hypothyroidism    Kidney stones 12/2015   Personal history of radiation therapy 2013   BREAST CA   Shingles    Skin cancer 2005   left arm and nose   Sleep apnea     Surgical History: Past Surgical History:  Procedure Laterality Date   BREAST BIOPSY Left 2013   POS   BREAST BIOPSY Left 2018   benign fat necrosis   BREAST LUMPECTOMY Left 2013   with rad tx   CARPAL TUNNEL RELEASE     CATARACT EXTRACTION     CHOLECYSTECTOMY N/A 05/15/2017   Procedure: LAPAROSCOPIC CHOLECYSTECTOMY WITH INTRAOPERATIVE CHOLANGIOGRAM;  Surgeon: Vickie Epley, MD;  Location: [facility];  Service: General;  Laterality: N/A;   COLONOSCOPY WITH PROPOFOL N/A 05/10/2015   Procedure: COLONOSCOPY WITH PROPOFOL;  Surgeon: Hulen Luster, MD;  Location: [facility];  Service: Gastroenterology;  Laterality: N/A;   CYSTOSCOPY W/ RETROGRADES Bilateral 01/02/2016   Procedure: CYSTOSCOPY WITH RETROGRADE PYELOGRAM;  Surgeon: Hollice Espy, MD;  Location: [facility];  Service: Urology;  Laterality: Bilateral;   CYSTOSCOPY W/ RETROGRADES Bilateral 11/04/2017   Procedure: CYSTOSCOPY WITH RETROGRADE PYELOGRAM;  Surgeon: Hollice Espy, MD;  Location: [facility];  Service: Urology;  Laterality: Bilateral;   CYSTOSCOPY W/ URETERAL STENT PLACEMENT Right 01/02/2016   Procedure: CYSTOSCOPY WITH STENT REPLACEMENT;  Surgeon: Hollice Espy, MD;  Location: [facility];  Service: Urology;  Laterality: Right;   CYSTOSCOPY WITH STENT PLACEMENT Right 12/04/2015   Procedure: CYSTOSCOPY WITH STENT PLACEMENT;  Surgeon: Hollice Espy, MD;  Location: [facility];  Service: Urology;  Laterality: Right;   ENDOSCOPIC RETROGRADE  CHOLANGIOPANCREATOGRAPHY (ERCP) WITH PROPOFOL N/A 05/14/2017   Procedure: ENDOSCOPIC RETROGRADE CHOLANGIOPANCREATOGRAPHY (ERCP) WITH PROPOFOL;  Surgeon: Lucilla Lame, MD;  Location: [facility];  Service: Endoscopy;  Laterality: N/A;   JOINT REPLACEMENT Left 2013   hip   KNEE ARTHROSCOPY     SEPTOPLASTY     TRANSURETHRAL RESECTION OF BLADDER TUMOR WITH MITOMYCIN-C N/A 01/02/2016   Procedure: TRANSURETHRAL RESECTION OF BLADDER TUMOR WITH MITOMYCIN-C;  Surgeon: Hollice Espy, MD;  Location: [facility];  Service: Urology;  Laterality: N/A;   URETEROSCOPY WITH HOLMIUM LASER LITHOTRIPSY Right 01/02/2016   Procedure: URETEROSCOPY WITH HOLMIUM LASER LITHOTRIPSY;  Surgeon: Hollice Espy, MD;  Location: [facility];  Service: Urology;  Laterality: Right;   UVULOPALATOPHARYNGOPLASTY     and tongue surgery    Home Medications:  Allergies as of 11/13/2021       Reactions   Ativan [lorazepam] Other (See Comments)   Hysteria    Cardura [doxazosin] Other (See Comments)   unsure   Clonidine Derivatives Other (See Comments)   unsure   Enalapril Other (See Comments)   unsure   Levaquin [levofloxacin] Other (See Comments)   Couldn't raise her arms   Mobic [meloxicam] Other (See Comments)   unsure   Sulfa Antibiotics Nausea Only   Vicodin [hydrocodone-acetaminophen] Other (See Comments)   unsure        Medication List        Accurate as of November 13, 2021 11:59 PM. If you have any questions, ask your nurse or doctor.          acetaminophen 500 MG tablet Commonly known as: TYLENOL Take 1,000 mg by mouth 2 (two) times daily.   ascorbic acid 500 MG tablet Commonly known as: VITAMIN C Take 500 mg by mouth daily.   aspirin EC 81 MG tablet Take 1 tablet (81 mg total) by mouth daily.   Calcium Carb-Cholecalciferol 600-800 MG-UNIT Tabs Take 1 tablet by mouth 2 (two) times daily.   cephALEXin 500 MG capsule Commonly known as: Keflex Take 1 capsule (500 mg total) by mouth 3 (three) times  daily. Started by: Hollice Espy, MD   cyanocobalamin 1000 MCG tablet Commonly known as: VITAMIN B12 Take 1,000 mcg by mouth daily.   docusate sodium 50 MG capsule Commonly known as: COLACE Take 50 mg by mouth daily.   dorzolamide-timolol 22.3-6.8 MG/ML ophthalmic solution Commonly known as: COSOPT Place 1 drop into both eyes 2 (two) times daily.   furosemide 20 MG tablet Commonly known as: LASIX Take 20 mg by mouth daily.   hydrochlorothiazide 25 MG tablet Commonly known as: HYDRODIURIL Take 25 mg by mouth daily.   ibuprofen 200 MG tablet Commonly known as: ADVIL Take 400-600 mg by mouth every 6 (six) hours as needed for headache or moderate pain.   levothyroxine 75 MCG tablet Commonly known as: SYNTHROID Take 75 mcg by mouth daily before breakfast.   loratadine 10 MG tablet Commonly known as: CLARITIN Take 10 mg by mouth daily.   losartan 100 MG tablet Commonly known as: COZAAR Take  100 mg by mouth every morning.   lovastatin 40 MG tablet Commonly known as: MEVACOR Take 40 mg by mouth at bedtime.   Lumigan 0.01 % Soln Generic drug: bimatoprost Place 1 drop into both eyes at bedtime.   metoprolol tartrate 100 MG tablet Commonly known as: LOPRESSOR Take 100 mg by mouth 2 (two) times daily.   pantoprazole 40 MG tablet Commonly known as: PROTONIX Take 40 mg by mouth at bedtime.   potassium chloride SA 20 MEQ tablet Commonly known as: KLOR-CON M Take 20 mEq by mouth 2 (two) times daily.   prenatal multivitamin Tabs tablet Take 1 tablet by mouth daily at 12 noon.        Allergies:  Allergies  Allergen Reactions   Ativan [Lorazepam] Other (See Comments)    Hysteria    Cardura [Doxazosin] Other (See Comments)    unsure   Clonidine Derivatives Other (See Comments)    unsure   Enalapril Other (See Comments)    unsure   Levaquin [Levofloxacin] Other (See Comments)    Couldn't raise her arms   Mobic [Meloxicam] Other (See Comments)    unsure    Sulfa Antibiotics Nausea Only   Vicodin [Hydrocodone-Acetaminophen] Other (See Comments)    unsure    Family History: Family History  Problem Relation Age of Onset   Breast cancer Mother 30   Bladder Cancer Father    Kidney Stones Brother    Skin cancer Brother        worked on farm   Prostate cancer Brother        dx late 11s   Esophageal cancer Sister 75   Skin cancer Sister    Skin cancer Daughter    Breast cancer Daughter 29   Skin cancer Daughter    Melanoma Daughter     Social History:  reports that she has never smoked. She has never used smokeless tobacco. She reports that she does not drink alcohol and does not use drugs.   Physical Exam: BP (!) 150/74   Pulse 65   Ht '5\' 1"'$  (1.549 m)   Wt 197 lb (89.4 kg)   BMI 37.22 kg/m   Constitutional:  Alert and oriented, No acute distress. HEENT: [place] AT, moist mucus membranes.  Trachea midline, no masses. Cardiovascular: No clubbing, cyanosis, or edema. Respiratory: Normal respiratory effort, no increased work of breathing. Skin: No rashes, bruises or suspicious lesions. Neurologic: Grossly intact, no focal deficits, moving all 4 extremities. Psychiatric: Normal mood and affect.  Laboratory Data: Lab Results  Component Value Date   CREATININE 1.10 (H) 06/07/2021    Urinalysis 11-30 WBCs, nitrite negative, moderate bacteria   Assessment & Plan:    Acute cystitis  - UA grossly positive  - Will defer cysto until treated  - Will send for culture and treat with Keflex in the interim   Conley Rolls as a scribe for Hollice Espy, MD.,have documented all relevant documentation on the behalf of Hollice Espy, MD,as directed by  Hollice Espy, MD while in the presence of Hollice Espy, MD.  I have reviewed the above documentation for accuracy and completeness, and I agree with the above.   Hollice Espy, MD   [facility] [address] [place], [place]  [phone]

## 2021-11-16 LAB — CULTURE, URINE COMPREHENSIVE

## 2021-11-27 ENCOUNTER — Encounter: Payer: Self-pay | Admitting: Urology

## 2021-11-27 ENCOUNTER — Other Ambulatory Visit: Payer: Medicare PPO | Admitting: Urology

## 2021-11-27 ENCOUNTER — Ambulatory Visit: Payer: Medicare PPO | Admitting: Urology

## 2021-11-27 VITALS — BP 155/71 | HR 70 | Ht 62.0 in | Wt 195.0 lb

## 2021-11-27 DIAGNOSIS — Z8551 Personal history of malignant neoplasm of bladder: Secondary | ICD-10-CM

## 2021-11-27 DIAGNOSIS — C679 Malignant neoplasm of bladder, unspecified: Secondary | ICD-10-CM

## 2021-11-27 LAB — MICROSCOPIC EXAMINATION: Bacteria, UA: NONE SEEN

## 2021-11-27 LAB — URINALYSIS, COMPLETE
Bilirubin, UA: NEGATIVE
Glucose, UA: NEGATIVE
Ketones, UA: NEGATIVE
Leukocytes,UA: NEGATIVE
Nitrite, UA: NEGATIVE
Protein,UA: NEGATIVE
Specific Gravity, UA: 1.015 (ref 1.005–1.030)
Urobilinogen, Ur: 0.2 mg/dL (ref 0.2–1.0)
pH, UA: 6.5 (ref 5.0–7.5)

## 2021-11-27 NOTE — Progress Notes (Signed)
   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   Patient was admitted on 12/03/2015 with fevers, right flank pain found to have an obstructing right distal 5 mm ureteral stone. She was taken to the operating room for cystoscopy, right ureteral stent placement in the setting of worsening leukocytosis and uncontrolled pain.  Intraoperatively, an incidental bladder tumor was identified.     She returned to the operating room on 01/02/2016 for definitive management of her stone via ureteroscopy, laser lithotripsy followed by TURBT. Surgical pathology was consistent with low-grade noninvasive bladder cancer. 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   Cysto in office on 05/01/2016 showed a very small low-grade appearing recurrence ~2 mm, anterior bladder wall in close proximity to the bladder neck. She underwent cystoscopy, fulguration of this lesion in the office shortly thereafter which was well tolerated.     Patient  had concern for possible early recurrence on 10/2017 with some subtle carpeting around the stellate scar as well as focal area of erythema.  She was taken to the operating room shortly thereafter at which time no pathology was identified.  Bilateral retrogrades were unremarkable.      Her cystoscopy was deferred on 11/13/2021 due to UTI. Urinalysis had 11-30 WBC's nitrate positive moderate bacteria. Urine culture had no growth.    Vitals:   11/27/21 1424  BP: (!) 155/71  Pulse: 70   NED. A&Ox3.   No respiratory distress   Abd soft, NT, ND Normal external genitalia with patent urethral meatus  Cystoscopy Procedure Note  Patient identification was confirmed, informed consent was obtained, and patient was prepped using  Betadine solution.  Lidocaine jelly was administered per urethral meatus.    Procedure: - Flexible cystoscope introduced, without any difficulty.   - Thorough search of the bladder revealed:    normal urethral meatus    normal urothelium    no stones    no ulcers     no tumors. 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  Post-Procedure: - Patient tolerated the procedure well  Assessment/ Plan:  History of bladder cancer  - NED today  - will plan surveillance in 1 year with cystoscopy    Conley Rolls as a scribe for Hollice Espy, MD.,have documented all relevant documentation on the behalf of Hollice Espy, MD,as directed by  Hollice Espy, MD while in the presence of Hollice Espy, MD.  I have reviewed the above documentation for accuracy and completeness, and I agree with the above.   Hollice Espy, MD

## 2022-03-18 ENCOUNTER — Inpatient Hospital Stay: Payer: Medicare PPO | Attending: Oncology | Admitting: Oncology

## 2022-03-18 ENCOUNTER — Encounter: Payer: Self-pay | Admitting: Oncology

## 2022-03-18 VITALS — BP 172/74 | HR 63 | Temp 96.9°F | Wt 196.4 lb

## 2022-03-18 DIAGNOSIS — I4891 Unspecified atrial fibrillation: Secondary | ICD-10-CM | POA: Insufficient documentation

## 2022-03-18 DIAGNOSIS — E039 Hypothyroidism, unspecified: Secondary | ICD-10-CM | POA: Diagnosis not present

## 2022-03-18 DIAGNOSIS — Z8551 Personal history of malignant neoplasm of bladder: Secondary | ICD-10-CM | POA: Insufficient documentation

## 2022-03-18 DIAGNOSIS — Z7982 Long term (current) use of aspirin: Secondary | ICD-10-CM | POA: Insufficient documentation

## 2022-03-18 DIAGNOSIS — E785 Hyperlipidemia, unspecified: Secondary | ICD-10-CM | POA: Diagnosis not present

## 2022-03-18 DIAGNOSIS — I10 Essential (primary) hypertension: Secondary | ICD-10-CM | POA: Diagnosis not present

## 2022-03-18 DIAGNOSIS — Z923 Personal history of irradiation: Secondary | ICD-10-CM | POA: Diagnosis not present

## 2022-03-18 DIAGNOSIS — Z79899 Other long term (current) drug therapy: Secondary | ICD-10-CM | POA: Diagnosis not present

## 2022-03-18 DIAGNOSIS — Z86000 Personal history of in-situ neoplasm of breast: Secondary | ICD-10-CM | POA: Diagnosis present

## 2022-03-18 DIAGNOSIS — D0512 Intraductal carcinoma in situ of left breast: Secondary | ICD-10-CM

## 2022-03-18 NOTE — Assessment & Plan Note (Addendum)
#  Left breast DCIS, patient is doing very well clinically. She finished 5 years of antiestrogen treatments. 03/22/2021, bilateral screening mammogram showed no mammographic evidence of malignancy.  Continue annual mammogram surveillance. Continue annual mammogram Patient prefers to continue follow-up annually with breast physical examination, will continue follow-up with her.

## 2022-03-18 NOTE — Progress Notes (Signed)
Hematology/Oncology Progress note [phone] [phone]     ASSESSMENT & PLAN:   Ductal carcinoma in situ (DCIS) of left breast #Left breast DCIS, patient is doing very well clinically. She finished 5 years of antiestrogen treatments. 03/22/2021, bilateral screening mammogram showed no mammographic evidence of malignancy.  Continue annual mammogram surveillance. Continue annual mammogram Patient prefers to continue follow-up annually with breast physical examination, will continue follow-up with her.  History of bladder cancer noninvasive bladder cancer Patient follow-up with urology Dr. Erlene Quan   Orders Placed This Encounter  Procedures   MM 3D SCREEN BREAST BILATERAL    Standing Status:   Future    Order Specific Question:   Reason for Exam (SYMPTOM  OR DIAGNOSIS REQUIRED)    Answer:   Breast Cancer    Order Specific Question:   Preferred imaging location?    Answer:   [facility]   Follow up in 1 year All questions were answered. The patient knows to call the clinic with any problems, questions or concerns.  Earlie Server, MD, PhD [facility] Hematology Oncology 03/18/2022    Chief Complaint: Alyssa Duncan is a 81 y.o. female with left breast DCIS who is seen for follow up   Alyssa Duncan is a 81 y.o.afemale previously followed up with Dr. Mike Gip.  She switched care to me on 12/17/2018. 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  Pathology revealed a 3.6 cm area of grade II ductal carcinoma in situ. Margins were clear but close at 1 mm.  DCIS was ER + (> 90%), PR + (> 90%).  Pathologic stage was TisNx.  Status post adjuvant radiation which was completed in 06/2012.    Antiestrogen treatments : she was initially treated with Femara, but discontinued secondary to joint pains.  She tried tamoxifen, but discontinued secondary to swelling.  She began Aromasin  since 11/16/2012.  Stopped Aromasin in September 2019 after finished total of 5 years of antiestrogen treatments.  Bilateral diagnostic mammogram on 03/24/2016 revealed no evidence of malignancy.  Diagnostic left mammogram and ultrasound on 06/09/2016 revealed an irregular hypoechogenicity in the left breast at 6 o'clock 2 cm from the nipple measuring 9 x 7 x 10 mm. This was felt likely to represent post treatment change but underlying malignancy could not be excluded.  Ultrasound of the left axilla revealed no enlarged adenopathy.   Ultrasound guided biopsy on 06/19/2016 revealed changes c/w organizing fat necrosis.  Bilateral diagnostic mammogram on 03/13/2017 that revealed a suspicious 6 mm mass in the superficial upper outer left breast.  Biopsy on 03/24/2017 results revealed fat necrosis with dystrophic calcification.   Bone density on 08/22/2015 was normal with a T-score of -1.0 in the left femur.  Bone density on 08/25/2017 was normal with a T-score of -0.8 in the femoral neck.  #, 2017, history of non-muscle invasive bladder cancer.  She underwent TURBT on 01/02/2016.  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  Cystoscopy on 05/01/2016 revealed a very small low-grade appearing recurrence.  She underwent cystoscopy and fulguration on 05/06/2016.  Cystoscopy on 04/29/2017 revealed no evidence of disease.  Next cystoscopy planned for 07/28/2017.  # She has stopped Arimidex per Dr. Kem Parkinson recommendation after being seen in September 2020  Alyssa Duncan is a 81 y.o. female who has above history reviewed by me today presents for follow up visit for management of history of DCIS  Problems and complaints are listed below: She finished 5 years of endocrine therapy.  Patient reports feeling well.  She denies any new breast concerns.  She has chronic intermittent soreness of the left breast lumpectomy sites.  No new complaints.   Past Medical  History:  Diagnosis Date   Acute biliary pancreatitis    Arthritis    Asthma    Back pain    Bladder cancer ([place])    Breast cancer ([place]) 2013   left breast ductal and lobular carcinoma   Choledocholithiasis    Difficult intubation    Dysrhythmia 12/2015   A-fib   Family history of adverse reaction to anesthesia    dad had hard time waking up from anesthesia   Family history of bladder cancer    Family history of breast cancer    Family history of prostate cancer    Family history of skin cancer    Gallstone pancreatitis 05/13/2017   GERD (gastroesophageal reflux disease)    History of kidney stones    Hyperlipidemia    Hypertension    Hypothyroidism    Kidney stones 12/2015   Personal history of radiation therapy 2013   BREAST CA   Shingles    Skin cancer 2005   left arm and nose   Sleep apnea     Past Surgical History:  Procedure Laterality Date   BREAST BIOPSY Left 2013   POS   BREAST BIOPSY Left 2018   benign fat necrosis   BREAST LUMPECTOMY Left 2013   with rad tx   CARPAL TUNNEL RELEASE     CATARACT EXTRACTION     CHOLECYSTECTOMY N/A 05/15/2017   Procedure: LAPAROSCOPIC CHOLECYSTECTOMY WITH INTRAOPERATIVE CHOLANGIOGRAM;  Surgeon: Vickie Epley, MD;  Location: [facility] OR[facility];  Service: General;  Laterality: N/A;   COLONOSCOPY WITH PROPOFOL N/A 05/10/2015   Procedure: COLONOSCOPY WITH PROPOFOL;  Surgeon: Hulen Luster, MD;  Location: [facility];  Service: Gastroenterology;  Laterality: N/A;   CYSTOSCOPY W/ RETROGRADES Bilateral 01/02/2016   Procedure: CYSTOSCOPY WITH RETROGRADE PYELOGRAM;  Surgeon: Hollice Espy, MD;  Location: [facility];  Service: Urology;  Laterality: Bilateral;   CYSTOSCOPY W/ RETROGRADES Bilateral 11/04/2017   Procedure: CYSTOSCOPY WITH RETROGRADE PYELOGRAM;  Surgeon: Hollice Espy, MD;  Location: [facility];  Service: Urology;  Laterality: Bilateral;   CYSTOSCOPY W/ URETERAL STENT PLACEMENT Right 01/02/2016   Procedure: CYSTOSCOPY WITH STENT  REPLACEMENT;  Surgeon: Hollice Espy, MD;  Location: [facility];  Service: Urology;  Laterality: Right;   CYSTOSCOPY WITH STENT PLACEMENT Right 12/04/2015   Procedure: CYSTOSCOPY WITH STENT PLACEMENT;  Surgeon: Hollice Espy, MD;  Location: [facility];  Service: Urology;  Laterality: Right;   ENDOSCOPIC RETROGRADE CHOLANGIOPANCREATOGRAPHY (ERCP) WITH PROPOFOL N/A 05/14/2017   Procedure: ENDOSCOPIC RETROGRADE CHOLANGIOPANCREATOGRAPHY (ERCP) WITH PROPOFOL;  Surgeon: Lucilla Lame, MD;  Location: [facility];  Service: Endoscopy;  Laterality: N/A;   JOINT REPLACEMENT Left 2013   hip   KNEE ARTHROSCOPY     SEPTOPLASTY     TRANSURETHRAL RESECTION OF BLADDER TUMOR WITH MITOMYCIN-C N/A 01/02/2016   Procedure: TRANSURETHRAL RESECTION OF BLADDER TUMOR WITH MITOMYCIN-C;  Surgeon: Hollice Espy, MD;  Location: [facility];  Service: Urology;  Laterality: N/A;   URETEROSCOPY WITH HOLMIUM LASER LITHOTRIPSY Right 01/02/2016   Procedure: URETEROSCOPY WITH HOLMIUM LASER LITHOTRIPSY;  Surgeon: Hollice Espy, MD;  Location: [facility];  Service: Urology;  Laterality: Right;   UVULOPALATOPHARYNGOPLASTY     and tongue surgery    Family History  Problem Relation Age of Onset  Breast cancer Mother 54   Bladder Cancer Father    Kidney Stones Brother    Skin cancer Brother        worked on farm   Prostate cancer Brother        dx late 27s   Esophageal cancer Sister 57   Skin cancer Sister    Skin cancer Daughter    Breast cancer Daughter 26   Skin cancer Daughter    Melanoma Daughter     Social History:  reports that she has never smoked. She has never used smokeless tobacco. She reports that she does not drink alcohol and does not use drugs.  The patient is alone today.  Allergies:  Allergies  Allergen Reactions   Ativan [Lorazepam] Other (See Comments)    Hysteria    Cardura [Doxazosin] Other (See Comments)    unsure   Clonidine Derivatives Other (See Comments)    unsure   Enalapril Other (See  Comments)    unsure   Levaquin [Levofloxacin] Other (See Comments)    Couldn't raise her arms   Mobic [Meloxicam] Other (See Comments)    unsure   Sulfa Antibiotics Nausea Only   Vicodin [Hydrocodone-Acetaminophen] Other (See Comments)    unsure    Current Medications: Current Outpatient Medications  Medication Sig Dispense Refill   acetaminophen (TYLENOL) 500 MG tablet Take 1,000 mg by mouth 2 (two) times daily.     amLODipine (NORVASC) 10 MG tablet Take 10 mg by mouth daily.     aspirin EC 81 MG tablet Take 1 tablet (81 mg total) by mouth daily. 90 tablet 3   Calcium Carb-Cholecalciferol 600-800 MG-UNIT TABS Take 1 tablet by mouth 2 (two) times daily.     docusate sodium (COLACE) 50 MG capsule Take 50 mg by mouth daily.     dorzolamide-timolol (COSOPT) 22.3-6.8 MG/ML ophthalmic solution Place 1 drop into both eyes 2 (two) times daily.      furosemide (LASIX) 20 MG tablet Take 20 mg by mouth daily.     hydrochlorothiazide (HYDRODIURIL) 25 MG tablet Take 1 tablet by mouth daily.     ibuprofen (ADVIL,MOTRIN) 200 MG tablet Take 400-600 mg by mouth every 6 (six) hours as needed for headache or moderate pain.      levothyroxine (SYNTHROID, LEVOTHROID) 75 MCG tablet Take 75 mcg by mouth daily before breakfast.     loratadine (CLARITIN) 10 MG tablet Take 10 mg by mouth daily.     losartan (COZAAR) 100 MG tablet Take 100 mg by mouth every morning.      lovastatin (MEVACOR) 40 MG tablet Take 40 mg by mouth at bedtime.     LUMIGAN 0.01 % SOLN Place 1 drop into both eyes at bedtime.      metoprolol tartrate (LOPRESSOR) 100 MG tablet Take 100 mg by mouth 2 (two) times daily.     pantoprazole (PROTONIX) 40 MG tablet Take 40 mg by mouth at bedtime.      potassium chloride SA (K-DUR,KLOR-CON) 20 MEQ tablet Take 20 mEq by mouth 2 (two) times daily.     Prenatal Vit-Fe Fumarate-FA (PRENATAL MULTIVITAMIN) TABS tablet Take 1 tablet by mouth daily at 12 noon.     vitamin B-12 (CYANOCOBALAMIN) 1000 MCG  tablet Take 1,000 mcg by mouth daily.     vitamin C (ASCORBIC ACID) 500 MG tablet Take 500 mg by mouth daily.     Current Facility-Administered Medications  Medication Dose Route Frequency Provider Last Rate Last Admin   lidocaine (XYLOCAINE)  2 % jelly 1 application  1 application  Urethral Once Hollice Espy, MD       Review of Systems  Constitutional:  Negative for appetite change, chills, fatigue and fever.  HENT:   Negative for hearing loss and voice change.   Eyes:  Negative for eye problems.  Respiratory:  Negative for chest tightness and cough.   Cardiovascular:  Negative for chest pain.  Gastrointestinal:  Negative for abdominal distention, abdominal pain and blood in stool.  Endocrine: Negative for hot flashes.  Genitourinary:  Negative for difficulty urinating and frequency.   Skin:  Negative for itching and rash.  Neurological:  Negative for extremity weakness.  Hematological:  Negative for adenopathy.  Psychiatric/Behavioral:  Negative for confusion.      Physical Exam: Blood pressure (!) 172/74, pulse 63, temperature (!) 96.9 F (36.1 C), temperature source Tympanic, weight 196 lb 6.4 oz (89.1 kg), SpO2 100 %.  Physical Exam Constitutional:      General: She is not in acute distress.    Appearance: She is not diaphoretic.     Comments: Patient walks independently  HENT:     Head: Normocephalic and atraumatic.     Nose: Nose normal.     Mouth/Throat:     Pharynx: No oropharyngeal exudate.  Eyes:     General: No scleral icterus.    Pupils: Pupils are equal, round, and reactive to light.  Cardiovascular:     Rate and Rhythm: Normal rate and regular rhythm.     Heart sounds: No murmur heard. Pulmonary:     Effort: Pulmonary effort is normal. No respiratory distress.     Breath sounds: No rales.  Chest:     Chest wall: No tenderness.  Abdominal:     General: There is no distension.     Palpations: Abdomen is soft.     Tenderness: There is no abdominal  tenderness.  Musculoskeletal:        General: Normal range of motion.     Cervical back: Normal range of motion and neck supple.  Skin:    General: Skin is warm and dry.     Findings: No erythema.  Neurological:     Mental Status: She is alert and oriented to person, place, and time.     Cranial Nerves: No cranial nerve deficit.     Motor: No abnormal muscle tone.     Coordination: Coordination normal.  Psychiatric:        Mood and Affect: Affect normal.    Breast exam was performed in seated and lying down position. Patient is status post left lumpectomy with a well-healed surgical scar.  Left breast post-operative changes between 6 o'clock and 6:30 position.  Left nipple indentation with scar.  Right breast no palpable mass. Chronically retracted nipple. No palpable masses or axillary adenopathy bilaterally.    No visits with results within 3 Day(s) from this visit.  Latest known visit with results is:  Procedure visit on 11/27/2021  Component Date Value Ref Range Status   Specific Gravity, UA 11/27/2021 1.015  1.005 - 1.030 Final   pH, UA 11/27/2021 6.5  5.0 - 7.5 Final   Color, UA 11/27/2021 Yellow  Yellow Final   Appearance Ur 11/27/2021 Clear  Clear Final   Leukocytes,UA 11/27/2021 Negative  Negative Final   Protein,UA 11/27/2021 Negative  Negative/Trace Final   Glucose, UA 11/27/2021 Negative  Negative Final   Ketones, UA 11/27/2021 Negative  Negative Final   RBC, UA 11/27/2021 Trace (A)  Negative Final   Bilirubin, UA 11/27/2021 Negative  Negative Final   Urobilinogen, Ur 11/27/2021 0.2  0.2 - 1.0 mg/dL Final   Nitrite, UA 11/27/2021 Negative  Negative Final   Microscopic Examination 11/27/2021 See below:   Final   WBC, UA 11/27/2021 0-5  0 - 5 /hpf Final   RBC, Urine 11/27/2021 0-2  0 - 2 /hpf Final   Epithelial Cells (non renal) 11/27/2021 0-10  0 - 10 /hpf Final   Bacteria, UA 11/27/2021 None seen  None seen/Few Final

## 2022-03-18 NOTE — Assessment & Plan Note (Signed)
noninvasive bladder cancer Patient follow-up with urology Dr. Erlene Quan

## 2022-03-20 ENCOUNTER — Ambulatory Visit: Payer: Medicare PPO | Attending: Cardiovascular Disease | Admitting: Cardiovascular Disease

## 2022-03-20 ENCOUNTER — Encounter: Payer: Self-pay | Admitting: Cardiovascular Disease

## 2022-03-20 VITALS — BP 170/74 | HR 67 | Ht 62.0 in | Wt 196.5 lb

## 2022-03-20 DIAGNOSIS — R0602 Shortness of breath: Secondary | ICD-10-CM | POA: Diagnosis not present

## 2022-03-20 DIAGNOSIS — I480 Paroxysmal atrial fibrillation: Secondary | ICD-10-CM

## 2022-03-20 DIAGNOSIS — I2510 Atherosclerotic heart disease of native coronary artery without angina pectoris: Secondary | ICD-10-CM

## 2022-03-20 DIAGNOSIS — E785 Hyperlipidemia, unspecified: Secondary | ICD-10-CM

## 2022-03-20 DIAGNOSIS — I10 Essential (primary) hypertension: Secondary | ICD-10-CM | POA: Diagnosis not present

## 2022-03-20 NOTE — Patient Instructions (Signed)
Medication Instructions:  No changes *If you need a refill on your cardiac medications before your next appointment, please call your pharmacy*   Lab Work: None ordered If you have labs (blood work) drawn today and your tests are completely normal, you will receive your results only by: [facility] (if you have MyChart) OR A paper copy in the mail If you have any lab test that is abnormal or we need to change your treatment, we will call you to review the results.   Testing/Procedures: Your physician has requested that you have an echocardiogram. Echocardiography is a painless test that uses sound waves to create images of your heart. It provides your doctor with information about the size and shape of your heart and how well your heart's chambers and valves are working.   You may receive an ultrasound enhancing agent through an IV if needed to better visualize your heart during the echo. This procedure takes approximately one hour.  There are no restrictions for this procedure.  This will take place at [facility] ([place]) [id number], [place]    Follow-Up: At [facility], you and your health needs are our priority.  As part of our continuing mission to provide you with exceptional heart care, we have created designated Provider Care Teams.  These Care Teams include your primary Cardiologist (physician) and Advanced Practice Providers (APPs -  Physician Assistants and Nurse Practitioners) who all work together to provide you with the care you need, when you need it.  We recommend signing up for the patient portal called "MyChart".  Sign up information is provided on this After Visit Summary.  MyChart is used to connect with patients for Virtual Visits (Telemedicine).  Patients are able to view lab/test results, encounter notes, upcoming appointments, etc.  Non-urgent messages can be sent to your provider as well.   To learn more about what you can  do with MyChart, go to [email or web address].    Your next appointment:   12 month(s)  The format for your next appointment:   In Person  Provider:   You may see Dr. Fletcher Anon or one of the following Advanced Practice Providers on your designated Care Team:   Murray Hodgkins, NP Christell Faith, PA-C Cadence Kathlen Mody, PA-C Gerrie Nordmann, NP    Important Information About Sugar

## 2022-03-20 NOTE — Progress Notes (Signed)
Cardiology Office Note   Date:  03/20/2022   ID:  Alyssa Duncan, DOB 21-Jun-1940, MRN [id number]  PCP:  Adin Hector, MD  Cardiologist:   Kathlyn Sacramento, MD   Chief Complaint  Patient presents with   OTHER    12 month f/u c/o sob with exertion and pt would like to discuss CAD due to Fam hx. Meds reviewed verbally with pt.      History of Present Illness: Alyssa Duncan is a 81 y.o. female who is here today for a follow-up visit regarding coronary artery disease.   She was seen by me many years ago for 1 episode of atrial fibrillation with RVR in 2017 in the setting of sepsis and bacteremia due to obstructive urethral stones.  She had an echocardiogram done which showed normal LV systolic function, mild mitral and aortic regurgitation and normal atrial size. Pulmonary pressure was normal. She had no recurrent arrhythmia off amiodarone. She was taken off anticoagulation given that atrial fibrillation was in the setting of acute illness.  She had no recurrent atrial fibrillation since then. Other medical problems include essential hypertension, hyperlipidemia, hypothyroidism and chronic venous insufficiency. She was seen last year for left arm discomfort.  Cardiac CTA showed evidence of aortic atherosclerosis as well as coronary calcifications with calcium score of 452 with moderate proximal LAD stenosis and mild ostial RCA stenosis.  CT FFR was negative.  Left arm discomfort was felt to be musculoskeletal.  She has been doing reasonably well with no chest pain or palpitations.  No dizziness or syncope.  She does report worsening exertional dyspnea and she is noted to be left bundle branch block today which is new.  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   Choledocholithiasis    Difficult intubation    Dysrhythmia  12/2015   A-fib   Family history of adverse reaction to anesthesia    dad had hard time waking up from anesthesia   Family history of bladder cancer    Family history of breast cancer    Family history of prostate cancer    Family history of skin cancer    Gallstone pancreatitis 05/13/2017   GERD (gastroesophageal reflux disease)    History of kidney stones    Hyperlipidemia    Hypertension    Hypothyroidism    Kidney stones 12/2015   Personal history of radiation therapy 2013   BREAST CA   Shingles    Skin cancer 2005   left arm and nose   Sleep apnea     Past Surgical History:  Procedure Laterality Date   BREAST BIOPSY Left 2013   POS   BREAST BIOPSY Left 2018   benign fat necrosis   BREAST LUMPECTOMY Left 2013   with rad tx   CARPAL TUNNEL RELEASE     CATARACT EXTRACTION     CHOLECYSTECTOMY N/A 05/15/2017   Procedure: LAPAROSCOPIC CHOLECYSTECTOMY WITH INTRAOPERATIVE CHOLANGIOGRAM;  Surgeon: Vickie Epley, MD;  Location: [facility];  Service: General;  Laterality: N/A;   COLONOSCOPY WITH PROPOFOL N/A 05/10/2015   Procedure: COLONOSCOPY WITH PROPOFOL;  Surgeon: Hulen Luster, MD;  Location: [facility];  Service: Gastroenterology;  Laterality: N/A;   CYSTOSCOPY W/ RETROGRADES Bilateral 01/02/2016   Procedure: CYSTOSCOPY WITH  RETROGRADE PYELOGRAM;  Surgeon: Hollice Espy, MD;  Location: [facility];  Service: Urology;  Laterality: Bilateral;   CYSTOSCOPY W/ RETROGRADES Bilateral 11/04/2017   Procedure: CYSTOSCOPY WITH RETROGRADE PYELOGRAM;  Surgeon: Hollice Espy, MD;  Location: [facility];  Service: Urology;  Laterality: Bilateral;   CYSTOSCOPY W/ URETERAL STENT PLACEMENT Right 01/02/2016   Procedure: CYSTOSCOPY WITH STENT REPLACEMENT;  Surgeon: Hollice Espy, MD;  Location: [facility];  Service: Urology;  Laterality: Right;   CYSTOSCOPY WITH STENT PLACEMENT Right 12/04/2015   Procedure: CYSTOSCOPY WITH STENT PLACEMENT;  Surgeon: Hollice Espy, MD;  Location: [facility];  Service:  Urology;  Laterality: Right;   ENDOSCOPIC RETROGRADE CHOLANGIOPANCREATOGRAPHY (ERCP) WITH PROPOFOL N/A 05/14/2017   Procedure: ENDOSCOPIC RETROGRADE CHOLANGIOPANCREATOGRAPHY (ERCP) WITH PROPOFOL;  Surgeon: Lucilla Lame, MD;  Location: [facility];  Service: Endoscopy;  Laterality: N/A;   JOINT REPLACEMENT Left 2013   hip   KNEE ARTHROSCOPY     SEPTOPLASTY     TRANSURETHRAL RESECTION OF BLADDER TUMOR WITH MITOMYCIN-C N/A 01/02/2016   Procedure: TRANSURETHRAL RESECTION OF BLADDER TUMOR WITH MITOMYCIN-C;  Surgeon: Hollice Espy, MD;  Location: [facility];  Service: Urology;  Laterality: N/A;   URETEROSCOPY WITH HOLMIUM LASER LITHOTRIPSY Right 01/02/2016   Procedure: URETEROSCOPY WITH HOLMIUM LASER LITHOTRIPSY;  Surgeon: Hollice Espy, MD;  Location: [facility];  Service: Urology;  Laterality: Right;   UVULOPALATOPHARYNGOPLASTY     and tongue surgery     Current Outpatient Medications  Medication Sig Dispense Refill   acetaminophen (TYLENOL) 500 MG tablet Take 1,000 mg by mouth 2 (two) times daily.     amLODipine (NORVASC) 10 MG tablet Take 10 mg by mouth daily.     aspirin EC 81 MG tablet Take 1 tablet (81 mg total) by mouth daily. 90 tablet 3   Calcium Carb-Cholecalciferol 600-800 MG-UNIT TABS Take 1 tablet by mouth 2 (two) times daily.     docusate sodium (COLACE) 50 MG capsule Take 50 mg by mouth daily.     dorzolamide-timolol (COSOPT) 22.3-6.8 MG/ML ophthalmic solution Place 1 drop into both eyes 2 (two) times daily.      furosemide (LASIX) 20 MG tablet Take 20 mg by mouth daily.     hydrochlorothiazide (HYDRODIURIL) 25 MG tablet Take 1 tablet by mouth daily.     ibuprofen (ADVIL,MOTRIN) 200 MG tablet Take 400-600 mg by mouth every 6 (six) hours as needed for headache or moderate pain.      levothyroxine (SYNTHROID, LEVOTHROID) 75 MCG tablet Take 75 mcg by mouth daily before breakfast.     loratadine (CLARITIN) 10 MG tablet Take 10 mg by mouth daily.     losartan (COZAAR) 100 MG tablet  Take 100 mg by mouth every morning.      lovastatin (MEVACOR) 40 MG tablet Take 40 mg by mouth at bedtime.     LUMIGAN 0.01 % SOLN Place 1 drop into both eyes at bedtime.      metoprolol tartrate (LOPRESSOR) 100 MG tablet Take 100 mg by mouth 2 (two) times daily.     pantoprazole (PROTONIX) 40 MG tablet Take 40 mg by mouth at bedtime.      potassium chloride SA (K-DUR,KLOR-CON) 20 MEQ tablet Take 20 mEq by mouth 2 (two) times daily.     Prenatal Vit-Fe Fumarate-FA (PRENATAL MULTIVITAMIN) TABS tablet Take 1 tablet by mouth daily at 12 noon.     vitamin B-12 (CYANOCOBALAMIN) 1000 MCG tablet Take 1,000 mcg by mouth daily.     vitamin C (ASCORBIC ACID) 500 MG tablet  Take 500 mg by mouth daily.     Current Facility-Administered Medications  Medication Dose Route Frequency Provider Last Rate Last Admin   lidocaine (XYLOCAINE) 2 % jelly 1 application  1 application  Urethral Once Hollice Espy, MD        Allergies:   Ativan [lorazepam], Cardura [doxazosin], Clonidine derivatives, Enalapril, Levaquin [levofloxacin], Mobic [meloxicam], Sulfa antibiotics, and Vicodin [hydrocodone-acetaminophen]    Social History:  The patient  reports that she has never smoked. She has never used smokeless tobacco. She reports that she does not drink alcohol and does not use drugs.   Family History:  The patient's family history includes Bladder Cancer in her father; Breast cancer (age of onset: 73) in her daughter; Breast cancer (age of onset: 42) in her mother; Esophageal cancer (age of onset: 49) in her sister; Heart attack in her father; Heart disease in her daughter, father, and son; Kidney Stones in her brother; Melanoma in her daughter; Prostate cancer in her brother; Skin cancer in her brother, daughter, daughter, and sister.    ROS:  Please see the history of present illness.   Otherwise, review of systems are positive for none.   All other systems are reviewed and negative.    PHYSICAL EXAM: VS:  BP (!)  170/74 (BP Location: Left Arm, Patient Position: Sitting, Cuff Size: Large)   Pulse 67   Ht '5\' 2"'$  (1.575 m)   Wt 196 lb 8 oz (89.1 kg)   SpO2 98%   BMI 35.94 kg/m  , BMI Body mass index is 35.94 kg/m. GEN: Well nourished, well developed, in no acute distress  HEENT: normal  Neck: no JVD, carotid bruits, or masses Cardiac: RRR; no murmurs, rubs, or gallops,no edema  Respiratory:  clear to auscultation bilaterally, normal work of breathing GI: soft, nontender, nondistended, + BS MS: no deformity or atrophy  Skin: warm and dry, no rash Neuro:  Strength and sensation are intact Psych: euthymic mood, full affect   EKG:  EKG is ordered today. The ekg ordered today demonstrates normal sinus rhythm with left bundle branch block.  Recent Labs: 06/07/2021: Creatinine, Ser 1.10    Lipid Panel No results found for: "CHOL", "TRIG", "HDL", "CHOLHDL", "VLDL", "LDLCALC", "LDLDIRECT"    Wt Readings from Last 3 Encounters:  03/20/22 196 lb 8 oz (89.1 kg)  03/18/22 196 lb 6.4 oz (89.1 kg)  11/27/21 195 lb (88.5 kg)           No data to display            ASSESSMENT AND PLAN:  1.  Coronary artery disease involving native coronary arteries without angina: The patient is known to have mild to moderate nonobstructive coronary artery disease.  Currently with no anginal symptoms.  Continue low-dose aspirin and treatment of risk factors.    2. Paroxysmal atrial fibrillation: The patient had one episode of atrial fibrillation in the setting of sepsis and respiratory distress  In 2017.  No evidence of recurrent arrhythmia since then.  No need for long-term anticoagulation unless she develops recurrent atrial fibrillation.  3.  Essential hypertension: Her blood pressure was elevated initially.  However, I checked her blood pressure at the end of the visit and it was 128/80.  She does have a component of whitecoat syndrome.  4.  Hyperlipidemia: I reviewed most recent lipid profile done in  July which showed an LDL of 49.  Continue treatment with lovastatin.  5.  Left bundle branch block: This is new and  she does complain of increased exertional dyspnea.  I requested an echocardiogram.   Disposition:   FU with me in 12 months or earlier if echocardiogram is abnormal.  Signed,  Kathlyn Sacramento, MD  03/20/2022 9:46 AM    [place]

## 2022-03-24 ENCOUNTER — Ambulatory Visit
Admission: RE | Admit: 2022-03-24 | Discharge: 2022-03-24 | Disposition: A | Discharge status: Home / Self Care | Payer: Medicare PPO | Source: Ambulatory Visit | Attending: Oncology | Admitting: Oncology

## 2022-03-24 DIAGNOSIS — D0512 Intraductal carcinoma in situ of left breast: Secondary | ICD-10-CM | POA: Insufficient documentation

## 2022-03-24 DIAGNOSIS — Z1231 Encounter for screening mammogram for malignant neoplasm of breast: Secondary | ICD-10-CM | POA: Insufficient documentation

## 2022-03-26 ENCOUNTER — Other Ambulatory Visit: Payer: Self-pay | Admitting: Specialist

## 2022-03-26 ENCOUNTER — Other Ambulatory Visit: Payer: Self-pay | Admitting: Oncology

## 2022-03-26 DIAGNOSIS — N63 Unspecified lump in unspecified breast: Secondary | ICD-10-CM

## 2022-03-26 DIAGNOSIS — R928 Other abnormal and inconclusive findings on diagnostic imaging of breast: Secondary | ICD-10-CM

## 2022-03-26 DIAGNOSIS — R918 Other nonspecific abnormal finding of lung field: Secondary | ICD-10-CM

## 2022-04-08 ENCOUNTER — Ambulatory Visit
Admission: RE | Admit: 2022-04-08 | Discharge: 2022-04-08 | Disposition: A | Discharge status: Home / Self Care | Payer: Medicare PPO | Source: Ambulatory Visit | Attending: Specialist | Admitting: Specialist

## 2022-04-08 DIAGNOSIS — R918 Other nonspecific abnormal finding of lung field: Secondary | ICD-10-CM | POA: Insufficient documentation

## 2022-04-16 ENCOUNTER — Other Ambulatory Visit: Payer: Self-pay | Admitting: Oncology

## 2022-04-16 ENCOUNTER — Ambulatory Visit
Admission: RE | Admit: 2022-04-16 | Discharge: 2022-04-16 | Disposition: A | Discharge status: Home / Self Care | Payer: Medicare PPO | Source: Ambulatory Visit | Attending: Oncology | Admitting: Oncology

## 2022-04-16 DIAGNOSIS — R928 Other abnormal and inconclusive findings on diagnostic imaging of breast: Secondary | ICD-10-CM

## 2022-04-16 DIAGNOSIS — N63 Unspecified lump in unspecified breast: Secondary | ICD-10-CM

## 2022-04-24 ENCOUNTER — Ambulatory Visit
Admission: RE | Admit: 2022-04-24 | Discharge: 2022-04-24 | Disposition: A | Discharge status: Home / Self Care | Payer: Medicare PPO | Source: Ambulatory Visit | Attending: Oncology | Admitting: Oncology

## 2022-04-24 DIAGNOSIS — R928 Other abnormal and inconclusive findings on diagnostic imaging of breast: Secondary | ICD-10-CM | POA: Insufficient documentation

## 2022-04-24 DIAGNOSIS — N63 Unspecified lump in unspecified breast: Secondary | ICD-10-CM | POA: Insufficient documentation

## 2022-04-24 HISTORY — PX: BREAST BIOPSY: SHX20

## 2022-04-24 MED ORDER — LIDOCAINE-EPINEPHRINE 1 %-1:100000 IJ SOLN
8.0000 mL | Freq: Once | INTRAMUSCULAR | Status: AC
  Administered 2022-04-24: 8 mL
  Filled 2022-04-24: qty 8

## 2022-04-24 MED ORDER — LIDOCAINE HCL (PF) 1 % IJ SOLN
3.0000 mL | Freq: Once | INTRAMUSCULAR | Status: AC
  Administered 2022-04-24: 3 mL
  Filled 2022-04-24: qty 4

## 2022-04-25 ENCOUNTER — Other Ambulatory Visit: Payer: Self-pay | Admitting: Oncology

## 2022-04-25 DIAGNOSIS — R928 Other abnormal and inconclusive findings on diagnostic imaging of breast: Secondary | ICD-10-CM

## 2022-04-25 DIAGNOSIS — N63 Unspecified lump in unspecified breast: Secondary | ICD-10-CM

## 2022-04-25 LAB — SURGICAL PATHOLOGY

## 2022-05-01 ENCOUNTER — Ambulatory Visit
Admission: RE | Admit: 2022-05-01 | Discharge: 2022-05-01 | Disposition: A | Discharge status: Home / Self Care | Payer: Medicare PPO | Source: Ambulatory Visit | Attending: Oncology | Admitting: Oncology

## 2022-05-01 ENCOUNTER — Other Ambulatory Visit: Payer: Self-pay | Admitting: Oncology

## 2022-05-01 DIAGNOSIS — N6323 Unspecified lump in the left breast, lower outer quadrant: Secondary | ICD-10-CM | POA: Diagnosis present

## 2022-05-01 DIAGNOSIS — N63 Unspecified lump in unspecified breast: Secondary | ICD-10-CM

## 2022-05-01 DIAGNOSIS — R928 Other abnormal and inconclusive findings on diagnostic imaging of breast: Secondary | ICD-10-CM

## 2022-05-01 HISTORY — PX: BREAST BIOPSY: SHX20

## 2022-05-15 ENCOUNTER — Ambulatory Visit: Payer: Medicare PPO | Attending: Cardiovascular Disease

## 2022-05-15 DIAGNOSIS — R0602 Shortness of breath: Secondary | ICD-10-CM

## 2022-05-15 LAB — ECHOCARDIOGRAM COMPLETE
AR max vel: 2.45 cm2
AV Area VTI: 2.03 cm2
AV Area mean vel: 2.23 cm2
AV Mean grad: 3 mmHg
AV Peak grad: 6.1 mmHg
Ao pk vel: 1.23 m/s
Area-P 1/2: 2.54 cm2
Calc EF: 51 %
P 1/2 time: 561 msec
S' Lateral: 3 cm
Single Plane A2C EF: 46.9 %
Single Plane A4C EF: 53.5 %

## 2022-11-25 ENCOUNTER — Ambulatory Visit: Payer: Medicare PPO | Admitting: Urology

## 2022-11-25 ENCOUNTER — Other Ambulatory Visit: Payer: Self-pay

## 2022-11-25 VITALS — BP 147/78 | HR 63 | Ht 61.0 in | Wt 189.2 lb

## 2022-11-25 DIAGNOSIS — C679 Malignant neoplasm of bladder, unspecified: Secondary | ICD-10-CM

## 2022-11-25 DIAGNOSIS — Z08 Encounter for follow-up examination after completed treatment for malignant neoplasm: Secondary | ICD-10-CM | POA: Diagnosis not present

## 2022-11-25 DIAGNOSIS — N329 Bladder disorder, unspecified: Secondary | ICD-10-CM

## 2022-11-25 DIAGNOSIS — Z8551 Personal history of malignant neoplasm of bladder: Secondary | ICD-10-CM

## 2022-11-25 NOTE — Progress Notes (Signed)
   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   Patient was admitted on 12/03/2015 with fevers, right flank pain found to have an obstructing right distal 5 mm ureteral stone. She was taken to the operating room for cystoscopy, right ureteral stent placement in the setting of worsening leukocytosis and uncontrolled pain.  Intraoperatively, an incidental bladder tumor was identified.     She returned to the operating room on 01/02/2016 for definitive management of her stone via ureteroscopy, laser lithotripsy followed by TURBT. Surgical pathology was consistent with low-grade noninvasive bladder cancer. 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   Cysto in office on 05/01/2016 showed a very small low-grade appearing recurrence ~2 mm, anterior bladder wall in close proximity to the bladder neck. She underwent cystoscopy, fulguration of this lesion in the office shortly thereafter which was well tolerated.     Patient  had concern for possible early recurrence on 10/2017 with some subtle carpeting around the stellate scar as well as focal area of erythema.  She was taken to the operating room shortly thereafter at which time no pathology was identified.  Bilateral retrogrades were unremarkable.     Unable to void today.  She did give urinalysis at her PCP last month which was negative.  She is asymptomatic today.    NED. A&Ox3.   No respiratory distress   Abd soft, NT, ND Normal external genitalia with patent urethral meatus.  Prolapse reduced just prior to cystoscopy.  Cystoscopy Procedure Note  Patient identification was confirmed, informed consent was obtained, and patient was prepped using Betadine solution.  Lidocaine jelly was  administered per urethral meatus.    Procedure: - Flexible cystoscope introduced, without any difficulty.   - Thorough search of the bladder revealed:    normal urethral meatus    normal urothelium with inflammatory changes around the trigonal ridge.  There is a ridge extending on the right side linear posterior lateral from the trigone which also has inflammatory changes versus early papillary carpeting.    no stones    no ulcers        no urethral polyps       Post-Procedure: - Patient tolerated the procedure well  Assessment/ Plan:  History of bladder cancer /bladder lesion -Cystoscopy today with inflammatory changes along the trigone but a new linear area of inflammatory versus early papillary change extending along the ridge extending from the right trigone.  Unclear whether or not this represents inflammatory versus early malignant change.  We discussed risk and benefits of biopsy in detail.  She is most interested in biopsy.  Will also plan for retrograde pyelogram but abstain from gemcitabine given the this is less likely to represent malignancy.  She will need preop UA/urine culture.   Vanna Scotland, MD

## 2022-11-25 NOTE — Progress Notes (Unsigned)
Surgical Physician Order Form [facility] [place]  Dr. Apolinar Junes, MD  * Scheduling expectation : Next Available  *Length of Case:   *Clearance needed: no  *Anticoagulation Instructions: Hold all anticoagulants  *Aspirin Instructions: Ok to continue Aspirin  *Post-op visit Date/Instructions:  3 mo f/u cysto  *Diagnosis: Bladder Lesion  *Procedure: bilateral RTG, Cysto Bladder Biopsy (16109)   Additional orders: N/A  -Admit type: OUTpatient  -Anesthesia: General  -VTE Prophylaxis Standing Order SCD's       Other:   -Standing Lab Orders Per Anesthesia    Lab other: UA&Urine Culture  -Standing Test orders EKG/Chest x-ray per Anesthesia       Test other:   - Medications:  Ancef 2gm IV  -Other orders:  N/A

## 2022-11-26 ENCOUNTER — Telehealth: Payer: Self-pay

## 2022-11-26 NOTE — Progress Notes (Signed)
   [facility] [place] Surgical Posting Form  Surgery Date: Date: 01/05/2023  Surgeon: Dr. Vanna Scotland, MD  Inpt ( No  )   Outpt (Yes)   Obs ( No  )   Diagnosis: N32.9 Bladder Lesion   -CPT: 16109, [phone]  Surgery: Cystoscopy with Bladder Biopsy and Bilateral Retrograde Pyelograms  Stop Anticoagulations: Yes, may continue ASA  Cardiac/Medical/Pulmonary Clearance needed: no  Clearance needed: No  *Orders entered into EPIC  Date: 11/26/22   *Case booked in [place]  Date: 11/25/2022  *Notified pt of Surgery: Date: 11/25/2022  PRE-OP UA & CX: yes, will obtain in clinic on 12/25/2022  *Placed into Prior Authorization Work Angela Nevin Date: 11/26/22  Assistant/laser/rep:No

## 2022-11-26 NOTE — Telephone Encounter (Signed)
Per Dr. Apolinar Junes, Patient is to be scheduled for Cystoscopy with Bladder Biopsy and Bilateral Retrograde Pyelograms   Alyssa Duncan was contacted and possible surgical dates were discussed, Monday September 30th, 2024 was agreed upon for surgery.   Patient was instructed that Dr. Apolinar Junes will require them to provide a pre-op UA & CX prior to surgery. This was ordered and scheduled drop off appointment was made for 12/25/2022.    Patient was directed to call [phone] between 1-3pm the day before surgery to find out surgical arrival time.  Instructions were given not to eat or drink from midnight on the night before surgery and have a driver for the day of surgery. On the surgery day patient was instructed to enter through the Medical Mall entrance of [facility] report the Same Day Surgery desk.   Pre-Admit Testing will be in contact via phone to set up an interview with the anesthesia team to review your history and medications prior to surgery.   Reminder of this information was sent via MyChart to the patient.

## 2022-12-07 DIAGNOSIS — N329 Bladder disorder, unspecified: Secondary | ICD-10-CM

## 2022-12-07 HISTORY — DX: Bladder disorder, unspecified: N32.9

## 2022-12-25 ENCOUNTER — Other Ambulatory Visit: Payer: Medicare PPO

## 2022-12-25 DIAGNOSIS — N329 Bladder disorder, unspecified: Secondary | ICD-10-CM

## 2022-12-25 LAB — URINALYSIS, COMPLETE
Bilirubin, UA: NEGATIVE
Glucose, UA: NEGATIVE
Ketones, UA: NEGATIVE
Nitrite, UA: NEGATIVE
Specific Gravity, UA: 1.015 (ref 1.005–1.030)
Urobilinogen, Ur: 0.2 mg/dL (ref 0.2–1.0)
pH, UA: 7.5 (ref 5.0–7.5)

## 2022-12-25 LAB — MICROSCOPIC EXAMINATION

## 2022-12-26 ENCOUNTER — Telehealth: Payer: Self-pay

## 2022-12-26 ENCOUNTER — Encounter
Admission: RE | Admit: 2022-12-26 | Discharge: 2022-12-26 | Disposition: A | Discharge status: Home / Self Care | Payer: Medicare PPO | Source: Ambulatory Visit | Attending: Urology

## 2022-12-26 VITALS — Ht 61.0 in | Wt 189.2 lb

## 2022-12-26 DIAGNOSIS — I4891 Unspecified atrial fibrillation: Secondary | ICD-10-CM

## 2022-12-26 DIAGNOSIS — Z01810 Encounter for preprocedural cardiovascular examination: Secondary | ICD-10-CM

## 2022-12-26 DIAGNOSIS — Z01812 Encounter for preprocedural laboratory examination: Secondary | ICD-10-CM

## 2022-12-26 DIAGNOSIS — Z79899 Other long term (current) drug therapy: Secondary | ICD-10-CM

## 2022-12-26 DIAGNOSIS — I10 Essential (primary) hypertension: Secondary | ICD-10-CM

## 2022-12-26 HISTORY — DX: Atherosclerotic heart disease of native coronary artery without angina pectoris: I25.10

## 2022-12-26 HISTORY — DX: Acute kidney failure, unspecified: N17.9

## 2022-12-26 HISTORY — DX: Localized edema: R60.0

## 2022-12-26 HISTORY — DX: Sepsis, unspecified organism: A41.9

## 2022-12-26 HISTORY — DX: Personal history of other diseases of the digestive system: Z87.19

## 2022-12-26 HISTORY — DX: Obstructive sleep apnea (adult) (pediatric): G47.33

## 2022-12-26 HISTORY — DX: Atherosclerosis of aorta: I70.0

## 2022-12-26 HISTORY — DX: Left bundle-branch block, unspecified: I44.7

## 2022-12-26 HISTORY — DX: Obesity, unspecified: E66.9

## 2022-12-26 NOTE — Telephone Encounter (Signed)
1. What type of surgery is being performed?  CYSTOSCOPY WITH BLADDER BIOPSY; CYSTOSCOPY WITH RETROGRADE PYELOGRAM  2. When is this surgery scheduled?  01/05/2023    3. Type of clearance being requested (medical, pharmacy, both)?  MEDICAL    4. Are there any medications that need to be held prior to surgery?  NONE. Ok to continue ASA per Careers adviser.   5. Practice name and name of physician performing surgery?  Performing surgeon: Dr. Vanna Scotland, MD  Requesting clearance: Quentin Mulling, FNP-C     6. Anesthesia type (none, local, MAC, general)?  GENERAL   7. What is the office phone and fax number?   Phone: [phone]  Fax: [phone]

## 2022-12-26 NOTE — Patient Instructions (Signed)
Your procedure is scheduled on:01-05-23 Monday Report to the Registration Desk on the 1st floor of the Medical Mall. Then proceed to the 2nd floor Surgery Desk To find out your arrival time, please call [phone] between 1PM - 3PM on:01-02-23 Friday If your arrival time is 6:00 am, do not arrive before that time as the Medical Mall entrance doors do not open until 6:00 am.  REMEMBER: Instructions that are not followed completely may result in serious medical risk, up to and including death; or upon the discretion of your surgeon and anesthesiologist your surgery may need to be rescheduled.  Do not eat food OR drink any liquids after midnight the night before surgery.  No gum chewing or hard candies.  One week prior to surgery:Last dose on 12-28-22 Stop Anti-inflammatories (NSAIDS) such as Advil, Aleve, Ibuprofen, Motrin, Naproxen, Naprosyn and Aspirin based products such as Excedrin, Goody's Powder, BC Powder.You may however, take Tylenol if needed for pain up until the day of surgery. Stop ANY OVER THE COUNTER supplements/vitamins 7 days prior to surgery (Calcium + Vitamin D, Prenatal Vitamin, Vitamin B12, Vitamin C)   Continue taking all prescribed medications  TAKE ONLY THESE MEDICATIONS THE MORNING OF SURGERY WITH A SIP OF WATER: -levothyroxine (SYNTHROID, LEVOTHROID)  -potassium chloride SA (K-DUR,KLOR-CON)  -metoprolol tartrate (LOPRESSOR)  -pantoprazole (PROTONIX)   Continue your 81 mg Aspirin up until the day prior to surgery-Do NOT take the morning of surgery  No Alcohol for 24 hours before or after surgery.  No Smoking including e-cigarettes for 24 hours before surgery.  No chewable tobacco products for at least 6 hours before surgery.  No nicotine patches on the day of surgery.  Do not use any "recreational" drugs for at least a week (preferably 2 weeks) before your surgery.  Please be advised that the combination of cocaine and anesthesia may have negative outcomes, up  to and including death. If you test positive for cocaine, your surgery will be cancelled.  On the morning of surgery brush your teeth with toothpaste and water, you may rinse your mouth with mouthwash if you wish. Do not swallow any toothpaste or mouthwash  Do not wear jewelry, make-up, hairpins, clips or nail polish.  For welded (permanent) jewelry: bracelets, anklets, waist bands, etc.  Please have this removed prior to surgery.  If it is not removed, there is a chance that hospital personnel will need to cut it off on the day of surgery.  Do not wear lotions, powders, or perfumes.   Do not shave body hair from the neck down 48 hours before surgery.  Contact lenses, hearing aids and dentures may not be worn into surgery.  Do not bring valuables to the hospital. [facility] is not responsible for any missing/lost belongings or valuables.   Bring your C-Pap machine to the hospital  Notify your doctor if there is any change in your medical condition (cold, fever, infection).  Wear comfortable clothing (specific to your surgery type) to the hospital.  After surgery, you can help prevent lung complications by doing breathing exercises.  Take deep breaths and cough every 1-2 hours. Your doctor may order a device called an Incentive Spirometer to help you take deep breaths. When coughing or sneezing, hold a pillow firmly against your incision with both hands. This is called "splinting." Doing this helps protect your incision. It also decreases belly discomfort.  If you are being admitted to the hospital overnight, leave your suitcase in the car. After surgery it may  be brought to your room.  In case of increased patient census, it may be necessary for you, the patient, to continue your postoperative care in the Same Day Surgery department.  If you are being discharged the day of surgery, you will not be allowed to drive home. You will need a responsible individual to drive you home and  stay with you for 24 hours after surgery.   If you are taking public transportation, you will need to have a responsible individual with you.  Please call the Pre-admissions Testing Dept. at [phone] if you have any questions about these instructions.  Surgery Visitation Policy:  Patients having surgery or a procedure may have two visitors.  Children under the age of 30 must have an adult with them who is not the patient.

## 2022-12-28 LAB — CULTURE, URINE COMPREHENSIVE

## 2022-12-29 ENCOUNTER — Encounter
Admission: RE | Admit: 2022-12-29 | Discharge: 2022-12-29 | Disposition: A | Discharge status: Home / Self Care | Payer: Medicare PPO | Source: Ambulatory Visit | Attending: Urology

## 2022-12-29 DIAGNOSIS — Z01810 Encounter for preprocedural cardiovascular examination: Secondary | ICD-10-CM | POA: Insufficient documentation

## 2022-12-29 DIAGNOSIS — I4891 Unspecified atrial fibrillation: Secondary | ICD-10-CM | POA: Insufficient documentation

## 2022-12-29 DIAGNOSIS — I447 Left bundle-branch block, unspecified: Secondary | ICD-10-CM | POA: Insufficient documentation

## 2022-12-29 DIAGNOSIS — Z01812 Encounter for preprocedural laboratory examination: Secondary | ICD-10-CM | POA: Diagnosis not present

## 2022-12-29 DIAGNOSIS — I10 Essential (primary) hypertension: Secondary | ICD-10-CM | POA: Diagnosis not present

## 2022-12-29 DIAGNOSIS — R9431 Abnormal electrocardiogram [ECG] [EKG]: Secondary | ICD-10-CM | POA: Diagnosis not present

## 2022-12-29 DIAGNOSIS — Z01818 Encounter for other preprocedural examination: Secondary | ICD-10-CM | POA: Diagnosis present

## 2022-12-29 DIAGNOSIS — Z79899 Other long term (current) drug therapy: Secondary | ICD-10-CM | POA: Insufficient documentation

## 2022-12-29 LAB — BASIC METABOLIC PANEL
Anion gap: 12 (ref 5–15)
BUN: 20 mg/dL (ref 8–23)
CO2: 26 mmol/L (ref 22–32)
Calcium: 9 mg/dL (ref 8.9–10.3)
Chloride: 104 mmol/L (ref 98–111)
Creatinine, Ser: 0.96 mg/dL (ref 0.44–1.00)
GFR, Estimated: 58 mL/min — ABNORMAL LOW (ref >60)
Glucose, Bld: 126 mg/dL — ABNORMAL HIGH (ref 70–99)
Potassium: 3.3 mmol/L — ABNORMAL LOW (ref 3.5–5.1)
Sodium: 142 mmol/L (ref 135–145)

## 2022-12-29 NOTE — Telephone Encounter (Signed)
Name: Alyssa Duncan  DOB: February 06, 1941  MRN: [id number]  Primary Cardiologist: Lorine Bears, MD  Chart reviewed as part of pre-operative protocol coverage. Because of JESS MOTE past medical history and time since last visit, she will require a follow-up in-office visit in order to better assess preoperative cardiovascular risk.  Last OV reviewed 03/2022 - had new worsening dyspnea on exertion, new LBBB. 2D echocardiogram showed low-normal LVEF (50-55%) slightly decreased from previous 55-60%.  Also has at least moderate CAD by cor CT. Given these findings, advanced, age, and need for general anesthesia, in-person visit is best.   Pre-op covering staff: - Please schedule appointment and call patient to inform them. If patient already had an upcoming appointment within acceptable timeframe, please add "pre-op clearance" to the appointment notes so provider is aware. - Please contact requesting surgeon's office via preferred method (i.e, phone, fax) to inform them of need for appointment prior to surgery.  As below, no need to hold ASA.  Laurann Montana, PA-C  12/29/2022, 8:57 AM

## 2022-12-29 NOTE — Telephone Encounter (Signed)
Pt has been scheduled to see Dr. Kirke Corin in the [facility] office, tomorrow, 12/30/22 4:20.  Will route back to the requesting surgeon's office to make them aware.

## 2022-12-30 ENCOUNTER — Encounter: Payer: Self-pay | Admitting: Urology

## 2022-12-30 ENCOUNTER — Ambulatory Visit: Payer: Medicare PPO | Attending: Cardiovascular Disease | Admitting: Cardiovascular Disease

## 2022-12-30 ENCOUNTER — Encounter: Payer: Self-pay | Admitting: Cardiovascular Disease

## 2022-12-30 VITALS — BP 150/62 | HR 70 | Ht 67.0 in | Wt 186.5 lb

## 2022-12-30 DIAGNOSIS — I10 Essential (primary) hypertension: Secondary | ICD-10-CM | POA: Diagnosis not present

## 2022-12-30 DIAGNOSIS — E785 Hyperlipidemia, unspecified: Secondary | ICD-10-CM

## 2022-12-30 DIAGNOSIS — Z01810 Encounter for preprocedural cardiovascular examination: Secondary | ICD-10-CM | POA: Diagnosis not present

## 2022-12-30 DIAGNOSIS — I2510 Atherosclerotic heart disease of native coronary artery without angina pectoris: Secondary | ICD-10-CM

## 2022-12-30 NOTE — Patient Instructions (Signed)
Medication Instructions:  No changes *If you need a refill on your cardiac medications before your next appointment, please call your pharmacy*   Lab Work: None ordered If you have labs (blood work) drawn today and your tests are completely normal, you will receive your results only by: MyChart Message (if you have MyChart) OR A paper copy in the mail If you have any lab test that is abnormal or we need to change your treatment, we will call you to review the results.   Testing/Procedures: None ordereed   Follow-Up: At [facility], you and your health needs are our priority.  As part of our continuing mission to provide you with exceptional heart care, we have created designated Provider Care Teams.  These Care Teams include your primary Cardiologist (physician) and Advanced Practice Providers (APPs -  Physician Assistants and Nurse Practitioners) who all work together to provide you with the care you need, when you need it.  We recommend signing up for the patient portal called "MyChart".  Sign up information is provided on this After Visit Summary.  MyChart is used to connect with patients for Virtual Visits (Telemedicine).  Patients are able to view lab/test results, encounter notes, upcoming appointments, etc.  Non-urgent messages can be sent to your provider as well.   To learn more about what you can do with MyChart, go to [email or web address].    Your next appointment:   6 month(s)  Provider:   You may see Lorine Bears, MD or one of the following Advanced Practice Providers on your designated Care Team:   Nicolasa Ducking, NP Eula Listen, PA-C Cadence Fransico Michael, PA-C Charlsie Quest, NP

## 2022-12-30 NOTE — Progress Notes (Signed)
Cardiology Office Note   Date:  12/30/2022   ID:  Alyssa Duncan, Alyssa Duncan 05/16/1940, MRN [id number]  PCP:  Lynnea Ferrier, MD  Cardiologist:   Lorine Bears, MD   Chief Complaint  Patient presents with   Follow-up    Pre op clearance. Meds reviewed verbally with pt.      History of Present Illness: Alyssa Duncan is a 82 y.o. female who is here today for a follow-up visit regarding coronary artery disease and left bundle branch block.   She had atrial fibrillation in 2017 in the setting of sepsis and bacteremia due to obstructive urethral stones.  She had an echocardiogram done which showed normal LV systolic function, mild mitral and aortic regurgitation and normal atrial size. Pulmonary pressure was normal. She had no recurrent arrhythmia off amiodarone. She was taken off anticoagulation given that atrial fibrillation was in the setting of acute illness.  She had no recurrent atrial fibrillation since then.  Other medical problems include essential hypertension, hyperlipidemia, hypothyroidism and chronic venous insufficiency. Cardiac CTA in 2022 showed evidence of aortic atherosclerosis as well as coronary calcifications with calcium score of 452 with moderate proximal LAD stenosis and mild ostial RCA stenosis.  CT FFR was negative.    She had an echocardiogram done in February 2024 due to left bundle branch block.  It showed an EF of 50 to 55% with mild mitral regurgitation.  She is scheduled for cystoscopy with bladder biopsy next week.  She denies chest pain or worsening dyspnea.  She is able to do her activities of daily living with no significant limitations.   Past Medical History:  Diagnosis Date   Acute biliary pancreatitis    AKI (acute kidney injury) (HCC)    Aortic atherosclerosis (HCC)    Arthritis    Asthma    Back pain    Bladder cancer (HCC)    CAD (coronary artery disease) 02/07/2021   a.) cCTA 02/07/2021: Ca2+ 452 (76th %'ile for age/sex/race match  control); 25% oRCA, 50-69% pLAD; FFRct: LAD = 0.88, LCx = 0.90, RCA = 0.89   Choledocholithiasis    Diastolic dysfunction    a.) TTE 025/10/2017: EF 55-60%, no RWMAs, G1DD, triv AR; b.) TTE 05/15/2022: EF 50-55%, mild LVH, G1DD, mild reduced RVSF, mild LA dil, mild MR/AR   Difficult intubation    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   a.) CNB 03/15/2012 --> pathology (+) for low grade ductal and lobular carcinoma in situ; b.) s/p wide excision 04/05/2012 --> final pathology (+) for 3.6 cm (grade II)  ER/PR (+) DCIS;  b.) s/p XRT (5000 cGY) with 1600 cGY scar boost (completed 06/2012) + 5 years endocrine therapy (exemestane)   Edema leg    Family history of adverse reaction to anesthesia    a.) delayed emergence in 1st degree relative (Father)   Gallstone pancreatitis 05/13/2017   GERD (gastroesophageal reflux disease)    Hyperlipidemia    Hypertension    Hypothyroidism    Kidney stones 12/2015   LBBB (left bundle branch block)    Lesion of bladder 12/2022   Long-term use of aspirin therapy    Obesity    OSA on CPAP    PAF (paroxysmal atrial fibrillation) (HCC) 2017   a.) single episode in setting of urosepis related to nephrolithiasis; no recurrence.   Personal history of radiation therapy 2013   BREAST CA   Sepsis (HCC)    Shingles  Skin cancer 2005   left arm and nose    Past Surgical History:  Procedure Laterality Date   BREAST BIOPSY Left 2013   POS   BREAST BIOPSY Left 2018   benign fat necrosis   BREAST BIOPSY Left 04/24/2022   US biopsy/heart clip/ path pending   BREAST BIOPSY Left 04/24/2022   [place] LT BREAST BX W LOC DEV 1ST LESION IMG BX SPEC US GUIDE 04/24/2022 [facility]   BREAST BIOPSY Left 05/01/2022   MM LT BREAST BX W LOC DEV 1ST LESION IMAGE BX SPEC STEREO GUIDE 05/01/2022 [facility]   BREAST LUMPECTOMY Left 2013   with rad tx   CARPAL TUNNEL RELEASE Right    CATARACT EXTRACTION Bilateral    CHOLECYSTECTOMY N/A 05/15/2017    Procedure: LAPAROSCOPIC CHOLECYSTECTOMY WITH INTRAOPERATIVE CHOLANGIOGRAM;  Surgeon: Ancil Linsey, MD;  Location: [facility];  Service: General;  Laterality: N/A;   COLONOSCOPY WITH PROPOFOL N/A 05/10/2015   Procedure: COLONOSCOPY WITH PROPOFOL;  Surgeon: Wallace Cullens, MD;  Location: [facility];  Service: Gastroenterology;  Laterality: N/A;   CYSTOSCOPY W/ RETROGRADES Bilateral 01/02/2016   Procedure: CYSTOSCOPY WITH RETROGRADE PYELOGRAM;  Surgeon: Vanna Scotland, MD;  Location: [facility];  Service: Urology;  Laterality: Bilateral;   CYSTOSCOPY W/ RETROGRADES Bilateral 11/04/2017   Procedure: CYSTOSCOPY WITH RETROGRADE PYELOGRAM;  Surgeon: Vanna Scotland, MD;  Location: [facility];  Service: Urology;  Laterality: Bilateral;   CYSTOSCOPY W/ URETERAL STENT PLACEMENT Right 01/02/2016   Procedure: CYSTOSCOPY WITH STENT REPLACEMENT;  Surgeon: Vanna Scotland, MD;  Location: [facility];  Service: Urology;  Laterality: Right;   CYSTOSCOPY WITH STENT PLACEMENT Right 12/04/2015   Procedure: CYSTOSCOPY WITH STENT PLACEMENT;  Surgeon: Vanna Scotland, MD;  Location: [facility];  Service: Urology;  Laterality: Right;   ENDOSCOPIC RETROGRADE CHOLANGIOPANCREATOGRAPHY (ERCP) WITH PROPOFOL N/A 05/14/2017   Procedure: ENDOSCOPIC RETROGRADE CHOLANGIOPANCREATOGRAPHY (ERCP) WITH PROPOFOL;  Surgeon: Midge Minium, MD;  Location: [facility];  Service: Endoscopy;  Laterality: N/A;   JOINT REPLACEMENT Left 2013   hip   KNEE ARTHROSCOPY Right    SEPTOPLASTY     TONSILLECTOMY     age 49   TRANSURETHRAL RESECTION OF BLADDER TUMOR WITH MITOMYCIN-C N/A 01/02/2016   Procedure: TRANSURETHRAL RESECTION OF BLADDER TUMOR WITH MITOMYCIN-C;  Surgeon: Vanna Scotland, MD;  Location: [facility];  Service: Urology;  Laterality: N/A;   URETEROSCOPY WITH HOLMIUM LASER LITHOTRIPSY Right 01/02/2016   Procedure: URETEROSCOPY WITH HOLMIUM LASER LITHOTRIPSY;  Surgeon: Vanna Scotland, MD;  Location: [facility];  Service: Urology;  Laterality:  Right;   UVULOPALATOPHARYNGOPLASTY     and tongue surgery     Current Outpatient Medications  Medication Sig Dispense Refill   acetaminophen (TYLENOL) 500 MG tablet Take 1,000 mg by mouth 2 (two) times daily.     amLODipine (NORVASC) 10 MG tablet Take 10 mg by mouth at bedtime.     aspirin EC 81 MG tablet Take 1 tablet (81 mg total) by mouth daily. 90 tablet 3   Calcium Carb-Cholecalciferol 600-800 MG-UNIT TABS Take 1 tablet by mouth 2 (two) times daily.     docusate sodium (COLACE) 50 MG capsule Take 50 mg by mouth daily as needed.     dorzolamide-timolol (COSOPT) 22.3-6.8 MG/ML ophthalmic solution Place 1 drop into both eyes 2 (two) times daily.      furosemide (LASIX) 20 MG tablet Take 20 mg by mouth as needed.     hydrochlorothiazide (HYDRODIURIL) 25 MG tablet Take 1 tablet by mouth every morning.  ibuprofen (ADVIL,MOTRIN) 200 MG tablet Take 400-600 mg by mouth every 6 (six) hours as needed for headache or moderate pain.      levothyroxine (SYNTHROID, LEVOTHROID) 75 MCG tablet Take 75 mcg by mouth daily before breakfast.     loratadine (CLARITIN) 10 MG tablet Take 10 mg by mouth at bedtime.     losartan (COZAAR) 100 MG tablet Take 100 mg by mouth daily before lunch.     lovastatin (MEVACOR) 40 MG tablet Take 40 mg by mouth at bedtime.     LUMIGAN 0.01 % SOLN Place 1 drop into both eyes at bedtime.      metoprolol tartrate (LOPRESSOR) 100 MG tablet Take 100 mg by mouth 2 (two) times daily.     pantoprazole (PROTONIX) 40 MG tablet Take 40 mg by mouth at bedtime.      potassium chloride SA (K-DUR,KLOR-CON) 20 MEQ tablet Take 20 mEq by mouth 2 (two) times daily.     Prenatal Vit-Fe Fumarate-FA (PRENATAL MULTIVITAMIN) TABS tablet Take 1 tablet by mouth daily at 12 noon.     vitamin B-12 (CYANOCOBALAMIN) 1000 MCG tablet Take 1,000 mcg by mouth daily.     vitamin C (ASCORBIC ACID) 500 MG tablet Take 500 mg by mouth daily.     No current facility-administered medications for this visit.     Allergies:   Ativan [lorazepam], Cardura [doxazosin], Clonidine derivatives, Enalapril, Levaquin [levofloxacin], Mobic [meloxicam], Sulfa antibiotics, and Vicodin [hydrocodone-acetaminophen]    Social History:  The patient  reports that she has never smoked. She has never used smokeless tobacco. She reports that she does not drink alcohol and does not use drugs.   Family History:  The patient's family history includes Bladder Cancer in her father; Breast cancer (age of onset: 4) in her daughter; Breast cancer (age of onset: 21) in her mother; Esophageal cancer (age of onset: 97) in her sister; Heart attack in her father; Heart disease in her daughter, father, and son; Kidney Stones in her brother; Melanoma in her daughter; Prostate cancer in her brother; Skin cancer in her brother, daughter, daughter, and sister.    ROS:  Please see the history of present illness.   Otherwise, review of systems are positive for none.   All other systems are reviewed and negative.    PHYSICAL EXAM: VS:  BP (!) 150/62 (BP Location: Left Arm, Patient Position: Sitting, Cuff Size: Normal)   Pulse 70   Ht 5\' 7"  (1.702 m)   Wt 186 lb 8 oz (84.6 kg)   SpO2 97%   BMI 29.21 kg/m  , BMI Body mass index is 29.21 kg/m. GEN: Well nourished, well developed, in no acute distress  HEENT: normal  Neck: no JVD, carotid bruits, or masses Cardiac: RRR; no murmurs, rubs, or gallops,no edema  Respiratory:  clear to auscultation bilaterally, normal work of breathing GI: soft, nontender, nondistended, + BS MS: no deformity or atrophy  Skin: warm and dry, no rash Neuro:  Strength and sensation are intact Psych: euthymic mood, full affect   EKG:  EKG is ordered today. The ekg ordered today demonstrates normal sinus rhythm with left bundle branch block.  Recent Labs: 12/29/2022: BUN 20; Creatinine, Ser 0.96; Potassium 3.3; Sodium 142    Lipid Panel No results found for: "CHOL", "TRIG", "HDL", "CHOLHDL", "VLDL",  "LDLCALC", "LDLDIRECT"    Wt Readings from Last 3 Encounters:  12/30/22 186 lb 8 oz (84.6 kg)  12/26/22 189 lb 2.5 oz (85.8 kg)  11/25/22 189 lb 4  oz (85.8 kg)           No data to display            ASSESSMENT AND PLAN:  1.  Coronary artery disease involving native coronary arteries without angina: The patient is known to have mild to moderate nonobstructive coronary artery disease.  Currently with no anginal symptoms.  Continue low-dose aspirin and treatment of risk factors.  Aspirin can be held for upcoming cystoscopy and bladder biopsy.  2. Paroxysmal atrial fibrillation: The patient had one episode of atrial fibrillation in the setting of sepsis and respiratory distress  In 2017.  No evidence of recurrent arrhythmia since then.  No need for long-term anticoagulation unless she develops recurrent atrial fibrillation.  3.  Essential hypertension: She has known whitecoat syndrome.  Blood pressure is reasonably controlled on current medications.  4.  Hyperlipidemia: Most recent lipid profile in July showed an LDL of 65.  Continue treatment with lovastatin.  5.  Left bundle branch block: This is stable.  Echocardiogram this year showed an EF of 50 to 55%.  6.  Preop cardiovascular evaluation for cystoscopy and bladder biopsy: Patient has no anginal symptoms and her functional capacity is greater than 4 METS.  She does not require further ischemic cardiac evaluation and can proceed at an overall low risk.   Disposition:   FU in 6 months.  Signed,  Lorine Bears, MD  12/30/2022 4:13 PM    [facility]

## 2022-12-31 ENCOUNTER — Encounter: Payer: Self-pay | Admitting: Urology

## 2022-12-31 ENCOUNTER — Ambulatory Visit: Payer: Medicare PPO | Admitting: Physician Assistant

## 2022-12-31 NOTE — Progress Notes (Signed)
Perioperative / Anesthesia Services  Pre-Admission Testing Clinical Review / Pre-Operative Anesthesia Consult  Date: 01/01/23  Patient Demographics:  Name: Alyssa Duncan DOB:   10/27/1940 MRN:   [id number]  Planned Surgical Procedure(s):    Case: [id number] Date/Time: 01/05/23 0730   Procedures:      CYSTOSCOPY WITH BLADDER BIOPSY     CYSTOSCOPY WITH RETROGRADE PYELOGRAM (Bilateral)   Anesthesia type: General   Pre-op diagnosis: Bladder Lesion   Location: [facility] OR ROOM 10 / [facility]   Surgeons: Vanna Scotland, MD     NOTE: Available PAT nursing documentation and vital signs have been reviewed. Clinical nursing staff has updated patient's PMH/PSHx, current medication list, and drug allergies/intolerances to ensure comprehensive history available to assist in medical decision making as it pertains to the aforementioned surgical procedure and anticipated anesthetic course. Extensive review of available clinical information personally performed. Bracey PMH and PSHx updated with any diagnoses/procedures that  may have been inadvertently omitted during her intake with the pre-admission testing department's nursing staff.  Clinical Discussion:  Alyssa Duncan is a 82 y.o. female who is submitted for pre-surgical anesthesia review and clearance prior to her undergoing the above procedure. Patient has never been a smoker. 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  Patient is followed by cardiology Kirke Corin, MD). She was last seen in the cardiology clinic on 12/30/2022; notes reviewed. At the time of her clinic visit, patient doing well overall from a cardiovascular perspective. Patient denied any chest pain,  shortness of breath, PND, orthopnea, palpitations, significant peripheral edema, weakness, fatigue, vertiginous symptoms, or presyncope/syncope. Patient with a past medical history significant for cardiovascular diagnoses. Documented physical exam was grossly benign, providing no evidence of acute exacerbation and/or decompensation of the patient's known cardiovascular conditions.  Coronary CTA was performed on 02/07/2021 that demonstrated an Agatston coronary artery calcium score of 452. This placed patient in the 76th percentile for age, sex, and race matched controls. Calcium depositions noted to be isolated mainly in the LAD (moderate) and ostial RCA (mild) distributions.  Study demonstrates normal coronary origin with RIGHT dominance.  Study sent for [facility] analysis as follows (ranges: < 0.75 high likelihood of hemodynamically significant stenosis, 0.76-0.80 borderline, > 0.80 normal):   LM = no significant stenosis LAD = 0.88 in the mid LAD (non significant) LCx = 0.90 (non significant) RCA = 0.89 in the distal RCA (non significant)  Most recent TTE was performed on 05/15/2022 revealing a low normal left ventricular systolic function with an EF of 50-55%.  There were no regional wall motion abnormalities.  Mild LVH noted. Left ventricular diastolic Doppler parameters consistent with abnormal relaxation (G1DD).  GLS -16.9%.  Right ventricular size and was normal with low normal systolic function.  Left atrium mildly dilated.  There was mild mitral and aortic valve regurgitation. All transvalvular gradients were noted to be normal providing no evidence suggestive of valvular stenosis. Aorta normal in size with no evidence of aneurysmal dilatation.  Patient experienced a single episode of paroxysmal atrial fibrillation back in 2017 in the setting of urosepsis related to nephrolithiasis.  Since that time, patient has had no recurrence.  Blood pressure elevated at 150/62 mmHg on currently prescribed CCB  (amlodipine), diuretic (furosemide + HCTZ), ARB (losartan), and beta-blocker (metoprolol tartrate) therapies.  Patient is on lovastatin for her HLD diagnosis and further ASCVD prevention.  She is not diabetic.  Patient does have an OSAH diagnosis and is compliant with prescribed nocturnal PAP therapy.  Functional capacity somewhat limited by her age and associated arthritides.  With that said, patient is able to complete all of her ADLs/IADLs independently without cardiovascular limitation.  Per the DASI, patient is able to achieve >4 METS of physical activity without experiencing any significant degrees of angina/anginal equivalent symptoms.  No changes were made to her medication regimen.  Patient to follow-up with outpatient cardiology in 6 months or sooner if needed.  Alyssa Duncan is scheduled for an CYSTOSCOPY WITH BLADDER BIOPSY; CYSTOSCOPY WITH RETROGRADE PYELOGRAM (Bilateral) on 01/05/2023 with Dr. Bishop Dublin, MD.  Given patient's past medical history significant for cardiovascular diagnoses, presurgical cardiac clearance was sought by the PAT team.  Per cardiology, "preop cardiovascular evaluation for cystoscopy and bladder biopsy performed. Patient has no anginal symptoms and her functional capacity is greater than 4 METS. She does not require further ischemic cardiac evaluation and can proceed at an overall LOW risk".  In review of her medication reconciliation, it is noted that patient is currently on prescribed daily antithrombotic therapy.  Given patient's cardiovascular history, Dr. Apolinar Junes has approved patient to continue her daily low-dose ASA throughout her perioperative course.  Patient has been instructed on recommendations from her surgeon regarding her aspirin.  Patient reports previous perioperative complications with anesthesia in the past. Patient was determined to have a (+) difficult airway for procedure back in 05/2017 due to macroglossia, reduced cervical mobility, and an  anterior larynx.  Additionally, patient with family history of (+) delayed emergence in a first-degree relative (father). In review of the available records, it is noted that patient underwent a general anesthetic course here at [facility] in 10/2017 without documented complications.      12/30/2022    4:07 PM 12/26/2022    9:00 AM 11/25/2022    2:16 PM  Vitals with BMI  Height 5\' 7"  5\' 1"  5\' 1"   Weight 186 lbs 8 oz 189 lbs 2 oz 189 lbs 4 oz  BMI 29.2 35.76 35.78  Systolic 150  147  Diastolic 62  78  Pulse 70  63    Providers/Specialists:   NOTE: Primary physician provider listed below. Patient may have been seen by APP or partner within same practice.   PROVIDER ROLE / SPECIALTY LAST Jaquelyn Bitter, MD Urology (Surgeon) 11/25/2022  Lynnea Ferrier, MD Primary Care Provider 10/29/2022  Lorine Bears, MD Cardiology 12/30/2022  Rickard Patience, MD Medical Oncology 03/18/2022  Ned Clines, MD Pulmonary Medicine 01/15/2022   Allergies:  Ativan [lorazepam], Cardura [doxazosin], Clonidine derivatives, Enalapril, Levaquin [levofloxacin], Mobic [meloxicam], Sulfa antibiotics, and Vicodin [hydrocodone-acetaminophen]  Current Home Medications:   No current facility-administered medications for this encounter.    acetaminophen (TYLENOL) 500 MG tablet   amLODipine (NORVASC) 10 MG tablet   aspirin EC 81 MG tablet   Calcium Carb-Cholecalciferol 600-800 MG-UNIT TABS   docusate sodium (COLACE) 50 MG capsule   dorzolamide-timolol (COSOPT) 22.3-6.8 MG/ML ophthalmic solution   furosemide (LASIX) 20 MG tablet   hydrochlorothiazide (HYDRODIURIL) 25 MG tablet   ibuprofen (ADVIL,MOTRIN) 200 MG tablet   levothyroxine (SYNTHROID, LEVOTHROID) 75 MCG tablet   loratadine (CLARITIN) 10 MG tablet   losartan (COZAAR) 100 MG tablet   lovastatin (MEVACOR) 40 MG tablet   LUMIGAN 0.01 % SOLN   metoprolol  tartrate (LOPRESSOR) 100 MG tablet   pantoprazole  (PROTONIX) 40 MG tablet   potassium chloride SA (K-DUR,KLOR-CON) 20 MEQ tablet   Prenatal Vit-Fe Fumarate-FA (PRENATAL MULTIVITAMIN) TABS tablet   vitamin B-12 (CYANOCOBALAMIN) 1000 MCG tablet   vitamin C (ASCORBIC ACID) 500 MG tablet   History:   Past Medical History:  Diagnosis Date   Acute biliary pancreatitis    Aortic atherosclerosis (HCC)    Arthritis    Asthma    Back pain    Bladder cancer (HCC)    CAD (coronary artery disease) 02/07/2021   a.) cCTA 02/07/2021: Ca2+ 452 (76th %'ile for age/sex/race match control); 25% oRCA, 50-69% pLAD; FFRct: LAD = 0.88, LCx = 0.90, RCA = 0.89   Choledocholithiasis    Diastolic dysfunction    a.) TTE 025/10/2017: EF 55-60%, no RWMAs, G1DD, triv AR; b.) TTE 05/15/2022: EF 50-55%, mild LVH, G1DD, mild reduced RVSF, mild LA dil, mild MR/AR   Difficult intubation 05/2017   a.) macroglossia; b.) reduced cervical mobility; c.) anterior larynx   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   a.) CNB 03/15/2012 --> pathology (+) for low grade ductal and lobular carcinoma in situ; b.) s/p wide excision 04/05/2012 --> final pathology (+) for 3.6 cm (grade II)  ER/PR (+) DCIS;  b.) s/p XRT (5000 cGY) with 1600 cGY scar boost (completed 06/2012) + 5 years endocrine therapy (exemestane)   Edema leg    Family history of adverse reaction to anesthesia    a.) delayed emergence in 1st degree relative (Father)   Gallstone pancreatitis 05/13/2017   GERD (gastroesophageal reflux disease)    Hyperlipidemia    Hypertension    Hypothyroidism    Kidney stones 12/2015   LBBB (left bundle branch block)    LEFT adrenal nodule (HCC)    Lesion of bladder 12/2022   Long-term use of aspirin therapy    Obesity    OSA on CPAP    PAF (paroxysmal atrial fibrillation) (HCC) 2017   a.) single episode in setting of urosepis related to nephrolithiasis; no recurrence.   Personal history of radiation therapy 2013   BREAST CA   Sepsis (HCC)    Shingles     Skin cancer 2005   left arm and nose   Past Surgical History:  Procedure Laterality Date   BREAST BIOPSY Left 2013   POS   BREAST BIOPSY Left 2018   benign fat necrosis   BREAST BIOPSY Left 04/24/2022   US biopsy/heart clip/benign mammary parenchyma with dense stromal fibrosis and fibroadenomatoid changes; focal chages sugguestive of remote fat necrosis; no atypia or malignancy   BREAST BIOPSY Left 04/24/2022   [place] LT BREAST BX W LOC DEV 1ST LESION IMG BX SPEC US GUIDE 04/24/2022 [facility]   BREAST BIOPSY Left 05/01/2022   MM LT BREAST BX W LOC DEV 1ST LESION IMAGE BX SPEC STEREO GUIDE 05/01/2022 [facility]   BREAST LUMPECTOMY Left 2013   CARPAL TUNNEL RELEASE Right    CATARACT EXTRACTION Bilateral    CHOLECYSTECTOMY N/A 05/15/2017   Procedure: LAPAROSCOPIC CHOLECYSTECTOMY WITH INTRAOPERATIVE CHOLANGIOGRAM;  Surgeon: Ancil Linsey, MD;  Location: [facility];  Service: General;  Laterality: N/A;   COLONOSCOPY WITH PROPOFOL N/A 05/10/2015   Procedure: COLONOSCOPY WITH PROPOFOL;  Surgeon: Wallace Cullens, MD;  Location: [facility];  Service: Gastroenterology;  Laterality: N/A;   CYSTOSCOPY W/ RETROGRADES Bilateral 01/02/2016   Procedure: CYSTOSCOPY WITH RETROGRADE PYELOGRAM;  Surgeon: Vanna Scotland, MD;  Location: [facility];  Service:  Urology;  Laterality: Bilateral;   CYSTOSCOPY W/ RETROGRADES Bilateral 11/04/2017   Procedure: CYSTOSCOPY WITH RETROGRADE PYELOGRAM;  Surgeon: Vanna Scotland, MD;  Location: [facility];  Service: Urology;  Laterality: Bilateral;   CYSTOSCOPY W/ URETERAL STENT PLACEMENT Right 01/02/2016   Procedure: CYSTOSCOPY WITH STENT REPLACEMENT;  Surgeon: Vanna Scotland, MD;  Location: [facility];  Service: Urology;  Laterality: Right;   CYSTOSCOPY WITH STENT PLACEMENT Right 12/04/2015   Procedure: CYSTOSCOPY WITH STENT PLACEMENT;  Surgeon: Vanna Scotland, MD;  Location: [facility];  Service: Urology;  Laterality: Right;   ENDOSCOPIC RETROGRADE  CHOLANGIOPANCREATOGRAPHY (ERCP) WITH PROPOFOL N/A 05/14/2017   Procedure: ENDOSCOPIC RETROGRADE CHOLANGIOPANCREATOGRAPHY (ERCP) WITH PROPOFOL;  Surgeon: Midge Minium, MD;  Location: [facility];  Service: Endoscopy;  Laterality: N/A;   KNEE ARTHROSCOPY Right    SEPTOPLASTY     TONSILLECTOMY     age 56   TOTAL HIP ARTHROPLASTY Left 2013   TRANSURETHRAL RESECTION OF BLADDER TUMOR WITH MITOMYCIN-C N/A 01/02/2016   Procedure: TRANSURETHRAL RESECTION OF BLADDER TUMOR WITH MITOMYCIN-C;  Surgeon: Vanna Scotland, MD;  Location: [facility];  Service: Urology;  Laterality: N/A;   URETEROSCOPY WITH HOLMIUM LASER LITHOTRIPSY Right 01/02/2016   Procedure: URETEROSCOPY WITH HOLMIUM LASER LITHOTRIPSY;  Surgeon: Vanna Scotland, MD;  Location: [facility];  Service: Urology;  Laterality: Right;   UVULOPALATOPHARYNGOPLASTY     and tongue surgery   Family History  Problem Relation Age of Onset   Breast cancer Mother 48   Heart disease Father    Heart attack Father    Bladder Cancer Father    Esophageal cancer Sister 16   Skin cancer Sister    Kidney Stones Brother    Skin cancer Brother        worked on farm   Prostate cancer Brother        dx late 35s   Skin cancer Daughter    Breast cancer Daughter 74   Skin cancer Daughter    Melanoma Daughter    Heart disease Daughter    Heart disease Son    Social History   Tobacco Use   Smoking status: Never   Smokeless tobacco: Never  Vaping Use   Vaping status: Never Used  Substance Use Topics   Alcohol use: No   Drug use: No    Pertinent Clinical Results:  LABS:   No visits with results within 3 Day(s) from this visit.  Latest known visit with results is:  Hospital Outpatient Visit on 12/29/2022  Component Date Value Ref Range Status   Sodium 12/29/2022 142  135 - 145 mmol/L Final   Potassium 12/29/2022 3.3 (L)  3.5 - 5.1 mmol/L Final   Chloride 12/29/2022 104  98 - 111 mmol/L Final   CO2 12/29/2022 26  22 - 32 mmol/L Final   Glucose, Bld  12/29/2022 126 (H)  70 - 99 mg/dL Final   Glucose reference range applies only to samples taken after fasting for at least 8 hours.   BUN 12/29/2022 20  8 - 23 mg/dL Final   Creatinine, Ser 12/29/2022 0.96  0.44 - 1.00 mg/dL Final   Calcium 45/40/9811 9.0  8.9 - 10.3 mg/dL Final   GFR, Estimated 12/29/2022 58 (L)  >60 mL/min Final   Comment: (NOTE) Calculated using the CKD-EPI Creatinine Equation (2021)    Anion gap 12/29/2022 12  5 - 15 Final   Performed at [facility], [address] [place], [place] [address]   Component Ref Range & Units 10/22/2022  WBC (  White Blood Cell Count) 4.1 - 10.2 10^3/uL 9.3  RBC (Red Blood Cell Count) 4.04 - 5.48 10^6/uL 4.84  Hemoglobin 12.0 - 15.0 gm/dL 69.6 High   Hematocrit 35.0 - 47.0 % 45.8  MCV (Mean Corpuscular Volume) 80.0 - 100.0 fl 94.6  MCH (Mean Corpuscular Hemoglobin) 27.0 - 31.2 pg 31.8 High   MCHC (Mean Corpuscular Hemoglobin Concentration) 32.0 - 36.0 gm/dL 29.5  Platelet Count 284 - 450 10^3/uL 286  RDW-CV (Red Cell Distribution Width) 11.6 - 14.8 % 13.1  MPV (Mean Platelet Volume) 9.4 - 12.4 fl 9.9  Neutrophils 1.50 - 7.80 10^3/uL 6.31  Lymphocytes 1.00 - 3.60 10^3/uL 1.89  Monocytes 0.00 - 1.50 10^3/uL 0.63  Eosinophils 0.00 - 0.55 10^3/uL 0.37  Basophils 0.00 - 0.09 10^3/uL 0.04  Neutrophil % 32.0 - 70.0 % 68.2  Lymphocyte % 10.0 - 50.0 % 20.4  Monocyte % 4.0 - 13.0 % 6.8  Eosinophil % 1.0 - 5.0 % 4.0  Basophil% 0.0 - 2.0 % 0.4  Immature Granulocyte % <=0.7 % 0.2  Immature Granulocyte Count <=0.06 10^3/L 0.02  Resulting Agency [facility]  Specimen Collected: 10/22/22 08:13   Performed by: [facility] Last Resulted: 10/22/22 09:30  Received From: [facility]  Result Received: 11/20/22 10:07     ECG: Date: 12/30/2022 Time ECG obtained: 1612 PM Rate: 70 bpm Rhythm: normal sinus;LBBB Axis (leads I and aVF): Left axis deviation Intervals:  PR 204 ms. QRS 138 ms. QTc 488 ms. ST segment and T wave changes: No evidence of acute ST segment elevation or depression.   Comparison: Similar to previous tracing obtained on 12/29/2022   IMAGING / PROCEDURES: TRANSTHORACIC ECHOCARDIOGRAM performed on 05/15/2022 Left ventricular ejection fraction, by estimation, is 50 to 55%. Left  ventricular ejection fraction by 2D MOD biplane is 51.0 %. The left  ventricle has low normal function. The left ventricle has no regional wall motion abnormalities. There is mild  left ventricular hypertrophy. Left ventricular diastolic parameters are  consistent with Grade I diastolic dysfunction (impaired relaxation). The  average left ventricular global longitudinal strain is -16.9 %.  Right ventricular systolic function is low normal. The right ventricular size is normal. Tricuspid regurgitation signal is inadequate for assessing PA pressure.  Left atrial size was mildly dilated.  The mitral valve is normal in structure. Mild mitral valve regurgitation. No evidence of mitral stenosis.  The aortic valve has an indeterminant number of cusps. Aortic valve regurgitation is mild. No aortic stenosis is present.  The inferior vena cava is normal in size with greater than 50% respiratory variability, suggesting right atrial pressure of 3 mmHg.   CT CHEST WO CONTRAST performed on 04/08/2022 Stable right upper lobe pulmonary micronodule. No new pulmonary nodule. Stable chronic 1.6 x 1.2 cm LEFT adrenal nodule No acute intrathoracic abnormality.   CT CORONARY MORPH W/CTA COR W/SCORE W/CA W/CM &/OR WO/CM W/ FRACTIONAL FLOW RESERVE DATA PREP performed on 02/07/2021 5 mm juxtapleural nodule in the RIGHT middle lobe. In a patient with history of breast cancer consider three-month follow-up. Pneumobilia in a patient post sphincterotomy partially imaged. Aortic atherosclerosis Coronary calcium score of 452. This was 76th percentile for age and sex matched control. Normal  coronary origin with right dominance. Calcified plaque causing moderate proximal LAD disease. Calcified plaque causing mild ostial RCA disease. CAD-RADS 3. Moderate stenosis. Consider symptom-guided anti-ischemic pharmacotherapy as well as risk factor modification per guideline directed care. CT FFR analysis (ranges: < 0.75 high  likelihood of hemodynamically significant stenosis, 0.76-0.80 borderline, > 0.80 normal) Left Main:  No significant stenosis. LAD: No significant stenosis.  FFRct 0.88 in the mid LAD LCX: No significant stenosis.  FFRct 0.9 RCA: No significant stenosis.  FFRct 0.89 in the distal RCA.  Impression and Plan:  Alyssa Duncan has been referred for pre-anesthesia review and clearance prior to her undergoing the planned anesthetic and procedural courses. Available labs, pertinent testing, and imaging results were personally reviewed by me in preparation for upcoming operative/procedural course. [facility] medical record has been updated following extensive record review and patient interview with PAT staff.   This patient has been appropriately cleared by cardiology with an overall LOW risk of experiencing significant perioperative cardiovascular complications. Based on clinical review performed today (01/01/23), barring any significant acute changes in the patient's overall condition, it is anticipated that she will be able to proceed with the planned surgical intervention. Any acute changes in clinical condition may necessitate her procedure being postponed and/or cancelled. Patient will meet with anesthesia team (MD and/or CRNA) on the day of her procedure for preoperative evaluation/assessment. Questions regarding anesthetic course will be fielded at that time.   Pre-surgical instructions were reviewed with the patient during her PAT appointment, and questions were fielded to satisfaction by PAT clinical staff. She has been instructed on which medications that she will need to  hold prior to surgery, as well as the ones that have been deemed safe/appropriate to take on the day of her procedure. As part of the general education provided by PAT, patient made aware both verbally and in writing, that she would need to abstain from the use of any illegal substances during her perioperative course.  She was advised that failure to follow the provided instructions could necessitate case cancellation or result in serious perioperative complications up to and including death. Patient encouraged to contact PAT and/or her surgeon's office to discuss any questions or concerns that may arise prior to surgery; verbalized understanding.   Quentin Mulling, MSN, APRN, FNP-C, CEN [facility]  Perioperative Services Nurse Practitioner Phone: [phone] Fax: [phone] 01/01/23 1:24 PM  NOTE: This note has been prepared using Dragon dictation software. Despite my best ability to proofread, there is always the potential that unintentional transcriptional errors may still occur from this process.

## 2023-01-04 MED ORDER — CEFAZOLIN SODIUM-DEXTROSE 2-4 GM/100ML-% IV SOLN
2.0000 g | INTRAVENOUS | Status: AC
  Administered 2023-01-05: 2 g via INTRAVENOUS

## 2023-01-04 MED ORDER — CHLORHEXIDINE GLUCONATE 0.12 % MT SOLN
15.0000 mL | Freq: Once | OROMUCOSAL | Status: AC
  Administered 2023-01-05: 15 mL via OROMUCOSAL

## 2023-01-04 MED ORDER — ORAL CARE MOUTH RINSE: 15.0000 mL | Freq: Once | OROMUCOSAL | Status: AC

## 2023-01-04 MED ORDER — LACTATED RINGERS IV SOLN: INTRAVENOUS | Status: DC

## 2023-01-05 ENCOUNTER — Ambulatory Visit: Payer: Medicare PPO

## 2023-01-05 ENCOUNTER — Encounter: Payer: Self-pay | Admitting: Urology

## 2023-01-05 ENCOUNTER — Ambulatory Visit
Admission: RE | Admit: 2023-01-05 | Discharge: 2023-01-05 | Disposition: A | Discharge status: Home / Self Care | Payer: Medicare PPO | Source: Ambulatory Visit | Attending: Urology | Admitting: Urology

## 2023-01-05 ENCOUNTER — Ambulatory Visit: Payer: Medicare PPO | Admitting: Urgent Care

## 2023-01-05 ENCOUNTER — Other Ambulatory Visit: Payer: Self-pay

## 2023-01-05 ENCOUNTER — Encounter
Admission: RE | Disposition: A | Discharge status: Home / Self Care | Payer: Self-pay | Source: Ambulatory Visit | Attending: Urology

## 2023-01-05 DIAGNOSIS — Z87442 Personal history of urinary calculi: Secondary | ICD-10-CM | POA: Diagnosis not present

## 2023-01-05 DIAGNOSIS — C676 Malignant neoplasm of ureteric orifice: Secondary | ICD-10-CM | POA: Diagnosis present

## 2023-01-05 DIAGNOSIS — Z85828 Personal history of other malignant neoplasm of skin: Secondary | ICD-10-CM | POA: Diagnosis not present

## 2023-01-05 DIAGNOSIS — K219 Gastro-esophageal reflux disease without esophagitis: Secondary | ICD-10-CM | POA: Diagnosis not present

## 2023-01-05 DIAGNOSIS — G4733 Obstructive sleep apnea (adult) (pediatric): Secondary | ICD-10-CM | POA: Insufficient documentation

## 2023-01-05 DIAGNOSIS — I700 Atherosclerosis of aorta: Secondary | ICD-10-CM | POA: Insufficient documentation

## 2023-01-05 DIAGNOSIS — N329 Bladder disorder, unspecified: Secondary | ICD-10-CM

## 2023-01-05 DIAGNOSIS — C678 Malignant neoplasm of overlapping sites of bladder: Secondary | ICD-10-CM | POA: Diagnosis not present

## 2023-01-05 DIAGNOSIS — I447 Left bundle-branch block, unspecified: Secondary | ICD-10-CM | POA: Insufficient documentation

## 2023-01-05 DIAGNOSIS — E039 Hypothyroidism, unspecified: Secondary | ICD-10-CM | POA: Insufficient documentation

## 2023-01-05 DIAGNOSIS — E669 Obesity, unspecified: Secondary | ICD-10-CM | POA: Insufficient documentation

## 2023-01-05 DIAGNOSIS — Z923 Personal history of irradiation: Secondary | ICD-10-CM | POA: Diagnosis not present

## 2023-01-05 DIAGNOSIS — I2510 Atherosclerotic heart disease of native coronary artery without angina pectoris: Secondary | ICD-10-CM | POA: Insufficient documentation

## 2023-01-05 DIAGNOSIS — I480 Paroxysmal atrial fibrillation: Secondary | ICD-10-CM | POA: Diagnosis not present

## 2023-01-05 DIAGNOSIS — Z6829 Body mass index (BMI) 29.0-29.9, adult: Secondary | ICD-10-CM | POA: Diagnosis not present

## 2023-01-05 DIAGNOSIS — I10 Essential (primary) hypertension: Secondary | ICD-10-CM | POA: Insufficient documentation

## 2023-01-05 DIAGNOSIS — Z853 Personal history of malignant neoplasm of breast: Secondary | ICD-10-CM | POA: Insufficient documentation

## 2023-01-05 DIAGNOSIS — J45909 Unspecified asthma, uncomplicated: Secondary | ICD-10-CM | POA: Insufficient documentation

## 2023-01-05 HISTORY — DX: Other ill-defined heart diseases: I51.89

## 2023-01-05 HISTORY — DX: Other specified disorders of adrenal gland: E27.8

## 2023-01-05 HISTORY — DX: Long term (current) use of aspirin: Z79.82

## 2023-01-05 HISTORY — DX: Disorder of adrenal gland, unspecified: E27.9

## 2023-01-05 HISTORY — PX: CYSTOSCOPY W/ RETROGRADES: SHX1426

## 2023-01-05 HISTORY — PX: CYSTOSCOPY WITH BIOPSY: SHX5122

## 2023-01-05 SURGERY — CYSTOSCOPY, WITH BIOPSY
Anesthesia: General | Site: Bladder

## 2023-01-05 MED ORDER — SUGAMMADEX SODIUM 200 MG/2ML IV SOLN
INTRAVENOUS | Status: DC | PRN
  Administered 2023-01-05: 140 mg via INTRAVENOUS

## 2023-01-05 MED ORDER — IOHEXOL 180 MG/ML  SOLN
INTRAMUSCULAR | Status: DC | PRN
  Administered 2023-01-05: 20 mL

## 2023-01-05 MED ORDER — OXYCODONE HCL 5 MG PO TABS: 5.0000 mg | ORAL_TABLET | Freq: Once | ORAL | Status: DC | PRN

## 2023-01-05 MED ORDER — STERILE WATER FOR IRRIGATION IR SOLN
Status: DC | PRN
Start: 2023-01-05 — End: 2023-01-05
  Administered 2023-01-05: 500 mL

## 2023-01-05 MED ORDER — EPHEDRINE SULFATE (PRESSORS) 50 MG/ML IJ SOLN
INTRAMUSCULAR | Status: DC | PRN
  Administered 2023-01-05: 10 mg via INTRAVENOUS

## 2023-01-05 MED ORDER — CEFAZOLIN SODIUM-DEXTROSE 2-4 GM/100ML-% IV SOLN
INTRAVENOUS | Status: AC
  Filled 2023-01-05: qty 100

## 2023-01-05 MED ORDER — LIDOCAINE HCL (CARDIAC) PF 100 MG/5ML IV SOSY
PREFILLED_SYRINGE | INTRAVENOUS | Status: DC | PRN
  Administered 2023-01-05: 100 mg via INTRAVENOUS

## 2023-01-05 MED ORDER — PROPOFOL 10 MG/ML IV BOLUS
INTRAVENOUS | Status: DC | PRN
Start: 2023-01-05 — End: 2023-01-05
  Administered 2023-01-05: 140 mg via INTRAVENOUS

## 2023-01-05 MED ORDER — CHLORHEXIDINE GLUCONATE 0.12 % MT SOLN
OROMUCOSAL | Status: AC
  Filled 2023-01-05: qty 15

## 2023-01-05 MED ORDER — LIDOCAINE HCL (PF) 2 % IJ SOLN
INTRAMUSCULAR | Status: AC
  Filled 2023-01-05: qty 5

## 2023-01-05 MED ORDER — FENTANYL CITRATE (PF) 100 MCG/2ML IJ SOLN: 25.0000 ug | INTRAMUSCULAR | Status: DC | PRN

## 2023-01-05 MED ORDER — SEVOFLURANE IN SOLN
RESPIRATORY_TRACT | Status: AC
  Filled 2023-01-05: qty 250

## 2023-01-05 MED ORDER — PROPOFOL 10 MG/ML IV BOLUS
INTRAVENOUS | Status: AC
  Filled 2023-01-05: qty 20

## 2023-01-05 MED ORDER — DEXAMETHASONE SODIUM PHOSPHATE 10 MG/ML IJ SOLN
INTRAMUSCULAR | Status: DC | PRN
  Administered 2023-01-05: 10 mg via INTRAVENOUS

## 2023-01-05 MED ORDER — FENTANYL CITRATE (PF) 100 MCG/2ML IJ SOLN
INTRAMUSCULAR | Status: DC | PRN
  Administered 2023-01-05 (×2): 25 ug via INTRAVENOUS

## 2023-01-05 MED ORDER — ACETAMINOPHEN 10 MG/ML IV SOLN: 1000.0000 mg | Freq: Once | INTRAVENOUS | Status: DC | PRN

## 2023-01-05 MED ORDER — STERILE WATER FOR IRRIGATION IR SOLN
Status: DC | PRN
Start: 2023-01-05 — End: 2023-01-05
  Administered 2023-01-05: 1500 mL

## 2023-01-05 MED ORDER — DEXAMETHASONE SODIUM PHOSPHATE 10 MG/ML IJ SOLN
INTRAMUSCULAR | Status: AC
  Filled 2023-01-05: qty 1

## 2023-01-05 MED ORDER — ONDANSETRON HCL 4 MG/2ML IJ SOLN
INTRAMUSCULAR | Status: AC
  Filled 2023-01-05: qty 2

## 2023-01-05 MED ORDER — FENTANYL CITRATE (PF) 100 MCG/2ML IJ SOLN
INTRAMUSCULAR | Status: AC
  Filled 2023-01-05: qty 2

## 2023-01-05 MED ORDER — OXYCODONE HCL 5 MG/5ML PO SOLN: 5.0000 mg | Freq: Once | ORAL | Status: DC | PRN

## 2023-01-05 MED ORDER — PROMETHAZINE HCL 25 MG/ML IJ SOLN: 6.2500 mg | INTRAMUSCULAR | Status: DC | PRN

## 2023-01-05 MED ORDER — GLYCOPYRROLATE 0.2 MG/ML IJ SOLN
INTRAMUSCULAR | Status: DC | PRN
  Administered 2023-01-05: .1 mg via INTRAVENOUS

## 2023-01-05 MED ORDER — DROPERIDOL 2.5 MG/ML IJ SOLN: 0.6250 mg | Freq: Once | INTRAMUSCULAR | Status: DC | PRN

## 2023-01-05 MED ORDER — ONDANSETRON HCL 4 MG/2ML IJ SOLN
INTRAMUSCULAR | Status: DC | PRN
  Administered 2023-01-05: 4 mg via INTRAVENOUS

## 2023-01-05 SURGICAL SUPPLY — 23 items
BAG DRAIN SIEMENS DORNER NS (MISCELLANEOUS) ×2 IMPLANT
BAG DRN NS LF (MISCELLANEOUS) ×2
BRUSH SCRUB EZ 1% IODOPHOR (MISCELLANEOUS) ×2 IMPLANT
BRUSH SCRUB EZ 4% CHG (MISCELLANEOUS) ×2 IMPLANT
CATH URETL OPEN 5X70 (CATHETERS) ×2 IMPLANT
DRAPE UTILITY 15X26 TOWEL STRL (DRAPES) ×2 IMPLANT
DRSG TELFA 3X4 N-ADH STERILE (GAUZE/BANDAGES/DRESSINGS) ×2 IMPLANT
ELECT REM PT RETURN 9FT ADLT (ELECTROSURGICAL) ×2
ELECTRODE REM PT RTRN 9FT ADLT (ELECTROSURGICAL) ×2 IMPLANT
GAUZE 4X4 16PLY ~~LOC~~+RFID DBL (SPONGE) ×4 IMPLANT
GLOVE BIO SURGEON STRL SZ 6.5 (GLOVE) ×2 IMPLANT
GOWN STRL REUS W/ TWL LRG LVL3 (GOWN DISPOSABLE) ×4 IMPLANT
GOWN STRL REUS W/TWL LRG LVL3 (GOWN DISPOSABLE) ×4
GUIDEWIRE STR DUAL SENSOR (WIRE) ×2 IMPLANT
IV NS IRRIG 3000ML ARTHROMATIC (IV SOLUTION) ×2 IMPLANT
KIT TURNOVER CYSTO (KITS) ×2 IMPLANT
NDL SAFETY ECLIP 18X1.5 (MISCELLANEOUS) ×2 IMPLANT
PACK CYSTO AR (MISCELLANEOUS) ×2 IMPLANT
SET CYSTO W/LG BORE CLAMP LF (SET/KITS/TRAYS/PACK) ×2 IMPLANT
SURGILUBE 2OZ TUBE FLIPTOP (MISCELLANEOUS) ×2 IMPLANT
WATER STERILE IRR 1000ML POUR (IV SOLUTION) ×2 IMPLANT
WATER STERILE IRR 3000ML UROMA (IV SOLUTION) ×2 IMPLANT
WATER STERILE IRR 500ML POUR (IV SOLUTION) ×2 IMPLANT

## 2023-01-05 NOTE — Op Note (Signed)
Date of procedure: 01/05/23  Preoperative diagnosis:  History of bladder cancer Bladder lesion  Postoperative diagnosis:  Same as above  Procedure: Bilateral retrograde pyelogram Cystoscopy with bladder biopsy  Surgeon: Vanna Scotland, MD  Anesthesia: General  Complications: None  Intraoperative findings: Significant apical and anterior vaginal prolapse appreciated.  Inflammatory like changes on the right hemitrigone.  Bilateral retrograde with slightly fuller right ureter but otherwise no filling defects or hydroureteronephrosis.  Inflammatory changes biopsied x 3.  Hemostasis excellent.  EBL: Minimal  Specimens: Bladder biopsy  Drains: None  Indication: Alyssa Duncan is a 82 y.o. patient with with a personal history of bladder cancer found to have bladder lesion.  After reviewing the management options for treatment, she elected to proceed with the above surgical procedure(s). We have discussed the potential benefits and risks of the procedure, side effects of the proposed treatment, the likelihood of the patient achieving the goals of the procedure, and any potential problems that might occur during the procedure or recuperation. Informed consent has been obtained.  Description of procedure:  The patient was taken to the operating room and general anesthesia was induced.  The patient was placed in the dorsal lithotomy position, prepped and draped in the usual sterile fashion, and preoperative antibiotics were administered. A preoperative time-out was performed.   Notably, the patient had significant prolapse which was reduced.  21 [place] scope was then advanced per urethra into the bladder.  The bladder was carefully inspected.  There are some inflammatory change which is slightly erythematous adjacent to some scarring of the right hemitrigone adjacent to but not involving the right UO.  The UO itself is slightly laterally displaced.  There were no other tumors masses or lesions  in the bladder.  Attention was turned to the left ureter orifice which was cannulated using a 5 [place] open-ended ureteral catheter.  Gentle retrograde pyelogram on the side showed no hydroureteronephrosis or filling defects.  The collecting system appeared to be delicate.  On the right side, the ureter itself is more dilated but no filling defects or hydronephrosis.  Next, cold cup biopsy forceps were used to biopsy the area in question x 3.  Hemostasis was achieved using Bovie electrocautery.  Area was highly suspicious for inflammatory changes and unlikely to represent malignancy on inspection today.  The bladder was drained.  The patient was cleaned and dried, repositioned in supine position, reversed of anesthesia, taken the PACU in stable condition.  Plan: I will call her with her pathology results.  Will likely continue annual surveillance if these are negative.  Vanna Scotland, M.D.

## 2023-01-05 NOTE — Anesthesia Postprocedure Evaluation (Signed)
Anesthesia Post Note  Patient: Alyssa Duncan  Procedure(s) Performed: CYSTOSCOPY WITH BLADDER BIOPSY (Bladder) CYSTOSCOPY WITH RETROGRADE PYELOGRAM (Bilateral)  Patient location during evaluation: PACU Anesthesia Type: General Level of consciousness: awake and alert Pain management: pain level controlled Vital Signs Assessment: post-procedure vital signs reviewed and stable Respiratory status: spontaneous breathing, nonlabored ventilation and respiratory function stable Cardiovascular status: blood pressure returned to baseline and stable Postop Assessment: no apparent nausea or vomiting Anesthetic complications: no   No notable events documented.   Last Vitals:  Vitals:   01/05/23 0830 01/05/23 0901  BP: (!) 144/68 (!) 150/59  Pulse: 67 65  Resp: 14 17  Temp:  (!) 36.4 C  SpO2: 97% 96%    Last Pain:  Vitals:   01/05/23 0901  TempSrc: Oral  PainSc: 0-No pain                 Foye Deer

## 2023-01-05 NOTE — Anesthesia Procedure Notes (Signed)
Procedure Name: LMA Insertion Date/Time: 01/05/2023 7:43 AM  Performed by: Zaydah Nawabi, [place], CRNAPre-anesthesia Checklist: Patient identified, Patient being monitored, Timeout performed, Emergency Drugs available and Suction available Patient Re-evaluated:Patient Re-evaluated prior to induction Oxygen Delivery Method: Circle system utilized Preoxygenation: Pre-oxygenation with 100% oxygen Induction Type: IV induction Ventilation: Mask ventilation without difficulty LMA: LMA inserted LMA Size: 4.0 Tube type: Oral Number of attempts: 1 Placement Confirmation: positive ETCO2 and breath sounds checked- equal and bilateral Tube secured with: Tape Dental Injury: Teeth and Oropharynx as per pre-operative assessment

## 2023-01-05 NOTE — H&P (Signed)
    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   Patient was admitted on 12/03/2015 with fevers, right flank pain found to have an obstructing right distal 5 mm ureteral stone. She was taken to the operating room for cystoscopy, right ureteral stent placement in the setting of worsening leukocytosis and uncontrolled pain.  Intraoperatively, an incidental bladder tumor was identified.     She returned to the operating room on 01/02/2016 for definitive management of her stone via ureteroscopy, laser lithotripsy followed by TURBT. Surgical pathology was consistent with low-grade noninvasive bladder cancer. 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   Cysto in office on 05/01/2016 showed a very small low-grade appearing recurrence ~2 mm, anterior bladder wall in close proximity to the bladder neck. She underwent cystoscopy, fulguration of this lesion in the office shortly thereafter which was well tolerated.     Patient  had concern for possible early recurrence on 10/2017 with some subtle carpeting around the stellate scar as well as focal area of erythema.  She was taken to the operating room shortly thereafter at which time no pathology was identified.  Bilateral retrogrades were unremarkable.      Unable to void today.  She did give urinalysis at her PCP last month which was negative.  She is asymptomatic today.       NED. A&Ox3.   No respiratory distress   Abd soft, NT, ND Normal external genitalia with patent urethral meatus.  Prolapse reduced just prior to cystoscopy.   Cystoscopy Procedure Note   Patient identification was confirmed, informed consent was obtained, and patient was prepped using Betadine solution.  Lidocaine jelly was administered per urethral  meatus.     Procedure: - Flexible cystoscope introduced, without any difficulty.   - Thorough search of the bladder revealed:    normal urethral meatus    normal urothelium with inflammatory changes around the trigonal ridge.  There is a ridge extending on the right side linear posterior lateral from the trigone which also has inflammatory changes versus early papillary carpeting.    no stones    no ulcers        no urethral polyps       Post-Procedure: - Patient tolerated the procedure well   Assessment/ Plan:   History of bladder cancer /bladder lesion -Cystoscopy today with inflammatory changes along the trigone but a new linear area of inflammatory versus early papillary change extending along the ridge extending from the right trigone.  Unclear whether or not this represents inflammatory versus early malignant change.  We discussed risk and benefits of biopsy in detail.  She is most interested in biopsy.  Will also plan for retrograde pyelogram but abstain from gemcitabine given the this is less likely to represent malignancy.   She will need preop UA/urine culture.     Vanna Scotland, MD

## 2023-01-05 NOTE — Transfer of Care (Signed)
Immediate Anesthesia Transfer of Care Note  Patient: Alyssa Duncan  Procedure(s) Performed: CYSTOSCOPY WITH BLADDER BIOPSY (Bladder) CYSTOSCOPY WITH RETROGRADE PYELOGRAM (Bilateral)  Patient Location: PACU  Anesthesia Type:General  Level of Consciousness: drowsy  Airway & Oxygen Therapy: Patient Spontanous Breathing and Patient connected to face mask oxygen  Post-op Assessment: Report given to RN and Post -op Vital signs reviewed and stable  Post vital signs: Reviewed and stable  Last Vitals:  Vitals Value Taken Time  BP 160/73 01/05/23 0812  Temp 97.5   Pulse 64 01/05/23 0813  Resp 29 01/05/23 0813  SpO2 100 % 01/05/23 0813  Vitals shown include unfiled device data.  Last Pain:  Vitals:   01/05/23 0657  TempSrc: Temporal  PainSc: 0-No pain         Complications: No notable events documented.

## 2023-01-05 NOTE — Anesthesia Preprocedure Evaluation (Addendum)
Anesthesia Evaluation  Patient identified by MRN, date of birth, ID band Patient awake    Reviewed: Allergy & Precautions, H&P , NPO status , Patient's Chart, lab work & pertinent test results  History of Anesthesia Complications (+) DIFFICULT AIRWAY and history of anesthetic complications (2019 a.) macroglossia; b.) reduced cervical mobility; c.) anterior larynx)  Airway Mallampati: III  TM Distance: >3 FB Neck ROM: full    Dental no notable dental hx.    Pulmonary shortness of breath and with exertion, sleep apnea    Pulmonary exam normal        Cardiovascular Exercise Tolerance: Poor hypertension, (-) angina + CAD (cCTA 02/07/2021: Ca2+ 452 (76th %'ile for age/sex/race match control); 25% oRCA, 50-69% pLAD; FFRct: LAD = 0.88, LCx = 0.90, RCA = 0.89) and + DOE  CHF: DD 05/15/2022: EF 50-55%, mild LVH, G1DD, mild reduced RVSF, mild LA dil, mild MR/ARLong-term use of aspirin therapyLEFT adrenal nodule (HCC).Normal cardiovascular exam+ dysrhythmias (LBBB with one episode of pAF noted)      Neuro/Psych negative neurological ROS  negative psych ROS   GI/Hepatic Neg liver ROS,GERD  Controlled and Medicated,,  Endo/Other  Hypothyroidism    Renal/GU negative Renal ROS     Musculoskeletal  (+) Arthritis ,    Abdominal Normal abdominal exam  (+)   Peds  Hematology negative hematology ROS (+)   Anesthesia Other Findings Past Medical History: No date: Acute biliary pancreatitis No date: Aortic atherosclerosis (HCC) No date: Arthritis No date: Asthma No date: Back pain No date: Bladder cancer (HCC) 02/07/2021: CAD (coronary artery disease)     Comment:  a.) cCTA 02/07/2021: Ca2+ 452 (76th %'ile for               age/sex/race match control); 25% oRCA, 50-69% pLAD;               FFRct: LAD = 0.88, LCx = 0.90, RCA = 0.89 No date: Choledocholithiasis No date: Diastolic dysfunction     Comment:  a.) TTE 025/10/2017: EF  55-60%, no RWMAs, G1DD, triv AR;              b.) TTE 05/15/2022: EF 50-55%, mild LVH, G1DD, mild               reduced RVSF, mild LA dil, mild MR/AR 05/2017: Difficult intubation     Comment:  a.) macroglossia; b.) reduced cervical mobility; c.)               anterior larynx 03/15/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     Comment:  a.) CNB 03/15/2012 --> pathology (+) for low grade               ductal and lobular carcinoma in situ; b.) s/p wide               excision 04/05/2012 --> final pathology (+) for 3.6 cm               (grade II)  ER/PR (+) DCIS;  b.) s/p XRT (5000 cGY) with               1600 cGY scar boost (completed 06/2012) + 5 years               endocrine therapy (exemestane) No date: Edema leg No date: Family history of adverse reaction to anesthesia     Comment:  a.) delayed emergence in 1st degree relative (Father) 05/13/2017: Gallstone pancreatitis No date: GERD (gastroesophageal  reflux disease) No date: Hyperlipidemia No date: Hypertension No date: Hypothyroidism 12/2015: Kidney stones No date: LBBB (left bundle branch block) No date: LEFT adrenal nodule (HCC) 12/2022: Lesion of bladder No date: Long-term use of aspirin therapy No date: Obesity No date: OSA on CPAP 2017: PAF (paroxysmal atrial fibrillation) (HCC)     Comment:  a.) single episode in setting of urosepis related to               nephrolithiasis; no recurrence. 2013: Personal history of radiation therapy     Comment:  BREAST CA No date: Sepsis (HCC) No date: Shingles 2005: Skin cancer     Comment:  left arm and nose  Past Surgical History: 2013: BREAST BIOPSY; Left     Comment:  POS 2018: BREAST BIOPSY; Left     Comment:  benign fat necrosis 04/24/2022: BREAST BIOPSY; Left     Comment:  US biopsy/heart clip/benign mammary parenchyma with               dense stromal fibrosis and fibroadenomatoid changes;               focal chages sugguestive of remote fat necrosis; no                atypia or malignancy 04/24/2022: BREAST BIOPSY; Left     Comment:  [place] LT BREAST BX W LOC DEV 1ST LESION IMG BX SPEC [place]               GUIDE 04/24/2022 [facility] 05/01/2022: BREAST BIOPSY; Left     Comment:  MM LT BREAST BX W LOC DEV 1ST LESION IMAGE BX SPEC               STERE[facility] GUIDE 05/01/2022 [facility] 2013: BREAST LUMPECTOMY; Left No date: CARPAL TUNNEL RELEASE; Right No date: CATARACT EXTRACTION; Bilateral 05/15/2017: CHOLECYSTECTOMY; N/A     Comment:  Procedure: LAPAROSCOPIC CHOLECYSTECTOMY WITH               INTRAOPERATIVE CHOLANGIOGRAM;  Surgeon: Ancil Linsey, MD;  Location: [facility];  Service: General;                Laterality: N/A; 05/10/2015: COLONOSCOPY WITH PROPOFOL; N/A     Comment:  Procedure: COLONOSCOPY WITH PROPOFOL;  Surgeon: Wallace Cullens, MD;  Location: [facility];  Service:               Gastroenterology;  Laterality: N/A; 01/02/2016: CYSTOSCOPY W/ RETROGRADES; Bilateral     Comment:  Procedure: CYSTOSCOPY WITH RETROGRADE PYELOGRAM;                Surgeon: Vanna Scotland, MD;  Location: [facility];                Service: Urology;  Laterality: Bilateral; 11/04/2017: CYSTOSCOPY W/ RETROGRADES; Bilateral     Comment:  Procedure: CYSTOSCOPY WITH RETROGRADE PYELOGRAM;                Surgeon: Vanna Scotland, MD;  Location: [facility];                Service: Urology;  Laterality: Bilateral; 01/02/2016: CYSTOSCOPY W/ URETERAL STENT PLACEMENT; Right     Comment:  Procedure: CYSTOSCOPY WITH STENT REPLACEMENT;  Surgeon:  Vanna Scotland, MD;  Location: [facility];  Service:               Urology;  Laterality: Right; 12/04/2015: CYSTOSCOPY WITH STENT PLACEMENT; Right     Comment:  Procedure: CYSTOSCOPY WITH STENT PLACEMENT;  Surgeon:               Vanna Scotland, MD;  Location: [facility];  Service:               Urology;  Laterality: Right; 05/14/2017: ENDOSCOPIC RETROGRADE CHOLANGIOPANCREATOGRAPHY (ERCP)  WITH  PROPOFOL; N/A     Comment:  Procedure: ENDOSCOPIC RETROGRADE               CHOLANGIOPANCREATOGRAPHY (ERCP) WITH PROPOFOL;  Surgeon:               Midge Minium, MD;  Location: [facility];  Service:               Endoscopy;  Laterality: N/A; No date: KNEE ARTHROSCOPY; Right No date: SEPTOPLASTY No date: TONSILLECTOMY     Comment:  age 82 2013: TOTAL HIP ARTHROPLASTY; Left 01/02/2016: TRANSURETHRAL RESECTION OF BLADDER TUMOR WITH MITOMYCIN- C; N/A     Comment:  Procedure: TRANSURETHRAL RESECTION OF BLADDER TUMOR WITH              MITOMYCIN-C;  Surgeon: Vanna Scotland, MD;  Location:               [facility];  Service: Urology;  Laterality: N/A; 01/02/2016: URETEROSCOPY WITH HOLMIUM LASER LITHOTRIPSY; Right     Comment:  Procedure: URETEROSCOPY WITH HOLMIUM LASER LITHOTRIPSY;               Surgeon: Vanna Scotland, MD;  Location: [facility];                Service: Urology;  Laterality: Right; No date: UVULOPALATOPHARYNGOPLASTY     Comment:  and tongue surgery     Reproductive/Obstetrics negative OB ROS                             Anesthesia Physical Anesthesia Plan  ASA: 3  Anesthesia Plan: General ETT   Post-op Pain Management: Ofirmev IV (intra-op)* and Toradol IV (intra-op)*   Induction: Intravenous  PONV Risk Score and Plan: 2 and Ondansetron, Dexamethasone and Midazolam  Airway Management Planned: LMA  Additional Equipment:   Intra-op Plan:   Post-operative Plan: Extubation in OR  Informed Consent: I have reviewed the patients History and Physical, chart, labs and discussed the procedure including the risks, benefits and alternatives for the proposed anesthesia with the patient or authorized representative who has indicated his/her understanding and acceptance.     Dental Advisory Given  Plan Discussed with: CRNA and Surgeon  Anesthesia Plan Comments:         Anesthesia Quick Evaluation

## 2023-01-05 NOTE — Discharge Instructions (Addendum)

## 2023-01-06 LAB — SURGICAL PATHOLOGY

## 2023-02-20 ENCOUNTER — Other Ambulatory Visit: Payer: Self-pay | Admitting: Student

## 2023-02-20 DIAGNOSIS — M7581 Other shoulder lesions, right shoulder: Secondary | ICD-10-CM

## 2023-02-20 DIAGNOSIS — M7521 Bicipital tendinitis, right shoulder: Secondary | ICD-10-CM

## 2023-02-20 DIAGNOSIS — S46011D Strain of muscle(s) and tendon(s) of the rotator cuff of right shoulder, subsequent encounter: Secondary | ICD-10-CM

## 2023-02-20 DIAGNOSIS — M7541 Impingement syndrome of right shoulder: Secondary | ICD-10-CM

## 2023-03-12 ENCOUNTER — Encounter (HOSPITAL_COMMUNITY): Payer: Self-pay | Admitting: Physician Assistant

## 2023-03-14 ENCOUNTER — Inpatient Hospital Stay
Admission: RE | Admit: 2023-03-14 | Discharge: 2023-03-14 | Discharge status: Home / Self Care | Payer: Medicare PPO | Source: Ambulatory Visit | Attending: Student

## 2023-03-14 DIAGNOSIS — S46011D Strain of muscle(s) and tendon(s) of the rotator cuff of right shoulder, subsequent encounter: Secondary | ICD-10-CM

## 2023-03-14 DIAGNOSIS — M7581 Other shoulder lesions, right shoulder: Secondary | ICD-10-CM

## 2023-03-14 DIAGNOSIS — M7521 Bicipital tendinitis, right shoulder: Secondary | ICD-10-CM

## 2023-03-14 DIAGNOSIS — M7541 Impingement syndrome of right shoulder: Secondary | ICD-10-CM

## 2023-03-19 ENCOUNTER — Other Ambulatory Visit: Payer: Self-pay | Admitting: Internal Medicine

## 2023-03-19 DIAGNOSIS — Z1231 Encounter for screening mammogram for malignant neoplasm of breast: Secondary | ICD-10-CM

## 2023-04-02 ENCOUNTER — Ambulatory Visit
Admission: RE | Admit: 2023-04-02 | Discharge: 2023-04-02 | Disposition: A | Discharge status: Home / Self Care | Payer: Medicare PPO | Source: Ambulatory Visit | Attending: Internal Medicine | Admitting: Internal Medicine

## 2023-04-02 DIAGNOSIS — Z1231 Encounter for screening mammogram for malignant neoplasm of breast: Secondary | ICD-10-CM | POA: Diagnosis present

## 2023-04-03 ENCOUNTER — Ambulatory Visit: Payer: Medicare PPO | Admitting: Oncology

## 2023-04-07 ENCOUNTER — Encounter: Payer: Self-pay | Admitting: Oncology

## 2023-04-07 ENCOUNTER — Inpatient Hospital Stay: Payer: Medicare PPO | Attending: Oncology | Admitting: Oncology

## 2023-04-07 VITALS — BP 156/67 | HR 71 | Temp 97.6°F | Wt 187.3 lb

## 2023-04-07 DIAGNOSIS — Z79811 Long term (current) use of aromatase inhibitors: Secondary | ICD-10-CM | POA: Diagnosis not present

## 2023-04-07 DIAGNOSIS — Z8551 Personal history of malignant neoplasm of bladder: Secondary | ICD-10-CM | POA: Diagnosis not present

## 2023-04-07 DIAGNOSIS — D0512 Intraductal carcinoma in situ of left breast: Secondary | ICD-10-CM | POA: Diagnosis not present

## 2023-04-07 DIAGNOSIS — Z923 Personal history of irradiation: Secondary | ICD-10-CM | POA: Diagnosis not present

## 2023-04-07 NOTE — Assessment & Plan Note (Signed)
noninvasive bladder cancer Patient follow-up with urology Dr. Erlene Quan

## 2023-04-07 NOTE — Assessment & Plan Note (Addendum)
#  Left breast DCIS, patient is doing very well clinically. She has finished 5+ years of antiestrogen treatments. Dec 2024 mammogram results are pending.  Continue annual mammogram surveillance- ordered through PCP's office.   Patient prefers to continue follow-up annually with oncology clinic.

## 2023-04-07 NOTE — Progress Notes (Signed)
Patient here for oncology follow-up appointment,  concerns of chronic SOB

## 2023-04-08 NOTE — Progress Notes (Signed)
 Hematology/Oncology Progress note [phone] [phone]     ASSESSMENT & PLAN:   Ductal carcinoma in situ (DCIS) of left breast #Left breast DCIS, patient is doing very well clinically. She has finished 5+ years of antiestrogen treatments. Dec 2024 mammogram results are pending.  Continue annual mammogram surveillance- ordered through PCP's office.   Patient prefers to continue follow-up annually with oncology clinic.  History of bladder cancer noninvasive bladder cancer Patient follow-up with urology Dr. Penne  Follow up in 1 year All questions were answered. The patient knows to call the clinic with any problems, questions or concerns.  Zelphia Cap, MD, PhD [facility] Hematology Oncology 04/07/2023    Chief Complaint: Alyssa Duncan is a 83 y.o. female with left breast DCIS who is seen for follow up   PERTINENT ONCOLOGY HISTORY SACRED ROA is a 83 y.o.afemale previously followed up with Dr. Rudell.  She switched care to me on 12/17/2018. 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  Pathology revealed a 3.6 cm area of grade II ductal carcinoma in situ. Margins were clear but close at 1 mm.  DCIS was ER + (> 90%), PR + (> 90%).  Pathologic stage was TisNx.  Status post adjuvant radiation which was completed in 06/2012.    Antiestrogen treatments : she was initially treated with Femara, but discontinued secondary to joint pains.  She tried tamoxifen, but discontinued secondary to swelling.  She began Aromasin  since 11/16/2012.  Stopped Aromasin  in September 2019 after finished total of 5 years of antiestrogen treatments.  Bilateral diagnostic mammogram on 03/24/2016 revealed no evidence of malignancy.  Diagnostic left mammogram and ultrasound on 06/09/2016 revealed an irregular hypoechogenicity in the left breast at 6 o'clock 2 cm from the nipple measuring 9 x 7 x 10 mm. This was felt likely  to represent post treatment change but underlying malignancy could not be excluded.  Ultrasound of the left axilla revealed no enlarged adenopathy.   Ultrasound guided biopsy on 06/19/2016 revealed changes c/w organizing fat necrosis.  Bilateral diagnostic mammogram on 03/13/2017 that revealed a suspicious 6 mm mass in the superficial upper outer left breast.  Biopsy on 03/24/2017 results revealed fat necrosis with dystrophic calcification.   Bone density on 08/22/2015 was normal with a T-score of -1.0 in the left femur.  Bone density on 08/25/2017 was normal with a T-score of -0.8 in the femoral neck.  #, 2017, history of non-muscle invasive bladder cancer.  She underwent TURBT on 01/02/2016.  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  Cystoscopy on 05/01/2016 revealed a very small low-grade appearing recurrence.  She underwent cystoscopy and fulguration on 05/06/2016.  Cystoscopy on 04/29/2017 revealed no evidence of disease.  Next cystoscopy planned for 07/28/2017.  # She has stopped Arimidex  per Dr. Isaiah recommendation after being seen in September 2020  INTERVAL HISTORY Alyssa Duncan is a 83 y.o. female who has above history reviewed by me today presents for follow up visit for management of history of DCIS Problems and complaints are listed below: She finished 5 years of endocrine therapy.  Patient reports feeling well.  She denies any new breast concerns.  She has chronic intermittent soreness of the left breast lumpectomy sites.  No new complaints.   Past Medical History:  Diagnosis Date   Acute biliary pancreatitis    Aortic atherosclerosis (HCC)    Arthritis    Asthma    Back pain  Bladder cancer (HCC)    CAD (coronary artery disease) 02/07/2021   a.) cCTA 02/07/2021: Ca2+ 452 (76th %'ile for age/sex/race match control); 25% oRCA, 50-69% pLAD; FFRct: LAD = 0.88, LCx = 0.90, RCA = 0.89   Choledocholithiasis    Diastolic dysfunction     a.) TTE 025/10/2017: EF 55-60%, no RWMAs, G1DD, triv AR; b.) TTE 05/15/2022: EF 50-55%, mild LVH, G1DD, mild reduced RVSF, mild LA dil, mild MR/AR   Difficult intubation 05/2017   a.) macroglossia; b.) reduced cervical mobility; c.) anterior larynx   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   a.) CNB 03/15/2012 --> pathology (+) for low grade ductal and lobular carcinoma in situ; b.) s/p wide excision 04/05/2012 --> final pathology (+) for 3.6 cm (grade II)  ER/PR (+) DCIS;  b.) s/p XRT (5000 cGY) with 1600 cGY scar boost (completed 06/2012) + 5 years endocrine therapy (exemestane )   Edema leg    Family history of adverse reaction to anesthesia    a.) delayed emergence in 1st degree relative (Father)   Gallstone pancreatitis 05/13/2017   GERD (gastroesophageal reflux disease)    Hyperlipidemia    Hypertension    Hypothyroidism    Kidney stones 12/2015   LBBB (left bundle branch block)    LEFT adrenal nodule (HCC)    Lesion of bladder 12/2022   Long-term use of aspirin  therapy    Obesity    OSA on CPAP    PAF (paroxysmal atrial fibrillation) (HCC) 2017   a.) single episode in setting of urosepis related to nephrolithiasis; no recurrence.   Personal history of radiation therapy 2013   BREAST CA   Sepsis (HCC)    Shingles    Skin cancer 2005   left arm and nose    Past Surgical History:  Procedure Laterality Date   BREAST BIOPSY Left 2013   POS   BREAST BIOPSY Left 2018   benign fat necrosis   BREAST BIOPSY Left 04/24/2022   us  biopsy/heart clip/benign mammary parenchyma with dense stromal fibrosis and fibroadenomatoid changes; focal chages sugguestive of remote fat necrosis; no atypia or malignancy   BREAST BIOPSY Left 04/24/2022   US  LT BREAST BX W LOC DEV 1ST LESION IMG BX SPEC US  GUIDE 04/24/2022 [facility]   BREAST BIOPSY Left 05/01/2022   MM LT BREAST BX W LOC DEV 1ST LESION IMAGE BX SPEC STEREO GUIDE 05/01/2022 [facility]   BREAST LUMPECTOMY  Left 2013   CARPAL TUNNEL RELEASE Right    CATARACT EXTRACTION Bilateral    CHOLECYSTECTOMY N/A 05/15/2017   Procedure: LAPAROSCOPIC CHOLECYSTECTOMY WITH INTRAOPERATIVE CHOLANGIOGRAM;  Surgeon: Nicholaus Selinda Birmingham, MD;  Location: [facility];  Service: General;  Laterality: N/A;   COLONOSCOPY WITH PROPOFOL  N/A 05/10/2015   Procedure: COLONOSCOPY WITH PROPOFOL ;  Surgeon: Deward CINDERELLA Piedmont, MD;  Location: [facility];  Service: Gastroenterology;  Laterality: N/A;   CYSTOSCOPY W/ RETROGRADES Bilateral 01/02/2016   Procedure: CYSTOSCOPY WITH RETROGRADE PYELOGRAM;  Surgeon: Rosina Riis, MD;  Location: [facility];  Service: Urology;  Laterality: Bilateral;   CYSTOSCOPY W/ RETROGRADES Bilateral 11/04/2017   Procedure: CYSTOSCOPY WITH RETROGRADE PYELOGRAM;  Surgeon: Riis Rosina, MD;  Location: [facility];  Service: Urology;  Laterality: Bilateral;   CYSTOSCOPY W/ RETROGRADES Bilateral 01/05/2023   Procedure: CYSTOSCOPY WITH RETROGRADE PYELOGRAM;  Surgeon: Riis Rosina, MD;  Location: [facility];  Service: Urology;  Laterality: Bilateral;   CYSTOSCOPY W/ URETERAL STENT PLACEMENT Right 01/02/2016   Procedure: CYSTOSCOPY WITH STENT REPLACEMENT;  Surgeon: Rosina Riis, MD;  Location: [facility];  Service: Urology;  Laterality: Right;   CYSTOSCOPY WITH BIOPSY N/A 01/05/2023   Procedure: CYSTOSCOPY WITH BLADDER BIOPSY;  Surgeon: Penne Knee, MD;  Location: [facility];  Service: Urology;  Laterality: N/A;   CYSTOSCOPY WITH STENT PLACEMENT Right 12/04/2015   Procedure: CYSTOSCOPY WITH STENT PLACEMENT;  Surgeon: Knee Penne, MD;  Location: [facility];  Service: Urology;  Laterality: Right;   ENDOSCOPIC RETROGRADE CHOLANGIOPANCREATOGRAPHY (ERCP) WITH PROPOFOL  N/A 05/14/2017   Procedure: ENDOSCOPIC RETROGRADE CHOLANGIOPANCREATOGRAPHY (ERCP) WITH PROPOFOL ;  Surgeon: Jinny Carmine, MD;  Location: [facility];  Service: Endoscopy;  Laterality: N/A;   KNEE ARTHROSCOPY Right    SEPTOPLASTY     TONSILLECTOMY     age 40    TOTAL HIP ARTHROPLASTY Left 2013   TRANSURETHRAL RESECTION OF BLADDER TUMOR WITH MITOMYCIN -C N/A 01/02/2016   Procedure: TRANSURETHRAL RESECTION OF BLADDER TUMOR WITH MITOMYCIN -C;  Surgeon: Knee Penne, MD;  Location: [facility];  Service: Urology;  Laterality: N/A;   URETEROSCOPY WITH HOLMIUM LASER LITHOTRIPSY Right 01/02/2016   Procedure: URETEROSCOPY WITH HOLMIUM LASER LITHOTRIPSY;  Surgeon: Knee Penne, MD;  Location: [facility];  Service: Urology;  Laterality: Right;   UVULOPALATOPHARYNGOPLASTY     and tongue surgery    Family History  Problem Relation Age of Onset   Breast cancer Mother 84   Heart disease Father    Heart attack Father    Bladder Cancer Father    Esophageal cancer Sister 58   Skin cancer Sister    Kidney Stones Brother    Skin cancer Brother        worked on farm   Prostate cancer Brother        dx late 62s   Skin cancer Daughter    Breast cancer Daughter 39   Skin cancer Daughter    Melanoma Daughter    Heart disease Daughter    Heart disease Son     Social History:  reports that she has never smoked. She has never used smokeless tobacco. She reports that she does not drink alcohol and does not use drugs.  The patient is alone today.  Allergies:  Allergies  Allergen Reactions   Ativan  [Lorazepam ] Other (See Comments)    Hysteria    Cardura [Doxazosin] Other (See Comments)    unsure   Clonidine Derivatives Other (See Comments)    unsure   Enalapril Other (See Comments)    unsure   Levaquin [Levofloxacin] Other (See Comments)    Couldn't raise her arms   Mobic [Meloxicam] Other (See Comments)    unsure   Sulfa Antibiotics Nausea Only   Vicodin [Hydrocodone-Acetaminophen ] Other (See Comments)    unsure    Current Medications: Current Outpatient Medications  Medication Sig Dispense Refill   acetaminophen  (TYLENOL ) 500 MG tablet Take 1,000 mg by mouth 2 (two) times daily.     amLODipine  (NORVASC ) 10 MG tablet Take 10 mg by mouth at  bedtime.     aspirin  EC 81 MG tablet Take 1 tablet (81 mg total) by mouth daily. 90 tablet 3   Calcium  Carb-Cholecalciferol 600-800 MG-UNIT TABS Take 1 tablet by mouth 2 (two) times daily.     CRANBERRY EXTRACT PO Take by mouth.     docusate sodium  (COLACE) 50 MG capsule Take 50 mg by mouth daily as needed.     dorzolamide -timolol  (COSOPT ) 22.3-6.8 MG/ML ophthalmic solution Place 1 drop into both eyes 2 (two) times daily.      furosemide  (LASIX ) 20 MG tablet Take 20 mg  by mouth as needed.     hydrochlorothiazide  (HYDRODIURIL ) 25 MG tablet Take 1 tablet by mouth every morning.     ibuprofen  (ADVIL ,MOTRIN ) 200 MG tablet Take 400-600 mg by mouth every 6 (six) hours as needed for headache or moderate pain.      levothyroxine  (SYNTHROID , LEVOTHROID) 75 MCG tablet Take 75 mcg by mouth daily before breakfast.     loratadine  (CLARITIN ) 10 MG tablet Take 10 mg by mouth at bedtime.     losartan (COZAAR) 100 MG tablet Take 100 mg by mouth daily before lunch.     lovastatin (MEVACOR) 40 MG tablet Take 40 mg by mouth at bedtime.     LUMIGAN 0.01 % SOLN Place 1 drop into both eyes at bedtime.      metoprolol  tartrate (LOPRESSOR ) 100 MG tablet Take 100 mg by mouth 2 (two) times daily.     pantoprazole  (PROTONIX ) 40 MG tablet Take 40 mg by mouth at bedtime.      potassium chloride  SA (K-DUR,KLOR-CON ) 20 MEQ tablet Take 20 mEq by mouth 2 (two) times daily.     Prenatal Vit-Fe Fumarate-FA (PRENATAL MULTIVITAMIN) TABS tablet Take 1 tablet by mouth daily at 12 noon.     vitamin B-12 (CYANOCOBALAMIN) 1000 MCG tablet Take 1,000 mcg by mouth daily.     vitamin C (ASCORBIC ACID) 500 MG tablet Take 500 mg by mouth daily.     No current facility-administered medications for this visit.   Review of Systems  Constitutional:  Negative for appetite change, chills, fatigue and fever.  HENT:   Negative for hearing loss and voice change.   Eyes:  Negative for eye problems.  Respiratory:  Negative for chest tightness and  cough.   Cardiovascular:  Negative for chest pain.  Gastrointestinal:  Negative for abdominal distention, abdominal pain and blood in stool.  Endocrine: Negative for hot flashes.  Genitourinary:  Negative for difficulty urinating and frequency.   Skin:  Negative for itching and rash.  Neurological:  Negative for extremity weakness.  Hematological:  Negative for adenopathy.  Psychiatric/Behavioral:  Negative for confusion.      Physical Exam: Blood pressure (!) 156/67, pulse 71, temperature 97.6 F (36.4 C), temperature source Tympanic, weight 187 lb 4.8 oz (85 kg).  Physical Exam Constitutional:      General: She is not in acute distress.    Appearance: She is not diaphoretic.     Comments: Patient walks independently  HENT:     Head: Normocephalic and atraumatic.     Nose: Nose normal.     Mouth/Throat:     Pharynx: No oropharyngeal exudate.  Eyes:     General: No scleral icterus.    Pupils: Pupils are equal, round, and reactive to light.  Cardiovascular:     Rate and Rhythm: Normal rate and regular rhythm.     Heart sounds: No murmur heard. Pulmonary:     Effort: Pulmonary effort is normal. No respiratory distress.     Breath sounds: No rales.  Chest:     Chest wall: No tenderness.  Abdominal:     General: There is no distension.     Palpations: Abdomen is soft.     Tenderness: There is no abdominal tenderness.  Musculoskeletal:        General: Normal range of motion.     Cervical back: Normal range of motion and neck supple.  Skin:    General: Skin is warm and dry.     Findings: No erythema.  Neurological:     Mental Status: She is alert and oriented to person, place, and time.     Cranial Nerves: No cranial nerve deficit.     Motor: No abnormal muscle tone.     Coordination: Coordination normal.  Psychiatric:        Mood and Affect: Affect normal.    Breast exam was performed in seated and lying down position. Patient is status post left lumpectomy with a  well-healed surgical scar.  Left breast post-operative changes between 6 o'clock and 6:30 position.  Left nipple indentation with scar.  Right breast no palpable mass. Chronically retracted nipple. No palpable masses or axillary adenopathy bilaterally.    No visits with results within 3 Day(s) from this visit.  Latest known visit with results is:  Admission on 01/05/2023, Discharged on 01/05/2023  Component Date Value Ref Range Status   SURGICAL PATHOLOGY 01/05/2023    Final-Edited                   Value:SURGICAL PATHOLOGY [facility] [address] [place], [place] [address] Telephone [phone] or [phone] Fax [phone]  REPORT OF SURGICAL PATHOLOGY   Accession #: [id number] Patient Name: TIWATOPE, EMMITT Visit # : [id number]  MRN: [id number] Physician: Penne Knee DOB/Age March 17, 1941 (Age: 70) Gender: F Collected Date: 01/05/2023 Received Date: 01/05/2023  FINAL DIAGNOSIS       1. Bladder, biopsy,  :       - EARLY LOW-GRADE PAPILLARY UROTHELIAL CARCINOMA, NON-INVASIVE (WHO/ISUP).      - MUSCULARIS PROPRIA IS PRESENT AND NOT INVOLVED.       Diagnosis Note : Per [facility] the patient has a history of non-invasive low-grade      papillary urothelial carcinoma (2017).      DATE SIGNED OUT: 01/06/2023 ELECTRONIC SIGNATURE : Janel Md, Rexene , Pathologist, Electronic Signature  MICROSCOPIC DESCRIPTION  CASE COMMENTS STAINS USED IN DIAGNOSIS: H&E    CLINICAL HISTORY  SPECIMEN(S) OBTAINED 1. Bladder, biopsy,  SP                         ECIMEN COMMENTS: SPECIMEN CLINICAL INFORMATION: 1. Bladder lesion    Gross Description 1. Received in formalin, labeled bladder biopsy, is a 0.5 x 0.3 x 0.2 cm aggregate of 3 soft, tan tissue fragments submitted entirely in block 1A.      SMB      01/05/2023        Report signed out from the following location(s) [place] [address] [place], [place]  [address] CLIA #: [id number]  [facility] [address] [place], [place] [address] CLIA #: [id number]

## 2023-04-10 ENCOUNTER — Other Ambulatory Visit: Payer: Self-pay | Admitting: Internal Medicine

## 2023-04-10 ENCOUNTER — Other Ambulatory Visit: Payer: Self-pay | Admitting: Specialist

## 2023-04-10 DIAGNOSIS — N6489 Other specified disorders of breast: Secondary | ICD-10-CM

## 2023-04-10 DIAGNOSIS — R928 Other abnormal and inconclusive findings on diagnostic imaging of breast: Secondary | ICD-10-CM

## 2023-04-10 DIAGNOSIS — R918 Other nonspecific abnormal finding of lung field: Secondary | ICD-10-CM

## 2023-04-14 ENCOUNTER — Ambulatory Visit: Payer: Medicare PPO | Admitting: Urology

## 2023-04-14 ENCOUNTER — Encounter: Payer: Self-pay | Admitting: Urology

## 2023-04-14 ENCOUNTER — Ambulatory Visit
Admission: RE | Admit: 2023-04-14 | Discharge: 2023-04-14 | Disposition: A | Discharge status: Home / Self Care | Payer: Medicare PPO | Source: Ambulatory Visit | Attending: Internal Medicine | Admitting: Internal Medicine

## 2023-04-14 VITALS — BP 144/72 | HR 65 | Ht 67.0 in | Wt 186.0 lb

## 2023-04-14 DIAGNOSIS — Z8551 Personal history of malignant neoplasm of bladder: Secondary | ICD-10-CM | POA: Diagnosis not present

## 2023-04-14 DIAGNOSIS — N6489 Other specified disorders of breast: Secondary | ICD-10-CM | POA: Diagnosis present

## 2023-04-14 DIAGNOSIS — Z08 Encounter for follow-up examination after completed treatment for malignant neoplasm: Secondary | ICD-10-CM

## 2023-04-14 DIAGNOSIS — R928 Other abnormal and inconclusive findings on diagnostic imaging of breast: Secondary | ICD-10-CM | POA: Diagnosis present

## 2023-04-14 DIAGNOSIS — C679 Malignant neoplasm of bladder, unspecified: Secondary | ICD-10-CM

## 2023-04-14 LAB — MICROSCOPIC EXAMINATION

## 2023-04-14 LAB — URINALYSIS, COMPLETE
Bilirubin, UA: NEGATIVE
Glucose, UA: NEGATIVE
Ketones, UA: NEGATIVE
Nitrite, UA: NEGATIVE
Specific Gravity, UA: 1.015 (ref 1.005–1.030)
Urobilinogen, Ur: 0.2 mg/dL (ref 0.2–1.0)
pH, UA: 7 (ref 5.0–7.5)

## 2023-04-14 NOTE — Progress Notes (Signed)
   04/14/23  CC:  Chief Complaint  Patient presents with   Cysto     HPI: Alyssa Duncan is a 83 y.o.female  with a personal history of malignant neoplasm of interior wall of urinary bladder and history of kidney stones, who presents today for an annual surveillance cystoscopy.   Patient was admitted on 12/03/2015 with fevers, right flank pain found to have an obstructing right distal 5 mm ureteral stone. She was taken to the operating room for cystoscopy, right ureteral stent placement in the setting of worsening leukocytosis and uncontrolled pain.  Intraoperatively, an incidental bladder tumor was identified.     She returned to the operating room on 01/02/2016 for definitive management of her stone via ureteroscopy, laser lithotripsy followed by TURBT. Surgical pathology was consistent with low-grade noninvasive bladder cancer. 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   Cysto in office on 05/01/2016 showed a very small low-grade appearing recurrence ~2 mm, anterior bladder wall in close proximity to the bladder neck. She underwent cystoscopy, fulguration of this lesion in the office shortly thereafter which was well tolerated.     Patient  had concern for possible early recurrence on 10/2017 with some subtle carpeting around the stellate scar as well as focal area of erythema.  She was taken to the operating room shortly thereafter at which time no pathology was identified.  Bilateral retrogrades were unremarkable.     She returned to the operating room on 01/05/2023 for cystoscopy, bladder biopsy and bilateral retrograde pyelogram with again notable inflammatory changes around her right hemitrigone.  Biopsy was consistent early low-grade recurrence, noninvasive.  Muscularis propria was present and not involved.  She did have significant apical and anterior vaginal prolapse.   NED. A&Ox3.   No respiratory distress   Abd soft, NT, ND Normal external genitalia with  patent urethral meatus.  Prolapse reduced just prior to cystoscopy.  Cystoscopy Procedure Note  Patient identification was confirmed, informed consent was obtained, and patient was prepped using Betadine solution.  Lidocaine  jelly was administered per urethral meatus.    Procedure: - Flexible cystoscope introduced, without any difficulty.   - Thorough search of the bladder revealed:    normal urethral meatus    normal urothelium with inflammatory changes around the trigonal ridge.  There is a ridge extending on the right side linear posterior lateral from the trigone which also has inflammatory changes versus early papillary carpeting.    no stones    no ulcers       no urethral polyps       Post-Procedure: - Patient tolerated the procedure well  Assessment/ Plan:  History of bladder cancer Somewhat suspicious for early recurrence/tumor persistence.  Given very early changes and its low-grade superficial nature, she is well to watch this for period of time and if it progresses, return to the operating room for TURBT.  She understands and is agreeable this plan.  Will plan to repeat cystoscopy in about 3 months.  All questions answered.  Rosina Riis, MD

## 2023-04-16 ENCOUNTER — Other Ambulatory Visit: Payer: Self-pay | Admitting: Internal Medicine

## 2023-04-16 DIAGNOSIS — N63 Unspecified lump in unspecified breast: Secondary | ICD-10-CM

## 2023-04-16 DIAGNOSIS — R928 Other abnormal and inconclusive findings on diagnostic imaging of breast: Secondary | ICD-10-CM

## 2023-04-22 ENCOUNTER — Ambulatory Visit
Admission: RE | Admit: 2023-04-22 | Discharge: 2023-04-22 | Disposition: A | Discharge status: Home / Self Care | Payer: Medicare PPO | Source: Ambulatory Visit | Attending: Specialist | Admitting: Specialist

## 2023-04-22 DIAGNOSIS — R918 Other nonspecific abnormal finding of lung field: Secondary | ICD-10-CM | POA: Insufficient documentation

## 2023-04-23 ENCOUNTER — Ambulatory Visit
Admission: RE | Admit: 2023-04-23 | Discharge: 2023-04-23 | Disposition: A | Discharge status: Home / Self Care | Payer: Medicare PPO | Source: Ambulatory Visit | Attending: Internal Medicine | Admitting: Internal Medicine

## 2023-04-23 DIAGNOSIS — N63 Unspecified lump in unspecified breast: Secondary | ICD-10-CM | POA: Diagnosis present

## 2023-04-23 DIAGNOSIS — R928 Other abnormal and inconclusive findings on diagnostic imaging of breast: Secondary | ICD-10-CM | POA: Insufficient documentation

## 2023-04-23 DIAGNOSIS — D241 Benign neoplasm of right breast: Secondary | ICD-10-CM | POA: Insufficient documentation

## 2023-04-23 HISTORY — PX: BREAST BIOPSY: SHX20

## 2023-04-23 MED ORDER — LIDOCAINE 1 % OPTIME INJ - NO CHARGE
2.0000 mL | Freq: Once | INTRAMUSCULAR | Status: AC
  Administered 2023-04-23: 2 mL
  Filled 2023-04-23: qty 2

## 2023-04-23 MED ORDER — LIDOCAINE-EPINEPHRINE 2 %-1:100000 IJ SOLN
8.0000 mL | Freq: Once | INTRAMUSCULAR | Status: AC
  Administered 2023-04-23: 8 mL
  Filled 2023-04-23: qty 8.5

## 2023-04-24 ENCOUNTER — Encounter: Payer: Self-pay | Admitting: *Deleted

## 2023-04-24 LAB — SURGICAL PATHOLOGY

## 2023-04-24 NOTE — Progress Notes (Signed)
Referral recieved from [facility] for benign breast mass.  She has seen Dr. Tonna Boehringer previously for a different issue and would like to see him again.   The office is closed on Friday afternoons, I will call Monday morning for an appt.

## 2023-04-27 ENCOUNTER — Encounter: Payer: Self-pay | Admitting: *Deleted

## 2023-04-27 NOTE — Progress Notes (Signed)
Ms. Theilen will see Dr. Tonna Boehringer tomorrow at 10:30.   No further needs at this time.

## 2023-05-25 ENCOUNTER — Telehealth: Payer: Self-pay | Admitting: Urology

## 2023-05-25 NOTE — Telephone Encounter (Signed)
 Pt wants to know if she should been seen earlier than 4/09 for her cysto. She has upcoming shoulder surgery and consult is 03/11. Please advise pt

## 2023-05-25 NOTE — Telephone Encounter (Signed)
 Last Cysto was in January and advise at that time and what patient thought she should do was to wait 3 months to re access. IN setting of having upcoming shoulder surgery and work up for that should patient come back sooner if we have availability or wait until April as scheduled?

## 2023-05-26 NOTE — Telephone Encounter (Signed)
 Recommend follow-up as scheduled  Vanna Scotland, MD

## 2023-05-26 NOTE — Telephone Encounter (Signed)
 Patient advised.

## 2023-06-16 ENCOUNTER — Ambulatory Visit: Payer: Medicare PPO | Admitting: Cardiovascular Disease

## 2023-07-02 ENCOUNTER — Other Ambulatory Visit: Payer: Self-pay

## 2023-07-02 ENCOUNTER — Ambulatory Visit: Admitting: Physician Assistant

## 2023-07-02 ENCOUNTER — Ambulatory Visit
Admission: RE | Admit: 2023-07-02 | Discharge: 2023-07-02 | Disposition: A | Discharge status: Home / Self Care | Source: Ambulatory Visit | Attending: Physician Assistant | Admitting: *Deleted

## 2023-07-02 ENCOUNTER — Ambulatory Visit
Admission: RE | Admit: 2023-07-02 | Discharge: 2023-07-02 | Disposition: A | Discharge status: Home / Self Care | Source: Ambulatory Visit | Attending: Physician Assistant | Admitting: Physician Assistant

## 2023-07-02 VITALS — BP 135/70 | HR 69 | Ht 61.0 in | Wt 189.2 lb

## 2023-07-02 DIAGNOSIS — Z87442 Personal history of urinary calculi: Secondary | ICD-10-CM

## 2023-07-02 DIAGNOSIS — R109 Unspecified abdominal pain: Secondary | ICD-10-CM | POA: Diagnosis not present

## 2023-07-02 DIAGNOSIS — N201 Calculus of ureter: Secondary | ICD-10-CM

## 2023-07-02 LAB — URINALYSIS, COMPLETE
Bilirubin, UA: NEGATIVE
Glucose, UA: NEGATIVE
Ketones, UA: NEGATIVE
Leukocytes,UA: NEGATIVE
Nitrite, UA: NEGATIVE
Protein,UA: NEGATIVE
RBC, UA: NEGATIVE
Specific Gravity, UA: 1.015 (ref 1.005–1.030)
Urobilinogen, Ur: 0.2 mg/dL (ref 0.2–1.0)
pH, UA: 7 (ref 5.0–7.5)

## 2023-07-02 LAB — MICROSCOPIC EXAMINATION

## 2023-07-02 NOTE — Progress Notes (Signed)
 07/02/2023 1:58 PM   Alyssa Duncan 03/10/1941 [id number]  CC: Chief Complaint  Patient presents with   Flank Pain   HPI: Alyssa Duncan is a 83 y.o. female with PMH bladder cancer with possible early recurrence/persistence due for surveillance cystoscopy next month who presents today for evaluation of possible acute stone episode.   Today she reports she completed physical therapy early last week in advance of her right shoulder replacement.  She thought she had pulled a muscle in her been having some shoulder discomfort ever since, but it failed to improve.  This morning, the pain moved down into her right flank, which made her concerned for possible kidney stone.  The pain is exacerbated by deep breathing and she has difficulty staying comfortable at night.  She denies fever, chills, nausea, vomiting, bowel changes, chest pain shot, shortness of breath, jaw pain, or palpitations.  KUB today with no radiopaque urolithiasis.  In-office UA and microscopy today pan negative.  PMH: Past Medical History:  Diagnosis Date   Acute biliary pancreatitis    Aortic atherosclerosis (HCC)    Arthritis    Asthma    Back pain    Bladder cancer (HCC)    CAD (coronary artery disease) 02/07/2021   a.) cCTA 02/07/2021: Ca2+ 452 (76th %'ile for age/sex/race match control); 25% oRCA, 50-69% pLAD; FFRct: LAD = 0.88, LCx = 0.90, RCA = 0.89   Choledocholithiasis    Diastolic dysfunction    a.) TTE 025/10/2017: EF 55-60%, no RWMAs, G1DD, triv AR; b.) TTE 05/15/2022: EF 50-55%, mild LVH, G1DD, mild reduced RVSF, mild LA dil, mild MR/AR   Difficult intubation 05/2017   a.) macroglossia; b.) reduced cervical mobility; c.) anterior larynx   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   a.) CNB 03/15/2012 --> pathology (+) for low grade ductal and lobular carcinoma in situ; b.) s/p wide excision 04/05/2012 --> final pathology (+) for 3.6 cm (grade II)  ER/PR (+) DCIS;  b.) s/p XRT (5000 cGY)  with 1600 cGY scar boost (completed 06/2012) + 5 years endocrine therapy (exemestane)   Edema leg    Family history of adverse reaction to anesthesia    a.) delayed emergence in 1st degree relative (Father)   Gallstone pancreatitis 05/13/2017   GERD (gastroesophageal reflux disease)    Hyperlipidemia    Hypertension    Hypothyroidism    Kidney stones 12/2015   LBBB (left bundle branch block)    LEFT adrenal nodule (HCC)    Lesion of bladder 12/2022   Long-term use of aspirin therapy    Obesity    OSA on CPAP    PAF (paroxysmal atrial fibrillation) (HCC) 2017   a.) single episode in setting of urosepis related to nephrolithiasis; no recurrence.   Personal history of radiation therapy 2013   BREAST CA   Sepsis (HCC)    Shingles    Skin cancer 2005   left arm and nose    Surgical History: Past Surgical History:  Procedure Laterality Date   BREAST BIOPSY Left 2013   POS   BREAST BIOPSY Left 2018   benign fat necrosis   BREAST BIOPSY Left 04/24/2022   US biopsy/heart clip/benign mammary parenchyma with dense stromal fibrosis and fibroadenomatoid changes; focal chages sugguestive of remote fat necrosis; no atypia or malignancy   BREAST BIOPSY Left 04/24/2022   [place] LT BREAST BX W LOC DEV 1ST LESION IMG BX SPEC US GUIDE 04/24/2022 [facility]   BREAST BIOPSY Left 05/01/2022  MM LT BREAST BX W LOC DEV 1ST LESION IMAGE BX SPEC STEREO GUIDE 05/01/2022 [facility]   BREAST BIOPSY Right 04/23/2023   u/s bx 9:30 heart clip path pending   BREAST BIOPSY Right 04/23/2023   [place] RT BREAST BX W LOC DEV 1ST LESION IMG BX SPEC US GUIDE 04/23/2023 [facility]   BREAST LUMPECTOMY Left 2013   CARPAL TUNNEL RELEASE Right    CATARACT EXTRACTION Bilateral    CHOLECYSTECTOMY N/A 05/15/2017   Procedure: LAPAROSCOPIC CHOLECYSTECTOMY WITH INTRAOPERATIVE CHOLANGIOGRAM;  Surgeon: Ancil Linsey, MD;  Location: [facility];  Service: General;  Laterality: N/A;   COLONOSCOPY WITH PROPOFOL N/A  05/10/2015   Procedure: COLONOSCOPY WITH PROPOFOL;  Surgeon: Wallace Cullens, MD;  Location: [facility];  Service: Gastroenterology;  Laterality: N/A;   CYSTOSCOPY W/ RETROGRADES Bilateral 01/02/2016   Procedure: CYSTOSCOPY WITH RETROGRADE PYELOGRAM;  Surgeon: Vanna Scotland, MD;  Location: [facility];  Service: Urology;  Laterality: Bilateral;   CYSTOSCOPY W/ RETROGRADES Bilateral 11/04/2017   Procedure: CYSTOSCOPY WITH RETROGRADE PYELOGRAM;  Surgeon: Vanna Scotland, MD;  Location: [facility];  Service: Urology;  Laterality: Bilateral;   CYSTOSCOPY W/ RETROGRADES Bilateral 01/05/2023   Procedure: CYSTOSCOPY WITH RETROGRADE PYELOGRAM;  Surgeon: Vanna Scotland, MD;  Location: [facility];  Service: Urology;  Laterality: Bilateral;   CYSTOSCOPY W/ URETERAL STENT PLACEMENT Right 01/02/2016   Procedure: CYSTOSCOPY WITH STENT REPLACEMENT;  Surgeon: Vanna Scotland, MD;  Location: [facility];  Service: Urology;  Laterality: Right;   CYSTOSCOPY WITH BIOPSY N/A 01/05/2023   Procedure: CYSTOSCOPY WITH BLADDER BIOPSY;  Surgeon: Vanna Scotland, MD;  Location: [facility];  Service: Urology;  Laterality: N/A;   CYSTOSCOPY WITH STENT PLACEMENT Right 12/04/2015   Procedure: CYSTOSCOPY WITH STENT PLACEMENT;  Surgeon: Vanna Scotland, MD;  Location: [facility];  Service: Urology;  Laterality: Right;   ENDOSCOPIC RETROGRADE CHOLANGIOPANCREATOGRAPHY (ERCP) WITH PROPOFOL N/A 05/14/2017   Procedure: ENDOSCOPIC RETROGRADE CHOLANGIOPANCREATOGRAPHY (ERCP) WITH PROPOFOL;  Surgeon: Midge Minium, MD;  Location: [facility];  Service: Endoscopy;  Laterality: N/A;   KNEE ARTHROSCOPY Right    SEPTOPLASTY     TONSILLECTOMY     age 6   TOTAL HIP ARTHROPLASTY Left 2013   TRANSURETHRAL RESECTION OF BLADDER TUMOR WITH MITOMYCIN-C N/A 01/02/2016   Procedure: TRANSURETHRAL RESECTION OF BLADDER TUMOR WITH MITOMYCIN-C;  Surgeon: Vanna Scotland, MD;  Location: [facility];  Service: Urology;  Laterality: N/A;   URETEROSCOPY WITH HOLMIUM LASER  LITHOTRIPSY Right 01/02/2016   Procedure: URETEROSCOPY WITH HOLMIUM LASER LITHOTRIPSY;  Surgeon: Vanna Scotland, MD;  Location: [facility];  Service: Urology;  Laterality: Right;   UVULOPALATOPHARYNGOPLASTY     and tongue surgery    Home Medications:  Allergies as of 07/02/2023       Reactions   Ativan [lorazepam] Other (See Comments)   Hysteria    Cardura [doxazosin] Other (See Comments)   unsure   Clonidine Derivatives Other (See Comments)   unsure   Enalapril Other (See Comments)   unsure   Levaquin [levofloxacin] Other (See Comments)   Couldn't raise her arms   Mobic [meloxicam] Other (See Comments)   unsure   Sulfa Antibiotics Nausea Only   Vicodin [hydrocodone-acetaminophen] Other (See Comments)   unsure        Medication List        Accurate as of July 02, 2023  1:58 PM. If you have any questions, ask your nurse or doctor.          STOP taking these medications    ibuprofen 200 MG  tablet Commonly known as: ADVIL Stopped by: Carman Ching       TAKE these medications    acetaminophen 500 MG tablet Commonly known as: TYLENOL Take 1,000 mg by mouth 2 (two) times daily.   amLODipine 10 MG tablet Commonly known as: NORVASC Take 10 mg by mouth at bedtime.   ascorbic acid 500 MG tablet Commonly known as: VITAMIN C Take 500 mg by mouth daily.   aspirin EC 81 MG tablet Take 1 tablet (81 mg total) by mouth daily.   Calcium Carb-Cholecalciferol 600-800 MG-UNIT Tabs Take 1 tablet by mouth 2 (two) times daily.   CRANBERRY EXTRACT PO Take by mouth.   cyanocobalamin 1000 MCG tablet Commonly known as: VITAMIN B12 Take 1,000 mcg by mouth daily.   docusate sodium 50 MG capsule Commonly known as: COLACE Take 50 mg by mouth daily as needed.   dorzolamide-timolol 2-0.5 % ophthalmic solution Commonly known as: COSOPT Place 1 drop into both eyes 2 (two) times daily.   furosemide 20 MG tablet Commonly known as: LASIX Take 20 mg by mouth as  needed.   hydrochlorothiazide 25 MG tablet Commonly known as: HYDRODIURIL Take 1 tablet by mouth every morning.   levothyroxine 75 MCG tablet Commonly known as: SYNTHROID Take 75 mcg by mouth daily before breakfast.   loratadine 10 MG tablet Commonly known as: CLARITIN Take 10 mg by mouth at bedtime.   losartan 100 MG tablet Commonly known as: COZAAR Take 100 mg by mouth daily before lunch.   lovastatin 40 MG tablet Commonly known as: MEVACOR Take 40 mg by mouth at bedtime.   Lumigan 0.01 % Soln Generic drug: bimatoprost Place 1 drop into both eyes at bedtime.   metoprolol tartrate 100 MG tablet Commonly known as: LOPRESSOR Take 100 mg by mouth 2 (two) times daily.   pantoprazole 40 MG tablet Commonly known as: PROTONIX Take 40 mg by mouth at bedtime.   potassium chloride SA 20 MEQ tablet Commonly known as: KLOR-CON M Take 20 mEq by mouth 2 (two) times daily.   prenatal multivitamin Tabs tablet Take 1 tablet by mouth daily at 12 noon.        Allergies:  Allergies  Allergen Reactions   Ativan [Lorazepam] Other (See Comments)    Hysteria    Cardura [Doxazosin] Other (See Comments)    unsure   Clonidine Derivatives Other (See Comments)    unsure   Enalapril Other (See Comments)    unsure   Levaquin [Levofloxacin] Other (See Comments)    Couldn't raise her arms   Mobic [Meloxicam] Other (See Comments)    unsure   Sulfa Antibiotics Nausea Only   Vicodin [Hydrocodone-Acetaminophen] Other (See Comments)    unsure    Family History: Family History  Problem Relation Age of Onset   Breast cancer Mother 48   Heart disease Father    Heart attack Father    Bladder Cancer Father    Esophageal cancer Sister 73   Skin cancer Sister    Kidney Stones Brother    Skin cancer Brother        worked on farm   Prostate cancer Brother        dx late 30s   Skin cancer Daughter    Breast cancer Daughter 59   Skin cancer Daughter    Melanoma Daughter    Heart  disease Daughter    Heart disease Son     Social History:   reports that she has never smoked. She  has never used smokeless tobacco. She reports that she does not drink alcohol and does not use drugs.  Physical Exam: BP 135/70   Pulse 69   Ht 5\' 1"  (1.549 m)   Wt 189 lb 4 oz (85.8 kg)   BMI 35.76 kg/m   Constitutional:  Alert and oriented, no acute distress, nontoxic appearing HEENT: [place], AT Cardiovascular: No clubbing, cyanosis, or edema Respiratory: Normal respiratory effort, no increased work of breathing Skin: No rashes, bruises or suspicious lesions Neurologic: Grossly intact, no focal deficits, moving all 4 extremities Psychiatric: Normal mood and affect  Laboratory Data: Results for orders placed or performed in visit on 07/02/23  Microscopic Examination   Collection Time: 07/02/23  1:50 PM   Urine  Result Value Ref Range   WBC, UA 0-5 0 - 5 /hpf   RBC, Urine 0-2 0 - 2 /hpf   Epithelial Cells (non renal) 0-10 0 - 10 /hpf   Bacteria, UA Few None seen/Few  Urinalysis, Complete   Collection Time: 07/02/23  1:50 PM  Result Value Ref Range   Specific Gravity, UA 1.015 1.005 - 1.030   pH, UA 7.0 5.0 - 7.5   Color, UA Yellow Yellow   Appearance Ur Clear Clear   Leukocytes,UA Negative Negative   Protein,UA Negative Negative/Trace   Glucose, UA Negative Negative   Ketones, UA Negative Negative   RBC, UA Negative Negative   Bilirubin, UA Negative Negative   Urobilinogen, Ur 0.2 0.2 - 1.0 mg/dL   Nitrite, UA Negative Negative   Microscopic Examination See below:    Pertinent Imaging: KUB, 07/02/2023: See epic  I personally reviewed the images referenced above and note no radiopaque urolithiasis.  Assessment & Plan:   1. Flank pain with history of urolithiasis (Primary) UA is bland and KUB is negative.  Suspect her symptoms are more likely to be musculoskeletal in origin, however given interval since her last upper tract imaging will obtain renal ultrasound and  contact her with the results.  She is in agreement with this plan. - Urinalysis, Complete - US RENAL; Future   Return for Will call with results.  Carman Ching, PA-C  [facility] [place] [address] [place], [place] [address] [phone]

## 2023-07-03 ENCOUNTER — Telehealth: Payer: Self-pay | Admitting: Physician Assistant

## 2023-07-03 ENCOUNTER — Emergency Department

## 2023-07-03 ENCOUNTER — Encounter: Payer: Self-pay | Admitting: Physician Assistant

## 2023-07-03 ENCOUNTER — Other Ambulatory Visit: Payer: Self-pay

## 2023-07-03 ENCOUNTER — Emergency Department
Admission: EM | Admit: 2023-07-03 | Discharge: 2023-07-03 | Disposition: A | Discharge status: Home / Self Care | Attending: Emergency Medicine | Admitting: Emergency Medicine

## 2023-07-03 DIAGNOSIS — R109 Unspecified abdominal pain: Secondary | ICD-10-CM

## 2023-07-03 DIAGNOSIS — I2510 Atherosclerotic heart disease of native coronary artery without angina pectoris: Secondary | ICD-10-CM | POA: Diagnosis not present

## 2023-07-03 DIAGNOSIS — I10 Essential (primary) hypertension: Secondary | ICD-10-CM | POA: Insufficient documentation

## 2023-07-03 DIAGNOSIS — M545 Low back pain, unspecified: Secondary | ICD-10-CM | POA: Diagnosis present

## 2023-07-03 MED ORDER — ONDANSETRON 4 MG PO TBDP
4.0000 mg | ORAL_TABLET | Freq: Once | ORAL | Status: AC
  Administered 2023-07-03: 4 mg via ORAL
  Filled 2023-07-03: qty 1

## 2023-07-03 MED ORDER — OXYCODONE-ACETAMINOPHEN 5-325 MG PO TABS
1.0000 | ORAL_TABLET | Freq: Once | ORAL | Status: AC
  Administered 2023-07-03: 1 via ORAL
  Filled 2023-07-03: qty 1

## 2023-07-03 MED ORDER — PREDNISONE 10 MG PO TABS: ORAL_TABLET | ORAL | 0 refills | Status: AC

## 2023-07-03 MED ORDER — TIZANIDINE HCL 2 MG PO TABS: 2.0000 mg | ORAL_TABLET | Freq: Three times a day (TID) | ORAL | 0 refills | Status: AC | PRN

## 2023-07-03 MED ORDER — DEXAMETHASONE SODIUM PHOSPHATE 10 MG/ML IJ SOLN
10.0000 mg | Freq: Once | INTRAMUSCULAR | Status: AC
  Administered 2023-07-03: 10 mg via INTRAMUSCULAR
  Filled 2023-07-03: qty 1

## 2023-07-03 MED ORDER — ONDANSETRON 4 MG PO TBDP: 4.0000 mg | ORAL_TABLET | Freq: Three times a day (TID) | ORAL | 0 refills | Status: DC | PRN

## 2023-07-03 MED ORDER — OXYCODONE-ACETAMINOPHEN 5-325 MG PO TABS: 1.0000 | ORAL_TABLET | Freq: Three times a day (TID) | ORAL | 0 refills | Status: AC | PRN

## 2023-07-03 NOTE — ED Provider Notes (Signed)
 [facility] Provider Note     Event Date/Time   First MD Initiated Contact with Patient 07/03/23 1807     (approximate)   History   Back Pain   HPI  Alyssa Duncan is a 83 y.o. female with a history of arthritis, GERD, HTN, HLD, kidney stones, CAD who presents to the ED endorsing right-sided flank pain.  Patient would endorse several days of symptoms primarily on the right side.  She had followed up with her urologist believing that she may have been passing a kidney stone as in the past.  She denied any urinary retention or dysuria.  She had a urinalysis performed 2 days ago which was reassuring and a KUB which did not show any acute renal calculi.  Patient presents to the ED today, after sudden increase in her right-sided flank pain symptoms this morning at 7 AM.  The only preceding incident that the patient would raise concern about is that she lifted a large stand mixer out of a lower cabinet yesterday.  She denies any frank chest pain, shortness of breath, nausea, vomiting, diaphoresis.  She would endorse inspiratory hold, when the pain flares and causes spasms.  She applied lidocaine patches today, as well as a heat pad.  She presents via EMS from home for evaluation of her symptoms.  Prior to leaving house, the patient took a 4 mg tizanidine pill, but did not note any improvement of her symptoms, subsequently called EMS for transport.  No reports of any recent injury, trauma, fall.  No bladder bowel incontinence, foot drop, saddle anesthesia.  Physical Exam   Triage Vital Signs: ED Triage Vitals  Encounter Vitals Group     BP 07/03/23 1801 (!) 107/58     Systolic BP Percentile --      Diastolic BP Percentile --      Pulse Rate 07/03/23 1801 69     Resp 07/03/23 1801 20     Temp 07/03/23 1801 98.7 F (37.1 C)     Temp Source 07/03/23 1801 Oral     SpO2 07/03/23 1801 96 %     Weight 07/03/23 1800 189 lb 2.5 oz (85.8 kg)     Height  --      Head Circumference --      Peak Flow --      Pain Score 07/03/23 1800 9     Pain Loc --      Pain Education --      Exclude from Growth Chart --     Most recent vital signs: Vitals:   07/03/23 1801 07/03/23 1930  BP: (!) 107/58 (!) 145/57  Pulse: 69 64  Resp: 20 16  Temp: 98.7 F (37.1 C)   SpO2: 96% 96%    General Awake, no distress. NAD HEENT NCAT. PERRL. EOMI. No rhinorrhea. Mucous membranes are moist.  CV:  Good peripheral perfusion. RRR RESP:  Normal effort. CTA ABD:  No distention.  Soft and nontender.  Normoactive bowel sounds noted.  Tender to palpation to the right flank region as well as the right anterior abdominal wall.  Some local skin changes noted to the same region appearing to present as some maculopapular, erythematous skin changes in a single dermatome.  No vesicular lesions are noted. MSK:  Normal spinal alignment without midline tenderness, spasm, vomiting, or step-off.  Patient able to rotate her trunk to the left of the bed without difficulty at the time of this assessment.  ED Results / Procedures / Treatments   Labs (all labs ordered are listed, but only abnormal results are displayed) Labs Reviewed - No data to display   EKG   RADIOLOGY  I personally viewed and evaluated these images as part of my medical decision making, as well as reviewing the written report by the radiologist.  ED Provider Interpretation: No acute findings  CT Renal Stone Study Result Date: 07/03/2023 CLINICAL DATA:  Abdominal/flank pain, stone suspected. Lower back pain. EXAM: CT ABDOMEN AND PELVIS WITHOUT CONTRAST TECHNIQUE: Multidetector CT imaging of the abdomen and pelvis was performed following the standard protocol without IV contrast. RADIATION DOSE REDUCTION: This exam was performed according to the departmental dose-optimization program which includes automated exposure control, adjustment of the mA and/or kV according to patient size and/or use of  iterative reconstruction technique. COMPARISON:  Abdominal radiograph 07/02/2023 and CT abdomen pelvis 12/03/2015 FINDINGS: Lower chest: Hypoventilation changes in the lung bases. No acute abnormality. Hepatobiliary: Cholecystectomy. Pneumobilia. No acute abnormality. No biliary dilation. Pancreas: Unremarkable. Spleen: Unremarkable. Adrenals/Urinary Tract: Stable adrenal glands. Bilateral cortical renal scarring. No urinary calculi or hydronephrosis. Unremarkable bladder were not obscured by streak artifact. Stomach/Bowel: No bowel obstruction or bowel wall thickening. Stomach and appendix are within normal limits. Vascular/Lymphatic: Aortic atherosclerosis. No enlarged abdominal or pelvic lymph nodes. Reproductive: Calcified fibroids.  No acute abnormality. Other: No free intraperitoneal fluid or air. Musculoskeletal: Left THA. No acute fracture. Multilevel thoracolumbar spondylosis. Degenerative endplate changes at L2-L3 and L4-L5. Moderate to advanced lower lumbar facet arthropathy. IMPRESSION: 1. No acute abnormality in the abdomen or pelvis. 2. No urinary calculi or hydronephrosis. 3. Aortic Atherosclerosis (ICD10-I70.0). Electronically Signed   By: Minerva Fester M.D.   On: 07/03/2023 20:15     PROCEDURES:  Critical Care performed: No  Procedures   MEDICATIONS ORDERED IN ED: Medications  dexamethasone (DECADRON) injection 10 mg (10 mg Intramuscular Given 07/03/23 1914)  oxyCODONE-acetaminophen (PERCOCET/ROXICET) 5-325 MG per tablet 1 tablet (1 tablet Oral Given 07/03/23 1913)  ondansetron (ZOFRAN-ODT) disintegrating tablet 4 mg (4 mg Oral Given 07/03/23 1913)     IMPRESSION / MDM / ASSESSMENT AND PLAN / ED COURSE  I reviewed the triage vital signs and the nursing notes.                              Differential diagnosis includes, but is not limited to, flank pain, musculoskeletal etiology, nephrolithiasis, herpes zoster  Patient's presentation is most consistent with acute complicated  illness / injury requiring diagnostic workup.  Patient's diagnosis is consistent with right flank pain and lumbosacral pain likely of a musculoskeletal etiology.  Geriatric patient with reassuring dental workup at this time.  At the time of my evaluation, the patient was likely feeling the beneficial effects of the 4 mg tizanidine she taken prior to leaving the home.  She was able to move without difficulty in the bed, rotating her trunk and torso without significant difficulty.  She did have some concerning skin changes on the right side of the torso that could represent an early herpes zoster.  Advised patient to continue to monitor for any skin changes as discussed.  Her CT scan was overall reassuring not indicating any acute nephrolithiasis or hydronephrosis.  Patient also noted improvement of her symptoms after ED medication administration of Decadron IM, and Percocet.  No red flags on exam, as patient is likely stable for outpatient management.  Patient will be discharged home with  prescriptions for Zofran, oxycodone, (#9), prednisone taper, and tizanidine 2 mg (#15). Patient is to follow up with her PCP as discussed, as needed or otherwise directed. Patient is given ED precautions to return to the ED for any worsening or new symptoms.   FINAL CLINICAL IMPRESSION(S) / ED DIAGNOSES   Final diagnoses:  Acute right flank pain  Acute right-sided low back pain without sciatica     Rx / DC Orders   ED Discharge Orders          Ordered    predniSONE (DELTASONE) 10 MG tablet  Q breakfast        07/03/23 2039    oxyCODONE-acetaminophen (PERCOCET) 5-325 MG tablet  Every 8 hours PRN        07/03/23 2039    tiZANidine (ZANAFLEX) 2 MG tablet  Every 8 hours PRN        07/03/23 2039    ondansetron (ZOFRAN-ODT) 4 MG disintegrating tablet  Every 8 hours PRN        07/03/23 2056             Note:  This document was prepared using Dragon voice recognition software and may include unintentional  dictation errors.    Lissa Hoard, PA-C 07/03/23 2221    Sharman Cheek, MD 07/14/23 [phone]

## 2023-07-03 NOTE — Discharge Instructions (Signed)
 Your exam and CT scan are normal reassuring at this time.  No sign of a serious underlying abdomen or pelvic source of your flank pain.  The symptoms may be due to musculoskeletal strain.  Continue with your antispasm medication as well as the prescription pain medicine and a steroid.  Follow-up with your primary provider or return to ED if needed.

## 2023-07-03 NOTE — ED Notes (Signed)
 Urine sample sent to lab

## 2023-07-03 NOTE — Telephone Encounter (Signed)
 Patient called back and said to not worry about the ultrasound because she thinks it might be more muscle then anything else and she is seeing Dr. Apolinar Junes on 4/9

## 2023-07-03 NOTE — Addendum Note (Signed)
 Addended by: Debarah Crape on: 07/03/2023 03:01 PM   Modules accepted: Orders

## 2023-07-03 NOTE — Telephone Encounter (Deleted)
 Patient called wanting to know about the ultrasound that she discussed with [facility] yesterday in her office visit. Does she need to call them to schedule it or do we call and get it scheduled?

## 2023-07-03 NOTE — ED Triage Notes (Signed)
 C/O right thoracic back pain.  States having trouble catching breath.  Has had some bowel and bladder incontinence due to pain with movement.

## 2023-07-03 NOTE — Telephone Encounter (Signed)
RUS cancelled

## 2023-07-03 NOTE — ED Triage Notes (Signed)
 Pt comes with left lower back pain and left shoulder pain. Pt recently cleared of kidney stones. Pt states this all started few days ago. Pt did hot cold therapy with no relief. Pt states pain is just too much.

## 2023-07-15 ENCOUNTER — Ambulatory Visit: Payer: Self-pay | Admitting: Urology

## 2023-07-15 VITALS — BP 157/66 | HR 62 | Wt 185.2 lb

## 2023-07-15 DIAGNOSIS — C679 Malignant neoplasm of bladder, unspecified: Secondary | ICD-10-CM | POA: Diagnosis not present

## 2023-07-15 LAB — URINALYSIS, COMPLETE
Bilirubin, UA: NEGATIVE
Glucose, UA: NEGATIVE
Ketones, UA: NEGATIVE
Leukocytes,UA: NEGATIVE
Nitrite, UA: NEGATIVE
Protein,UA: NEGATIVE
RBC, UA: NEGATIVE
Specific Gravity, UA: 1.015 (ref 1.005–1.030)
Urobilinogen, Ur: 0.2 mg/dL (ref 0.2–1.0)
pH, UA: 6.5 (ref 5.0–7.5)

## 2023-07-15 LAB — MICROSCOPIC EXAMINATION: Bacteria, UA: NONE SEEN

## 2023-07-15 NOTE — Progress Notes (Signed)
   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   Patient was admitted on 12/03/2015 with fevers, right flank pain found to have an obstructing right distal 5 mm ureteral stone. She was taken to the operating room for cystoscopy, right ureteral stent placement in the setting of worsening leukocytosis and uncontrolled pain.  Intraoperatively, an incidental bladder tumor was identified.     She returned to the operating room on 01/02/2016 for definitive management of her stone via ureteroscopy, laser lithotripsy followed by TURBT. Surgical pathology was consistent with low-grade noninvasive bladder cancer. 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   Cysto in office on 05/01/2016 showed a very small low-grade appearing recurrence ~2 mm, anterior bladder wall in close proximity to the bladder neck. She underwent cystoscopy, fulguration of this lesion in the office shortly thereafter which was well tolerated.     Patient  had concern for possible early recurrence on 10/2017 with some subtle carpeting around the stellate scar as well as focal area of erythema.  She was taken to the operating room shortly thereafter at which time no pathology was identified.  Bilateral retrogrades were unremarkable.     She returned to the operating room on 01/05/2023 for cystoscopy, bladder biopsy and bilateral retrograde pyelogram with again notable inflammatory changes around her right hemitrigone.  Biopsy was consistent early low-grade recurrence, noninvasive.  Muscularis propria was present and not involved.  She did have significant apical and anterior vaginal prolapse.   NED. A&Ox3.   No respiratory distress   Abd soft, NT, ND Normal external genitalia with  patent urethral meatus.  Prolapse reduced just prior to cystoscopy.  Cystoscopy Procedure Note  Patient identification was confirmed, informed consent was obtained, and patient was prepped using Betadine solution.  Lidocaine jelly was administered per urethral meatus.    Procedure: - Flexible cystoscope introduced, without any difficulty.   - Thorough search of the bladder revealed:    normal urethral meatus    normal urothelium with inflammatory changes around the trigonal ridge.  There is a ridge extending on the right side linear posterior lateral from the trigone which also has inflammatory changes versus early papillary carpeting.  This however when the bladder was maximally distended and digital manipulation was used to reduce the prolapsed area, the mucosa appeared to be flat and without texture.    no stones    no ulcers       no urethral polyps       Post-Procedure: - Patient tolerated the procedure well  Assessment/ Plan:  History of bladder cancer Cystoscopy today was more reassuring.  Suspect this is more inflammatory based based on the location and with additional manipulation, the texture of the mucosa is actually relatively smooth.  As such, would recommend continuing to just follow this area for the time being.  She is agreeable this plan.  Will plan to repeat cystoscopy in about 4 months.  All questions answered.  Vanna Scotland, MD

## 2023-07-23 ENCOUNTER — Telehealth: Payer: Self-pay | Admitting: Cardiovascular Disease

## 2023-07-23 ENCOUNTER — Telehealth: Payer: Self-pay

## 2023-07-23 NOTE — Telephone Encounter (Signed)
  Patient Consent for Virtual Visit        Alyssa Duncan has provided verbal consent on 07/23/2023 for a virtual visit (video or telephone).   CONSENT FOR VIRTUAL VISIT FOR:  Alyssa Duncan  By participating in this virtual visit I agree to the following:  I hereby voluntarily request, consent and authorize [facility] and its employed or contracted physicians, physician assistants, nurse practitioners or other licensed health care professionals (the Practitioner), to provide me with telemedicine health care services (the "Services") as deemed necessary by the treating Practitioner. I acknowledge and consent to receive the Services by the Practitioner via telemedicine. I understand that the telemedicine visit will involve communicating with the Practitioner through live audiovisual communication technology and the disclosure of certain medical information by electronic transmission. I acknowledge that I have been given the opportunity to request an in-person assessment or other available alternative prior to the telemedicine visit and am voluntarily participating in the telemedicine visit.  I understand that I have the right to withhold or withdraw my consent to the use of telemedicine in the course of my care at any time, without affecting my right to future care or treatment, and that the Practitioner or I may terminate the telemedicine visit at any time. I understand that I have the right to inspect all information obtained and/or recorded in the course of the telemedicine visit and may receive copies of available information for a reasonable fee.  I understand that some of the potential risks of receiving the Services via telemedicine include:  Delay or interruption in medical evaluation due to technological equipment failure or disruption; Information transmitted may not be sufficient (e.g. poor resolution of images) to allow for appropriate medical decision making by the Practitioner;  and/or  In rare instances, security protocols could fail, causing a breach of personal health information.  Furthermore, I acknowledge that it is my responsibility to provide information about my medical history, conditions and care that is complete and accurate to the best of my ability. I acknowledge that Practitioner's advice, recommendations, and/or decision may be based on factors not within their control, such as incomplete or inaccurate data provided by me or distortions of diagnostic images or specimens that may result from electronic transmissions. I understand that the practice of medicine is not an exact science and that Practitioner makes no warranties or guarantees regarding treatment outcomes. I acknowledge that a copy of this consent can be made available to me via my patient portal [facility] MyChart), or I can request a printed copy by calling the office of [facility].    I understand that my insurance will be billed for this visit.   I have read or had this consent read to me. I understand the contents of this consent, which adequately explains the benefits and risks of the Services being provided via telemedicine.  I have been provided ample opportunity to ask questions regarding this consent and the Services and have had my questions answered to my satisfaction. I give my informed consent for the services to be provided through the use of telemedicine in my medical care

## 2023-07-23 NOTE — Telephone Encounter (Signed)
   Name: Alyssa Duncan  DOB: Aug 23, 1940  MRN: [id number]  Primary Cardiologist: Antionette Kirks, MD   Preoperative team, please contact this patient and set up a phone call appointment for further preoperative risk assessment. Please obtain consent and complete medication review. Thank you for your help.  I confirm that guidance regarding antiplatelet and oral anticoagulation therapy has been completed and, if necessary, noted below.  Patient can hold ASA 81 mg 5 to 7 days prior to procedure if necessary.  I also confirmed the patient resides in the state of [place] . As per [facility] Medical Board telemedicine laws, the patient must reside in the state in which the provider is licensed.   Francene Ing, Retha Cast, NP 07/23/2023, 11:09 AM [facility]

## 2023-07-23 NOTE — Telephone Encounter (Signed)
 Preop televisit now scheduled, med rec and consent done.

## 2023-07-23 NOTE — Telephone Encounter (Signed)
   Pre-operative Risk Assessment    Patient Name: Alyssa Duncan  DOB: June 20, 1940 MRN: [id number]   Date of last office visit: 12/30/22 Date of next office visit: 09/15/23   Request for Surgical Clearance    Procedure:  Total joint replacement for shoulder  Date of Surgery:  Clearance TBD                                Surgeon:  R. Acey Ace, MD Surgeon's Group or Practice Name:  [facility] Phone number:  [phone] Fax number:  [phone]   Type of Clearance Requested:   - Medical    Type of Anesthesia:  Not Indicated   Additional requests/questions:    Signed, Fletcher Humble   07/23/2023, 10:50 AM

## 2023-08-05 ENCOUNTER — Ambulatory Visit: Attending: Cardiovascular Disease

## 2023-08-05 DIAGNOSIS — Z01810 Encounter for preprocedural cardiovascular examination: Secondary | ICD-10-CM

## 2023-08-05 NOTE — Progress Notes (Signed)
 Virtual Visit via Telephone Note   Because of [name] co-morbid illnesses, she is at least at moderate risk for complications without adequate follow up.  This format is felt to be most appropriate for this patient at this time.  Due to technical limitations with video connection (technology), today's appointment will be conducted as an audio only telehealth visit, and Alyssa Duncan verbally agreed to proceed in this manner.   All issues noted in this document were discussed and addressed.  No physical exam could be performed with this format.  Evaluation Performed:  Preoperative cardiovascular risk assessment _____________   Date:  08/05/2023   Patient ID:  Alyssa Duncan, DOB Mar 25, 1941, MRN [id number] Patient Location:  Home Provider location:   Office  Primary Care Provider:  Melchor Spoon, MD Primary Cardiologist:  Antionette Kirks, MD  Chief Complaint / Patient Profile  83 y.o. y/o female with a h/o mild to moderate nonobstructive coronary artery disease, paroxysmal atrial fibrillation, hypertension, hyperlipidemia, left bundle branch block who is pending total joint replacement for right shoulder and presents today for telephonic preoperative cardiovascular risk assessment. History of Present Illness  Alyssa Duncan is a 83 y.o. female who presents via audio/video conferencing for a telehealth visit today.  Pt was last seen in cardiology clinic on 12/26/2022 by Dr. Alvenia Aus.  At that time Alyssa Duncan was doing well.  The patient is now pending procedure as outlined above. Since her last visit, she has remained stable from a cardiac standpoint. Today she denies chest pain, shortness of breath, lower extremity edema, fatigue, palpitations, melena, hematuria, hemoptysis, diaphoresis, weakness, presyncope, syncope, orthopnea, and PND.  Past Medical History    Past Medical History:  Diagnosis Date   Acute biliary pancreatitis    Aortic atherosclerosis (HCC)    Arthritis     Asthma    Back pain    Bladder cancer (HCC)    CAD (coronary artery disease) 02/07/2021   a.) cCTA 02/07/2021: Ca2+ 452 (76th %'ile for age/sex/race match control); 25% oRCA, 50-69% pLAD; FFRct: LAD = 0.88, LCx = 0.90, RCA = 0.89   Choledocholithiasis    Diastolic dysfunction    a.) TTE 025/10/2017: EF 55-60%, no RWMAs, G1DD, triv AR; b.) TTE 05/15/2022: EF 50-55%, mild LVH, G1DD, mild reduced RVSF, mild LA dil, mild MR/AR   Difficult intubation 05/2017   a.) macroglossia; b.) reduced cervical mobility; c.) anterior larynx   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   a.) CNB 03/15/2012 --> pathology (+) for low grade ductal and lobular carcinoma in situ; b.) s/p wide excision 04/05/2012 --> final pathology (+) for 3.6 cm (grade II)  ER/PR (+) DCIS;  b.) s/p XRT (5000 cGY) with 1600 cGY scar boost (completed 06/2012) + 5 years endocrine therapy (exemestane )   Edema leg    Family history of adverse reaction to anesthesia    a.) delayed emergence in 1st degree relative (Father)   Gallstone pancreatitis 05/13/2017   GERD (gastroesophageal reflux disease)    Hyperlipidemia    Hypertension    Hypothyroidism    Kidney stones 12/2015   LBBB (left bundle branch block)    LEFT adrenal nodule (HCC)    Lesion of bladder 12/2022   Long-term use of aspirin  therapy    Obesity    OSA on CPAP    PAF (paroxysmal atrial fibrillation) (HCC) 2017   a.) single episode in setting of urosepis related to nephrolithiasis; no recurrence.  Personal history of radiation therapy 2013   BREAST CA   Sepsis (HCC)    Shingles    Skin cancer 2005   left arm and nose   Past Surgical History:  Procedure Laterality Date   BREAST BIOPSY Left 2013   POS   BREAST BIOPSY Left 2018   benign fat necrosis   BREAST BIOPSY Left 04/24/2022   us  biopsy/heart clip/benign mammary parenchyma with dense stromal fibrosis and fibroadenomatoid changes; focal chages sugguestive of remote fat necrosis; no atypia  or malignancy   BREAST BIOPSY Left 04/24/2022   US  LT BREAST BX W LOC DEV 1ST LESION IMG BX SPEC US  GUIDE 04/24/2022 [facility]   BREAST BIOPSY Left 05/01/2022   MM LT BREAST BX W LOC DEV 1ST LESION IMAGE BX SPEC STEREO GUIDE 05/01/2022 [facility]   BREAST BIOPSY Right 04/23/2023   u/s bx 9:30 heart clip path pending   BREAST BIOPSY Right 04/23/2023   US  RT BREAST BX W LOC DEV 1ST LESION IMG BX SPEC US  GUIDE 04/23/2023 [facility]   BREAST LUMPECTOMY Left 2013   CARPAL TUNNEL RELEASE Right    CATARACT EXTRACTION Bilateral    CHOLECYSTECTOMY N/A 05/15/2017   Procedure: LAPAROSCOPIC CHOLECYSTECTOMY WITH INTRAOPERATIVE CHOLANGIOGRAM;  Surgeon: Franki Isles, MD;  Location: [facility];  Service: General;  Laterality: N/A;   COLONOSCOPY WITH PROPOFOL  N/A 05/10/2015   Procedure: COLONOSCOPY WITH PROPOFOL ;  Surgeon: Stephens Eis, MD;  Location: [facility];  Service: Gastroenterology;  Laterality: N/A;   CYSTOSCOPY W/ RETROGRADES Bilateral 01/02/2016   Procedure: CYSTOSCOPY WITH RETROGRADE PYELOGRAM;  Surgeon: Dustin Gimenez, MD;  Location: [facility];  Service: Urology;  Laterality: Bilateral;   CYSTOSCOPY W/ RETROGRADES Bilateral 11/04/2017   Procedure: CYSTOSCOPY WITH RETROGRADE PYELOGRAM;  Surgeon: Dustin Gimenez, MD;  Location: [facility];  Service: Urology;  Laterality: Bilateral;   CYSTOSCOPY W/ RETROGRADES Bilateral 01/05/2023   Procedure: CYSTOSCOPY WITH RETROGRADE PYELOGRAM;  Surgeon: Dustin Gimenez, MD;  Location: [facility];  Service: Urology;  Laterality: Bilateral;   CYSTOSCOPY W/ URETERAL STENT PLACEMENT Right 01/02/2016   Procedure: CYSTOSCOPY WITH STENT REPLACEMENT;  Surgeon: Dustin Gimenez, MD;  Location: [facility];  Service: Urology;  Laterality: Right;   CYSTOSCOPY WITH BIOPSY N/A 01/05/2023   Procedure: CYSTOSCOPY WITH BLADDER BIOPSY;  Surgeon: Dustin Gimenez, MD;  Location: [facility];  Service: Urology;  Laterality: N/A;   CYSTOSCOPY WITH STENT PLACEMENT Right  12/04/2015   Procedure: CYSTOSCOPY WITH STENT PLACEMENT;  Surgeon: Dustin Gimenez, MD;  Location: [facility];  Service: Urology;  Laterality: Right;   ENDOSCOPIC RETROGRADE CHOLANGIOPANCREATOGRAPHY (ERCP) WITH PROPOFOL  N/A 05/14/2017   Procedure: ENDOSCOPIC RETROGRADE CHOLANGIOPANCREATOGRAPHY (ERCP) WITH PROPOFOL ;  Surgeon: Marnee Sink, MD;  Location: [facility];  Service: Endoscopy;  Laterality: N/A;   KNEE ARTHROSCOPY Right    SEPTOPLASTY     TONSILLECTOMY     age 10   TOTAL HIP ARTHROPLASTY Left 2013   TRANSURETHRAL RESECTION OF BLADDER TUMOR WITH MITOMYCIN -C N/A 01/02/2016   Procedure: TRANSURETHRAL RESECTION OF BLADDER TUMOR WITH MITOMYCIN -C;  Surgeon: Dustin Gimenez, MD;  Location: [facility];  Service: Urology;  Laterality: N/A;   URETEROSCOPY WITH HOLMIUM LASER LITHOTRIPSY Right 01/02/2016   Procedure: URETEROSCOPY WITH HOLMIUM LASER LITHOTRIPSY;  Surgeon: Dustin Gimenez, MD;  Location: [facility];  Service: Urology;  Laterality: Right;   UVULOPALATOPHARYNGOPLASTY     and tongue surgery   Allergies Allergies  Allergen Reactions   Ativan  [Lorazepam ] Other (See Comments)    Hysteria    Cardura [Doxazosin] Other (See Comments)  unsure   Clonidine Derivatives Other (See Comments)    unsure   Enalapril Other (See Comments)    unsure   Levaquin [Levofloxacin] Other (See Comments)    Couldn't raise her arms   Mobic [Meloxicam] Other (See Comments)    unsure   Sulfa Antibiotics Nausea Only   Vicodin [Hydrocodone-Acetaminophen ] Other (See Comments)    unsure   Home Medications    Prior to Admission medications   Medication Sig Start Date End Date Taking? Authorizing Provider  acetaminophen  (TYLENOL ) 500 MG tablet Take 1,000 mg by mouth 2 (two) times daily.    [provider]  amLODipine  (NORVASC ) 10 MG tablet Take 10 mg by mouth at bedtime. 03/04/22   [provider]  aspirin  EC 81 MG tablet Take 1 tablet (81 mg total) by mouth daily. 02/15/16   Wenona Hamilton, MD  Calcium  Carb-Cholecalciferol 600-800 MG-UNIT TABS Take 1 tablet by mouth 2 (two) times daily.    [provider]  CRANBERRY EXTRACT PO Take by mouth.    [provider]  docusate sodium  (COLACE) 50 MG capsule Take 50 mg by mouth daily as needed.    [provider]  dorzolamide -timolol  (COSOPT ) 22.3-6.8 MG/ML ophthalmic solution Place 1 drop into both eyes 2 (two) times daily.     [provider]  furosemide  (LASIX ) 20 MG tablet Take 20 mg by mouth as needed. 10/03/14   [provider]  hydrochlorothiazide  (HYDRODIURIL ) 25 MG tablet Take 1 tablet by mouth every morning. 12/03/21   [provider]  levothyroxine  (SYNTHROID , LEVOTHROID) 75 MCG tablet Take 75 mcg by mouth daily before breakfast.    [provider]  loratadine  (CLARITIN ) 10 MG tablet Take 10 mg by mouth at bedtime.    [provider]  losartan (COZAAR) 100 MG tablet Take 100 mg by mouth daily before lunch.    [provider]  lovastatin (MEVACOR) 40 MG tablet Take 40 mg by mouth at bedtime.    [provider]  LUMIGAN 0.01 % SOLN Place 1 drop into both eyes at bedtime.  06/06/15   [provider]  metoprolol  tartrate (LOPRESSOR ) 100 MG tablet Take 100 mg by mouth 2 (two) times daily.    [provider]  ondansetron  (ZOFRAN -ODT) 4 MG disintegrating tablet Take 1 tablet (4 mg total) by mouth every 8 (eight) hours as needed for nausea or vomiting. 07/03/23   Menshew, Raye Cai, PA-C  pantoprazole  (PROTONIX ) 40 MG tablet Take 40 mg by mouth at bedtime.     [provider]  potassium chloride  SA (K-DUR,KLOR-CON ) 20 MEQ tablet Take 20 mEq by mouth 2 (two) times daily.    [provider]  Prenatal Vit-Fe Fumarate-FA (PRENATAL MULTIVITAMIN) TABS tablet Take 1 tablet by mouth daily at 12 noon.    [provider]  vitamin B-12 (CYANOCOBALAMIN) 1000 MCG tablet Take 1,000 mcg by mouth daily.     [provider]  vitamin C (ASCORBIC ACID) 500 MG tablet Take 500 mg by mouth daily.    [provider]   Physical Exam  Vital Signs:  Alyssa Duncan does not have vital signs available for review today. Given telephonic nature of communication, physical exam is limited. AAOx3. NAD. Normal affect.  Speech and respirations are unlabored. Accessory Clinical Findings   None Assessment & Plan    1.  Preoperative Cardiovascular Risk Assessment: Total joint replacement for shoulder  Ms. Pacini's perioperative risk of a major cardiac event is 0.4%  according to the Revised Cardiac Risk Index (RCRI).  Therefore, she is at low risk for perioperative complications. Her functional capacity is good at 5.62 METs according to the Duke Activity Status Index (DASI). Recommendations: According to ACC/AHA guidelines, no further cardiovascular testing needed.  The patient may proceed to surgery at acceptable risk.    The patient was advised that if she develops new symptoms prior to surgery to contact our office to arrange for a follow-up visit, and she verbalized understanding.  A copy of this note will be routed to requesting surgeon.  Time:   Today, I have spent 10 minutes with the patient with telehealth technology discussing medical history, symptoms, and management plan.    Jaquawn Saffran D Zivah Mayr, NP  08/05/2023, 4:10 PM

## 2023-09-01 ENCOUNTER — Encounter: Payer: Self-pay | Admitting: Surgery

## 2023-09-01 ENCOUNTER — Other Ambulatory Visit: Payer: Self-pay | Admitting: Surgery

## 2023-09-01 DIAGNOSIS — D241 Benign neoplasm of right breast: Secondary | ICD-10-CM

## 2023-09-15 ENCOUNTER — Encounter: Payer: Self-pay | Admitting: Cardiovascular Disease

## 2023-09-15 ENCOUNTER — Ambulatory Visit: Attending: Cardiovascular Disease | Admitting: Cardiovascular Disease

## 2023-09-15 VITALS — BP 114/60 | HR 63 | Ht 61.0 in | Wt 182.4 lb

## 2023-09-15 DIAGNOSIS — E785 Hyperlipidemia, unspecified: Secondary | ICD-10-CM | POA: Diagnosis not present

## 2023-09-15 DIAGNOSIS — I2510 Atherosclerotic heart disease of native coronary artery without angina pectoris: Secondary | ICD-10-CM

## 2023-09-15 DIAGNOSIS — I10 Essential (primary) hypertension: Secondary | ICD-10-CM | POA: Diagnosis not present

## 2023-09-15 DIAGNOSIS — I480 Paroxysmal atrial fibrillation: Secondary | ICD-10-CM | POA: Diagnosis not present

## 2023-09-15 DIAGNOSIS — I447 Left bundle-branch block, unspecified: Secondary | ICD-10-CM

## 2023-09-15 NOTE — Patient Instructions (Signed)

## 2023-09-15 NOTE — Progress Notes (Signed)
 Cardiology Office Note   Date:  09/15/2023   ID:  Alyssa Duncan, Alyssa Duncan Mar 18, 1941, MRN [id number]  PCP:  Melchor Spoon, MD  Cardiologist:   Antionette Kirks, MD   Chief Complaint  Patient presents with   Follow-up    6 month f/u no complaints today. Meds reviewed verbally with pt.      History of Present Illness: Alyssa Duncan is a 83 y.o. female who is here today for a follow-up visit regarding coronary artery disease and left bundle branch block.   She had atrial fibrillation in 2017 in the setting of sepsis and bacteremia due to obstructive urethral stones.  She had an echocardiogram done which showed normal LV systolic function, mild mitral and aortic regurgitation and normal atrial size. Pulmonary pressure was normal. She had no recurrent arrhythmia off amiodarone . She was taken off anticoagulation given that atrial fibrillation was in the setting of acute illness.  She had no recurrent atrial fibrillation since then.  Other medical problems include essential hypertension, hyperlipidemia, hypothyroidism and chronic venous insufficiency. Cardiac CTA in 2022 showed evidence of aortic atherosclerosis as well as coronary calcifications with calcium  score of 452 with moderate proximal LAD stenosis and mild ostial RCA stenosis.  CT FFR was negative.    She had an echocardiogram done in February 2024 due to left bundle branch block.  It showed an EF of 50 to 55% with mild mitral regurgitation.  She had bladder biopsy without issues.  She has been doing reasonably well from a cardiac standpoint with no chest pain or shortness of breath.  She was supposed to get right shoulder surgery in July but she decided to cancel.  She feels more limited by right hip and right knee pain.   Past Medical History:  Diagnosis Date   Acute biliary pancreatitis    Aortic atherosclerosis (HCC)    Arthritis    Asthma    Back pain    Bladder cancer (HCC)    CAD (coronary artery disease) 02/07/2021    a.) cCTA 02/07/2021: Ca2+ 452 (76th %'ile for age/sex/race match control); 25% oRCA, 50-69% pLAD; FFRct: LAD = 0.88, LCx = 0.90, RCA = 0.89   Choledocholithiasis    Diastolic dysfunction    a.) TTE 025/10/2017: EF 55-60%, no RWMAs, G1DD, triv AR; b.) TTE 05/15/2022: EF 50-55%, mild LVH, G1DD, mild reduced RVSF, mild LA dil, mild MR/AR   Difficult intubation 05/2017   a.) macroglossia; b.) reduced cervical mobility; c.) anterior larynx   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   a.) CNB 03/15/2012 --> pathology (+) for low grade ductal and lobular carcinoma in situ; b.) s/p wide excision 04/05/2012 --> final pathology (+) for 3.6 cm (grade II)  ER/PR (+) DCIS;  b.) s/p XRT (5000 cGY) with 1600 cGY scar boost (completed 06/2012) + 5 years endocrine therapy (exemestane )   Edema leg    Family history of adverse reaction to anesthesia    a.) delayed emergence in 1st degree relative (Father)   Gallstone pancreatitis 05/13/2017   GERD (gastroesophageal reflux disease)    Hyperlipidemia    Hypertension    Hypothyroidism    Kidney stones 12/2015   LBBB (left bundle branch block)    LEFT adrenal nodule (HCC)    Lesion of bladder 12/2022   Long-term use of aspirin  therapy    Obesity    OSA on CPAP    PAF (paroxysmal atrial fibrillation) (HCC) 2017   a.) single  episode in setting of urosepis related to nephrolithiasis; no recurrence.   Personal history of radiation therapy 2013   BREAST CA   Sepsis (HCC)    Shingles    Skin cancer 2005   left arm and nose    Past Surgical History:  Procedure Laterality Date   BREAST BIOPSY Left 2013   POS   BREAST BIOPSY Left 2018   benign fat necrosis   BREAST BIOPSY Left 04/24/2022   us  biopsy/heart clip/benign mammary parenchyma with dense stromal fibrosis and fibroadenomatoid changes; focal chages sugguestive of remote fat necrosis; no atypia or malignancy   BREAST BIOPSY Left 04/24/2022   US  LT BREAST BX W LOC DEV 1ST LESION  IMG BX SPEC US  GUIDE 04/24/2022 [facility]   BREAST BIOPSY Left 05/01/2022   MM LT BREAST BX W LOC DEV 1ST LESION IMAGE BX SPEC STEREO GUIDE 05/01/2022 [facility]   BREAST BIOPSY Right 04/23/2023   u/s bx 9:30 heart clip path pending   BREAST BIOPSY Right 04/23/2023   US  RT BREAST BX W LOC DEV 1ST LESION IMG BX SPEC US  GUIDE 04/23/2023 [facility]   BREAST LUMPECTOMY Left 2013   CARPAL TUNNEL RELEASE Right    CATARACT EXTRACTION Bilateral    CHOLECYSTECTOMY N/A 05/15/2017   Procedure: LAPAROSCOPIC CHOLECYSTECTOMY WITH INTRAOPERATIVE CHOLANGIOGRAM;  Surgeon: Franki Isles, MD;  Location: [facility];  Service: General;  Laterality: N/A;   COLONOSCOPY WITH PROPOFOL  N/A 05/10/2015   Procedure: COLONOSCOPY WITH PROPOFOL ;  Surgeon: Stephens Eis, MD;  Location: [facility];  Service: Gastroenterology;  Laterality: N/A;   CYSTOSCOPY W/ RETROGRADES Bilateral 01/02/2016   Procedure: CYSTOSCOPY WITH RETROGRADE PYELOGRAM;  Surgeon: Dustin Gimenez, MD;  Location: [facility];  Service: Urology;  Laterality: Bilateral;   CYSTOSCOPY W/ RETROGRADES Bilateral 11/04/2017   Procedure: CYSTOSCOPY WITH RETROGRADE PYELOGRAM;  Surgeon: Dustin Gimenez, MD;  Location: [facility];  Service: Urology;  Laterality: Bilateral;   CYSTOSCOPY W/ RETROGRADES Bilateral 01/05/2023   Procedure: CYSTOSCOPY WITH RETROGRADE PYELOGRAM;  Surgeon: Dustin Gimenez, MD;  Location: [facility];  Service: Urology;  Laterality: Bilateral;   CYSTOSCOPY W/ URETERAL STENT PLACEMENT Right 01/02/2016   Procedure: CYSTOSCOPY WITH STENT REPLACEMENT;  Surgeon: Dustin Gimenez, MD;  Location: [facility];  Service: Urology;  Laterality: Right;   CYSTOSCOPY WITH BIOPSY N/A 01/05/2023   Procedure: CYSTOSCOPY WITH BLADDER BIOPSY;  Surgeon: Dustin Gimenez, MD;  Location: [facility];  Service: Urology;  Laterality: N/A;   CYSTOSCOPY WITH STENT PLACEMENT Right 12/04/2015   Procedure: CYSTOSCOPY WITH STENT PLACEMENT;  Surgeon: Dustin Gimenez, MD;   Location: [facility];  Service: Urology;  Laterality: Right;   ENDOSCOPIC RETROGRADE CHOLANGIOPANCREATOGRAPHY (ERCP) WITH PROPOFOL  N/A 05/14/2017   Procedure: ENDOSCOPIC RETROGRADE CHOLANGIOPANCREATOGRAPHY (ERCP) WITH PROPOFOL ;  Surgeon: Marnee Sink, MD;  Location: [facility];  Service: Endoscopy;  Laterality: N/A;   KNEE ARTHROSCOPY Right    SEPTOPLASTY     TONSILLECTOMY     age 26   TOTAL HIP ARTHROPLASTY Left 2013   TRANSURETHRAL RESECTION OF BLADDER TUMOR WITH MITOMYCIN -C N/A 01/02/2016   Procedure: TRANSURETHRAL RESECTION OF BLADDER TUMOR WITH MITOMYCIN -C;  Surgeon: Dustin Gimenez, MD;  Location: [facility];  Service: Urology;  Laterality: N/A;   URETEROSCOPY WITH HOLMIUM LASER LITHOTRIPSY Right 01/02/2016   Procedure: URETEROSCOPY WITH HOLMIUM LASER LITHOTRIPSY;  Surgeon: Dustin Gimenez, MD;  Location: [facility];  Service: Urology;  Laterality: Right;   UVULOPALATOPHARYNGOPLASTY     and tongue surgery     Current Outpatient Medications  Medication Sig Dispense Refill   acetaminophen  (  TYLENOL ) 500 MG tablet Take 1,000 mg by mouth 2 (two) times daily.     amLODipine  (NORVASC ) 10 MG tablet Take 10 mg by mouth at bedtime.     aspirin  EC 81 MG tablet Take 1 tablet (81 mg total) by mouth daily. 90 tablet 3   Calcium  Carb-Cholecalciferol 600-800 MG-UNIT TABS Take 1 tablet by mouth 2 (two) times daily.     CRANBERRY EXTRACT PO Take by mouth.     docusate sodium  (COLACE) 50 MG capsule Take 50 mg by mouth daily as needed.     dorzolamide -timolol  (COSOPT ) 22.3-6.8 MG/ML ophthalmic solution Place 1 drop into both eyes 2 (two) times daily.      furosemide  (LASIX ) 20 MG tablet Take 20 mg by mouth as needed.     hydrochlorothiazide  (HYDRODIURIL ) 25 MG tablet Take 1 tablet by mouth every morning.     levothyroxine  (SYNTHROID , LEVOTHROID) 75 MCG tablet Take 75 mcg by mouth daily before breakfast.     loratadine  (CLARITIN ) 10 MG tablet Take 10 mg by mouth at bedtime.     losartan (COZAAR) 100 MG  tablet Take 100 mg by mouth daily before lunch.     lovastatin (MEVACOR) 40 MG tablet Take 40 mg by mouth at bedtime.     LUMIGAN 0.01 % SOLN Place 1 drop into both eyes at bedtime.      metoprolol  tartrate (LOPRESSOR ) 100 MG tablet Take 100 mg by mouth 2 (two) times daily.     pantoprazole  (PROTONIX ) 40 MG tablet Take 40 mg by mouth at bedtime.      potassium chloride  SA (K-DUR,KLOR-CON ) 20 MEQ tablet Take 20 mEq by mouth 2 (two) times daily.     Prenatal Vit-Fe Fumarate-FA (PRENATAL MULTIVITAMIN) TABS tablet Take 1 tablet by mouth daily at 12 noon.     vitamin B-12 (CYANOCOBALAMIN) 1000 MCG tablet Take 1,000 mcg by mouth daily.     vitamin C (ASCORBIC ACID) 500 MG tablet Take 500 mg by mouth daily.     No current facility-administered medications for this visit.    Allergies:   Ativan  [lorazepam ], Cardura [doxazosin], Clonidine derivatives, Enalapril, Levaquin [levofloxacin], Mobic [meloxicam], Sulfa antibiotics, and Vicodin [hydrocodone-acetaminophen ]    Social History:  The patient  reports that she has never smoked. She has never used smokeless tobacco. She reports that she does not drink alcohol and does not use drugs.   Family History:  The patient's family history includes Bladder Cancer in her father; Breast cancer (age of onset: 58) in her daughter; Breast cancer (age of onset: 67) in her mother; Esophageal cancer (age of onset: 31) in her sister; Heart attack in her father; Heart disease in her daughter, father, and son; Kidney Stones in her brother; Melanoma in her daughter; Prostate cancer in her brother; Skin cancer in her brother, daughter, daughter, and sister.    ROS:  Please see the history of present illness.   Otherwise, review of systems are positive for none.   All other systems are reviewed and negative.    PHYSICAL EXAM: VS:  BP 114/60 (BP Location: Left Arm, Patient Position: Sitting, Cuff Size: Large)   Pulse 63   Ht 5\' 1"  (1.549 m)   Wt 182 lb 6 oz (82.7 kg)    SpO2 98%   BMI 34.46 kg/m  , BMI Body mass index is 34.46 kg/m. GEN: Well nourished, well developed, in no acute distress  HEENT: normal  Neck: no JVD, carotid bruits, or masses Cardiac: RRR; no murmurs, rubs,  or gallops,no edema  Respiratory:  clear to auscultation bilaterally, normal work of breathing GI: soft, nontender, nondistended, + BS MS: no deformity or atrophy  Skin: warm and dry, no rash Neuro:  Strength and sensation are intact Psych: euthymic mood, full affect   EKG:  EKG is ordered today. The ekg ordered today demonstrates: Normal sinus rhythm Left axis deviation Left bundle branch block When compared with ECG of 30-Dec-2022 16:12, No significant change was found   Recent Labs: 12/29/2022: BUN 20; Creatinine, Ser 0.96; Potassium 3.3; Sodium 142    Lipid Panel No results found for: "CHOL", "TRIG", "HDL", "CHOLHDL", "VLDL", "LDLCALC", "LDLDIRECT"    Wt Readings from Last 3 Encounters:  09/15/23 182 lb 6 oz (82.7 kg)  07/15/23 185 lb 3.2 oz (84 kg)  07/03/23 189 lb 2.5 oz (85.8 kg)           No data to display            ASSESSMENT AND PLAN:  1.  Coronary artery disease involving native coronary arteries without angina: The patient is known to have mild to moderate nonobstructive coronary artery disease.  Currently with no anginal symptoms.  Continue low-dose aspirin  and treatment of risk factors.    2. Paroxysmal atrial fibrillation: The patient had one episode of atrial fibrillation in the setting of sepsis and respiratory distress  In 2017.  No evidence of recurrent arrhythmia since then.  No need for long-term anticoagulation unless she develops recurrent atrial fibrillation.  3.  Essential hypertension: She has known whitecoat syndrome.  Her blood pressure is well-controlled today.  4.  Hyperlipidemia: I reviewed most recent lipid profile done in January which showed an LDL of 33.  Continue treatment with lovastatin.  5.  Left bundle branch  block: This is stable.  Echocardiogram in 2024 showed an EF of 50 to 55%.    Disposition:   FU in 12 months.  Signed,  Antionette Kirks, MD  09/15/2023 2:20 PM    [facility]

## 2023-10-22 ENCOUNTER — Ambulatory Visit
Admission: RE | Admit: 2023-10-22 | Discharge: 2023-10-22 | Disposition: A | Discharge status: Home / Self Care | Source: Ambulatory Visit | Attending: Surgery | Admitting: Surgery

## 2023-10-22 ENCOUNTER — Ambulatory Visit
Admission: RE | Admit: 2023-10-22 | Discharge: 2023-10-22 | Disposition: A | Discharge status: Home / Self Care | Source: Ambulatory Visit | Attending: Surgery

## 2023-10-22 DIAGNOSIS — D241 Benign neoplasm of right breast: Secondary | ICD-10-CM

## 2023-10-24 ENCOUNTER — Encounter: Payer: Self-pay | Admitting: Oncology

## 2023-11-11 ENCOUNTER — Encounter: Payer: Self-pay | Admitting: Urology

## 2023-11-11 ENCOUNTER — Ambulatory Visit: Admitting: Urology

## 2023-11-11 ENCOUNTER — Other Ambulatory Visit: Admitting: Urology

## 2023-11-11 VITALS — BP 146/70 | HR 74

## 2023-11-11 DIAGNOSIS — N814 Uterovaginal prolapse, unspecified: Secondary | ICD-10-CM | POA: Diagnosis not present

## 2023-11-11 DIAGNOSIS — Z8551 Personal history of malignant neoplasm of bladder: Secondary | ICD-10-CM

## 2023-11-11 LAB — URINALYSIS, COMPLETE
Bilirubin, UA: NEGATIVE
Glucose, UA: NEGATIVE
Ketones, UA: NEGATIVE
Nitrite, UA: NEGATIVE
Protein,UA: NEGATIVE
RBC, UA: NEGATIVE
Specific Gravity, UA: 1.01 (ref 1.005–1.030)
Urobilinogen, Ur: 0.2 mg/dL (ref 0.2–1.0)
pH, UA: 7 (ref 5.0–7.5)

## 2023-11-11 LAB — MICROSCOPIC EXAMINATION

## 2023-11-11 NOTE — Progress Notes (Signed)
   11/11/23  CC:  Chief Complaint  Patient presents with   Cysto    HPI: Refer to Dr. Bjorn previous note 07/15/2023.  No problems since her last visit  Blood pressure (!) 146/70, pulse 74. NED. A&Ox3.   Prolapse reduced with 1/2 speculum  Cystoscopy Procedure Note  Patient identification was confirmed, informed consent was obtained, and patient was prepped using Betadine solution.  Lidocaine  jelly was administered per urethral meatus.    Procedure: - Flexible cystoscope introduced, without any difficulty.   - Thorough search of the bladder revealed:    normal urethral meatus    normal urothelium    no stones    no ulcers     no tumors    no urethral polyps    Mild trabeculation  - Ureteral orifices were normal in position and appearance.  Post-Procedure: - Patient tolerated the procedure well  Assessment/ Plan: No evidence recurrent tumor Follow-up surveillance cystoscopy 6 months She inquired if any treatment would be available for her prolapse.  Discussed gynecology referral for evaluation/consideration of a pessary    Glendia JAYSON Barba, MD

## 2024-01-18 ENCOUNTER — Encounter: Payer: Self-pay | Admitting: Urology

## 2024-02-04 ENCOUNTER — Other Ambulatory Visit: Payer: Self-pay | Admitting: Surgery

## 2024-02-04 ENCOUNTER — Encounter: Payer: Self-pay | Admitting: Surgery

## 2024-02-04 DIAGNOSIS — D249 Benign neoplasm of unspecified breast: Secondary | ICD-10-CM

## 2024-04-05 ENCOUNTER — Ambulatory Visit
Admission: RE | Admit: 2024-04-05 | Discharge: 2024-04-05 | Disposition: A | Discharge status: Home / Self Care | Source: Ambulatory Visit | Attending: Surgery | Admitting: Surgery

## 2024-04-05 DIAGNOSIS — D241 Benign neoplasm of right breast: Secondary | ICD-10-CM | POA: Diagnosis not present

## 2024-04-05 DIAGNOSIS — D249 Benign neoplasm of unspecified breast: Secondary | ICD-10-CM | POA: Diagnosis present

## 2024-04-05 DIAGNOSIS — Z923 Personal history of irradiation: Secondary | ICD-10-CM | POA: Diagnosis not present

## 2024-04-05 DIAGNOSIS — Z9889 Other specified postprocedural states: Secondary | ICD-10-CM | POA: Diagnosis not present

## 2024-04-06 ENCOUNTER — Inpatient Hospital Stay: Payer: Medicare PPO | Attending: Oncology | Admitting: Oncology

## 2024-04-06 ENCOUNTER — Encounter: Payer: Self-pay | Admitting: Oncology

## 2024-04-06 VITALS — BP 137/53 | HR 70 | Temp 97.8°F | Resp 18 | Wt 179.2 lb

## 2024-04-06 DIAGNOSIS — Z808 Family history of malignant neoplasm of other organs or systems: Secondary | ICD-10-CM | POA: Insufficient documentation

## 2024-04-06 DIAGNOSIS — Z79811 Long term (current) use of aromatase inhibitors: Secondary | ICD-10-CM | POA: Insufficient documentation

## 2024-04-06 DIAGNOSIS — Z7982 Long term (current) use of aspirin: Secondary | ICD-10-CM | POA: Insufficient documentation

## 2024-04-06 DIAGNOSIS — Z1721 Progesterone receptor positive status: Secondary | ICD-10-CM | POA: Insufficient documentation

## 2024-04-06 DIAGNOSIS — Z85828 Personal history of other malignant neoplasm of skin: Secondary | ICD-10-CM | POA: Diagnosis not present

## 2024-04-06 DIAGNOSIS — Z803 Family history of malignant neoplasm of breast: Secondary | ICD-10-CM | POA: Diagnosis not present

## 2024-04-06 DIAGNOSIS — D0512 Intraductal carcinoma in situ of left breast: Secondary | ICD-10-CM | POA: Diagnosis not present

## 2024-04-06 DIAGNOSIS — Z923 Personal history of irradiation: Secondary | ICD-10-CM | POA: Diagnosis not present

## 2024-04-06 DIAGNOSIS — Z8551 Personal history of malignant neoplasm of bladder: Secondary | ICD-10-CM | POA: Insufficient documentation

## 2024-04-06 DIAGNOSIS — Z8052 Family history of malignant neoplasm of bladder: Secondary | ICD-10-CM | POA: Insufficient documentation

## 2024-04-06 DIAGNOSIS — Z800 Family history of malignant neoplasm of digestive organs: Secondary | ICD-10-CM | POA: Diagnosis not present

## 2024-04-06 DIAGNOSIS — Z170 Estrogen receptor positive status [ER+]: Secondary | ICD-10-CM | POA: Diagnosis not present

## 2024-04-06 DIAGNOSIS — Z79899 Other long term (current) drug therapy: Secondary | ICD-10-CM | POA: Diagnosis not present

## 2024-04-06 NOTE — Assessment & Plan Note (Signed)
#  Left breast DCIS, patient is doing very well clinically. She has finished 5+ years of antiestrogen treatments. Dec 2024 mammogram results are pending.  Continue annual mammogram surveillance- ordered through PCP's office.   Patient prefers to continue follow-up annually with oncology clinic.

## 2024-04-07 NOTE — Progress Notes (Signed)
 Hematology/Oncology Progress note [phone] [phone]     ASSESSMENT & PLAN:   Ductal carcinoma in situ (DCIS) of left breast #Left breast DCIS, patient is doing very well clinically. She has finished 5+ years of antiestrogen treatments. Dec 2024 mammogram results are pending.  Continue annual mammogram surveillance- ordered through surgeon/PCP's office.   Patient prefers to continue follow-up annually with oncology clinic.  Follow up in 1 year All questions were answered. The patient knows to call the clinic with any problems, questions or concerns.  Zelphia Cap, MD, PhD [facility] Hematology Oncology 04/06/2024    Chief Complaint: Alyssa Duncan is a 84 y.o. female with left breast DCIS who is seen for follow up   PERTINENT ONCOLOGY HISTORY Alyssa Duncan is a 84 y.o.afemale previously followed up with Dr. Rudell.  She switched care to me on 12/17/2018. 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  Pathology revealed a 3.6 cm area of grade II ductal carcinoma in situ. Margins were clear but close at 1 mm.  DCIS was ER + (> 90%), PR + (> 90%).  Pathologic stage was TisNx.  Status post adjuvant radiation which was completed in 06/2012.    Antiestrogen treatments : she was initially treated with Femara, but discontinued secondary to joint pains.  She tried tamoxifen, but discontinued secondary to swelling.  She began Aromasin  since 11/16/2012.  Stopped Aromasin  in September 2019 after finished total of 5 years of antiestrogen treatments.  Bilateral diagnostic mammogram on 03/24/2016 revealed no evidence of malignancy.  Diagnostic left mammogram and ultrasound on 06/09/2016 revealed an irregular hypoechogenicity in the left breast at 6 o'clock 2 cm from the nipple measuring 9 x 7 x 10 mm. This was felt likely to represent post treatment change but underlying malignancy could not be excluded.   Ultrasound of the left axilla revealed no enlarged adenopathy.   Ultrasound guided biopsy on 06/19/2016 revealed changes c/w organizing fat necrosis.  Bilateral diagnostic mammogram on 03/13/2017 that revealed a suspicious 6 mm mass in the superficial upper outer left breast.  Biopsy on 03/24/2017 results revealed fat necrosis with dystrophic calcification.   Bone density on 08/22/2015 was normal with a T-score of -1.0 in the left femur.  Bone density on 08/25/2017 was normal with a T-score of -0.8 in the femoral neck.  #, 2017, history of non-muscle invasive bladder cancer.  She underwent TURBT on 01/02/2016.  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  Cystoscopy on 05/01/2016 revealed a very small low-grade appearing recurrence.  She underwent cystoscopy and fulguration on 05/06/2016.  Cystoscopy on 04/29/2017 revealed no evidence of disease.  Next cystoscopy planned for 07/28/2017.  # She has stopped Arimidex  per Dr. Isaiah recommendation after being seen in September 2020  INTERVAL HISTORY Alyssa Duncan is a 84 y.o. female who has above history reviewed by me today presents for follow up visit for management of history of DCIS Problems and complaints are listed below:   Patient reports feeling well.  She denies any new breast concerns.  She has chronic intermittent soreness of the left breast lumpectomy sites.  No new complaints.    Past Medical History:  Diagnosis Date   Acute biliary pancreatitis    Aortic atherosclerosis    Arthritis    Asthma    Back pain    Bladder cancer (HCC)    CAD (coronary artery disease) 02/07/2021   a.) cCTA 02/07/2021: Ca2+  452 (76th %'ile for age/sex/race match control); 25% oRCA, 50-69% pLAD; FFRct: LAD = 0.88, LCx = 0.90, RCA = 0.89   Choledocholithiasis    Diastolic dysfunction    a.) TTE 025/10/2017: EF 55-60%, no RWMAs, G1DD, triv AR; b.) TTE 05/15/2022: EF 50-55%, mild LVH, G1DD, mild reduced RVSF, mild LA  dil, mild MR/AR   Difficult intubation 05/2017   a.) macroglossia; b.) reduced cervical mobility; c.) anterior larynx   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   a.) CNB 03/15/2012 --> pathology (+) for low grade ductal and lobular carcinoma in situ; b.) s/p wide excision 04/05/2012 --> final pathology (+) for 3.6 cm (grade II)  ER/PR (+) DCIS;  b.) s/p XRT (5000 cGY) with 1600 cGY scar boost (completed 06/2012) + 5 years endocrine therapy (exemestane )   Edema leg    Family history of adverse reaction to anesthesia    a.) delayed emergence in 1st degree relative (Father)   Gallstone pancreatitis 05/13/2017   GERD (gastroesophageal reflux disease)    Hyperlipidemia    Hypertension    Hypothyroidism    Kidney stones 12/2015   LBBB (left bundle branch block)    LEFT adrenal nodule (HCC)    Lesion of bladder 12/2022   Long-term use of aspirin  therapy    Obesity    OSA on CPAP    PAF (paroxysmal atrial fibrillation) (HCC) 2017   a.) single episode in setting of urosepis related to nephrolithiasis; no recurrence.   Personal history of radiation therapy 2013   BREAST CA   Sepsis (HCC)    Shingles    Skin cancer 2005   left arm and nose    Past Surgical History:  Procedure Laterality Date   BREAST BIOPSY Left 2013   POS   BREAST BIOPSY Left 2018   benign fat necrosis   BREAST BIOPSY Left 04/24/2022   us  biopsy/heart clip/benign mammary parenchyma with dense stromal fibrosis and fibroadenomatoid changes; focal chages sugguestive of remote fat necrosis; no atypia or malignancy   BREAST BIOPSY Left 04/24/2022   US  LT BREAST BX W LOC DEV 1ST LESION IMG BX SPEC US  GUIDE 04/24/2022 [facility]   BREAST BIOPSY Left 05/01/2022   MM LT BREAST BX W LOC DEV 1ST LESION IMAGE BX SPEC STEREO GUIDE 05/01/2022 [facility]   BREAST BIOPSY Right 04/23/2023   u/s bx 9:30 heart clip path pending   BREAST BIOPSY Right 04/23/2023   US  RT BREAST BX W LOC DEV 1ST LESION IMG BX  SPEC US  GUIDE 04/23/2023 [facility]   BREAST LUMPECTOMY Left 2013   CARPAL TUNNEL RELEASE Right    CATARACT EXTRACTION Bilateral    CHOLECYSTECTOMY N/A 05/15/2017   Procedure: LAPAROSCOPIC CHOLECYSTECTOMY WITH INTRAOPERATIVE CHOLANGIOGRAM;  Surgeon: Nicholaus Selinda Birmingham, MD;  Location: [facility];  Service: General;  Laterality: N/A;   COLONOSCOPY WITH PROPOFOL  N/A 05/10/2015   Procedure: COLONOSCOPY WITH PROPOFOL ;  Surgeon: Deward CINDERELLA Piedmont, MD;  Location: [facility];  Service: Gastroenterology;  Laterality: N/A;   CYSTOSCOPY W/ RETROGRADES Bilateral 01/02/2016   Procedure: CYSTOSCOPY WITH RETROGRADE PYELOGRAM;  Surgeon: Rosina Riis, MD;  Location: [facility];  Service: Urology;  Laterality: Bilateral;   CYSTOSCOPY W/ RETROGRADES Bilateral 11/04/2017   Procedure: CYSTOSCOPY WITH RETROGRADE PYELOGRAM;  Surgeon: Riis Rosina, MD;  Location: [facility];  Service: Urology;  Laterality: Bilateral;   CYSTOSCOPY W/ RETROGRADES Bilateral 01/05/2023   Procedure: CYSTOSCOPY WITH RETROGRADE PYELOGRAM;  Surgeon: Riis Rosina, MD;  Location: [facility];  Service: Urology;  Laterality: Bilateral;  CYSTOSCOPY W/ URETERAL STENT PLACEMENT Right 01/02/2016   Procedure: CYSTOSCOPY WITH STENT REPLACEMENT;  Surgeon: Rosina Riis, MD;  Location: [facility];  Service: Urology;  Laterality: Right;   CYSTOSCOPY WITH BIOPSY N/A 01/05/2023   Procedure: CYSTOSCOPY WITH BLADDER BIOPSY;  Surgeon: Riis Rosina, MD;  Location: [facility];  Service: Urology;  Laterality: N/A;   CYSTOSCOPY WITH STENT PLACEMENT Right 12/04/2015   Procedure: CYSTOSCOPY WITH STENT PLACEMENT;  Surgeon: Rosina Riis, MD;  Location: [facility];  Service: Urology;  Laterality: Right;   ENDOSCOPIC RETROGRADE CHOLANGIOPANCREATOGRAPHY (ERCP) WITH PROPOFOL  N/A 05/14/2017   Procedure: ENDOSCOPIC RETROGRADE CHOLANGIOPANCREATOGRAPHY (ERCP) WITH PROPOFOL ;  Surgeon: Jinny Carmine, MD;  Location: [facility];  Service: Endoscopy;  Laterality: N/A;   KNEE  ARTHROSCOPY Right    SEPTOPLASTY     TONSILLECTOMY     age 71   TOTAL HIP ARTHROPLASTY Left 2013   TRANSURETHRAL RESECTION OF BLADDER TUMOR WITH MITOMYCIN -C N/A 01/02/2016   Procedure: TRANSURETHRAL RESECTION OF BLADDER TUMOR WITH MITOMYCIN -C;  Surgeon: Rosina Riis, MD;  Location: [facility];  Service: Urology;  Laterality: N/A;   URETEROSCOPY WITH HOLMIUM LASER LITHOTRIPSY Right 01/02/2016   Procedure: URETEROSCOPY WITH HOLMIUM LASER LITHOTRIPSY;  Surgeon: Rosina Riis, MD;  Location: [facility];  Service: Urology;  Laterality: Right;   UVULOPALATOPHARYNGOPLASTY     and tongue surgery    Family History  Problem Relation Age of Onset   Breast cancer Mother 55   Heart disease Father    Heart attack Father    Bladder Cancer Father    Esophageal cancer Sister 41   Skin cancer Sister    Kidney Stones Brother    Skin cancer Brother        worked on farm   Prostate cancer Brother        dx late 25s   Skin cancer Daughter    Breast cancer Daughter 37   Skin cancer Daughter    Melanoma Daughter    Heart disease Daughter    Heart disease Son     Social History:  reports that she has never smoked. She has never used smokeless tobacco. She reports that she does not drink alcohol and does not use drugs.  The patient is alone today.  Allergies:  Allergies  Allergen Reactions   Amiodarone  Cough   Ativan  [Lorazepam ] Other (See Comments)    Hysteria    Cardura [Doxazosin] Other (See Comments)    unsure   Clonidine And Derivatives Other (See Comments)    unsure   Enalapril Other (See Comments)    unsure   Levaquin [Levofloxacin] Other (See Comments)    Couldn't raise her arms   Mobic [Meloxicam] Other (See Comments)    unsure   Sulfa Antibiotics Nausea Only   Vicodin [Hydrocodone-Acetaminophen ] Other (See Comments)    unsure    Current Medications: Current Outpatient Medications  Medication Sig Dispense Refill   acetaminophen  (TYLENOL ) 500 MG tablet Take 1,000 mg by mouth 2  (two) times daily.     amLODipine  (NORVASC ) 10 MG tablet Take 10 mg by mouth at bedtime.     aspirin  EC 81 MG tablet Take 1 tablet (81 mg total) by mouth daily. 90 tablet 3   Calcium  Carb-Cholecalciferol 600-800 MG-UNIT TABS Take 1 tablet by mouth 2 (two) times daily.     CRANBERRY EXTRACT PO Take by mouth.     docusate sodium  (COLACE) 50 MG capsule Take 50 mg by mouth daily as needed.     dorzolamide -timolol  (COSOPT ) 22.3-6.8 MG/ML  ophthalmic solution Place 1 drop into both eyes 2 (two) times daily.      furosemide  (LASIX ) 20 MG tablet Take 20 mg by mouth as needed.     hydrochlorothiazide  (HYDRODIURIL ) 25 MG tablet Take 1 tablet by mouth every morning.     levothyroxine  (SYNTHROID , LEVOTHROID) 75 MCG tablet Take 75 mcg by mouth daily before breakfast.     loratadine  (CLARITIN ) 10 MG tablet Take 10 mg by mouth at bedtime.     losartan (COZAAR) 100 MG tablet Take 100 mg by mouth daily before lunch.     lovastatin (MEVACOR) 40 MG tablet Take 40 mg by mouth at bedtime.     LUMIGAN 0.01 % SOLN Place 1 drop into both eyes at bedtime.      metoprolol  tartrate (LOPRESSOR ) 100 MG tablet Take 100 mg by mouth 2 (two) times daily.     naproxen (NAPROSYN) 250 MG tablet Take by mouth.     pantoprazole  (PROTONIX ) 40 MG tablet Take 40 mg by mouth at bedtime.      potassium chloride  SA (K-DUR,KLOR-CON ) 20 MEQ tablet Take 20 mEq by mouth 2 (two) times daily.     Prenatal Vit-Fe Fumarate-FA (PRENATAL MULTIVITAMIN) TABS tablet Take 1 tablet by mouth daily at 12 noon.     vitamin B-12 (CYANOCOBALAMIN) 1000 MCG tablet Take 1,000 mcg by mouth daily.     vitamin C (ASCORBIC ACID) 500 MG tablet Take 500 mg by mouth daily.     No current facility-administered medications for this visit.   Review of Systems  Constitutional:  Negative for appetite change, chills, fatigue and fever.  HENT:   Negative for hearing loss and voice change.   Eyes:  Negative for eye problems.  Respiratory:  Negative for chest tightness  and cough.   Cardiovascular:  Negative for chest pain.  Gastrointestinal:  Negative for abdominal distention, abdominal pain and blood in stool.  Endocrine: Negative for hot flashes.  Genitourinary:  Negative for difficulty urinating and frequency.   Skin:  Negative for itching and rash.  Neurological:  Negative for extremity weakness.  Hematological:  Negative for adenopathy.  Psychiatric/Behavioral:  Negative for confusion.      Physical Exam: Blood pressure (!) 137/53, pulse 70, temperature 97.8 F (36.6 C), temperature source Tympanic, resp. rate 18, weight 179 lb 3.2 oz (81.3 kg), SpO2 96%.  Physical Exam Constitutional:      General: She is not in acute distress.    Appearance: She is not diaphoretic.     Comments: Patient walks independently  HENT:     Head: Normocephalic and atraumatic.     Nose: Nose normal.     Mouth/Throat:     Pharynx: No oropharyngeal exudate.  Eyes:     General: No scleral icterus.    Pupils: Pupils are equal, round, and reactive to light.  Cardiovascular:     Rate and Rhythm: Normal rate and regular rhythm.     Heart sounds: No murmur heard. Pulmonary:     Effort: Pulmonary effort is normal. No respiratory distress.     Breath sounds: No rales.  Chest:     Chest wall: No tenderness.  Abdominal:     General: There is no distension.     Palpations: Abdomen is soft.     Tenderness: There is no abdominal tenderness.  Musculoskeletal:        General: Normal range of motion.     Cervical back: Normal range of motion and neck supple.  Skin:  General: Skin is warm and dry.     Findings: No erythema.  Neurological:     Mental Status: She is alert and oriented to person, place, and time.     Cranial Nerves: No cranial nerve deficit.     Motor: No abnormal muscle tone.     Coordination: Coordination normal.  Psychiatric:        Mood and Affect: Affect normal.    Breast exam was performed in seated and lying down position. Patient is status  post left lumpectomy with a well-healed surgical scar.  Left breast post-operative changes between 6 o'clock and 6:30 position.  Left nipple indentation with scar.  Right breast no palpable mass. Chronically retracted nipple. No palpable masses or axillary adenopathy bilaterally.    No visits with results within 3 Day(s) from this visit.  Latest known visit with results is:  Procedure visit on 11/11/2023  Component Date Value Ref Range Status   Specific Gravity, UA 11/11/2023 1.010  1.005 - 1.030 Final   pH, UA 11/11/2023 7.0  5.0 - 7.5 Final   Color, UA 11/11/2023 Yellow  Yellow Final   Appearance Ur 11/11/2023 Clear  Clear Final   Leukocytes,UA 11/11/2023 1+ (A)  Negative Final   Protein,UA 11/11/2023 Negative  Negative/Trace Final   Glucose, UA 11/11/2023 Negative  Negative Final   Ketones, UA 11/11/2023 Negative  Negative Final   RBC, UA 11/11/2023 Negative  Negative Final   Bilirubin, UA 11/11/2023 Negative  Negative Final   Urobilinogen, Ur 11/11/2023 0.2  0.2 - 1.0 mg/dL Final   Nitrite, UA 91/93/7974 Negative  Negative Final   Microscopic Examination 11/11/2023 See below:   Final   Microscopic was indicated and was performed.   WBC, UA 11/11/2023 6-10 (A)  0 - 5 /hpf Final   RBC, Urine 11/11/2023 0-2  0 - 2 /hpf Final   Epithelial Cells (non renal) 11/11/2023 0-10  0 - 10 /hpf Final   Casts 11/11/2023 Present (A)  None seen /lpf Final   Cast Type 11/11/2023 Hyaline casts  N/A Final   Bacteria, UA 11/11/2023 Many (A)  None seen/Few Final

## 2024-05-11 ENCOUNTER — Other Ambulatory Visit: Admitting: Urology

## 2024-05-18 ENCOUNTER — Other Ambulatory Visit: Admitting: Urology

## 2025-04-12 ENCOUNTER — Inpatient Hospital Stay: Admitting: Oncology
